# Patient Record
Sex: Female | Born: 1945
Health system: Southern US, Community
[De-identification: ages and names within clinical notes are randomized; demographics above are authoritative.]

## PROBLEM LIST (undated history)

## (undated) ENCOUNTER — Ambulatory Visit: Admission: EM | Payer: Medicare Other | Source: Home / Self Care

## (undated) DIAGNOSIS — I10 Essential (primary) hypertension: Secondary | ICD-10-CM

## (undated) DIAGNOSIS — N289 Disorder of kidney and ureter, unspecified: Secondary | ICD-10-CM

## (undated) DIAGNOSIS — E119 Type 2 diabetes mellitus without complications: Secondary | ICD-10-CM

## (undated) HISTORY — PX: CHOLECYSTECTOMY: SHX55

## (undated) HISTORY — PX: AMPUTATION TOE: SHX6595

---

## 1998-10-24 ENCOUNTER — Other Ambulatory Visit: Admission: RE | Admit: 1998-10-24 | Discharge: 1998-10-24 | Payer: Self-pay | Admitting: *Deleted

## 2005-07-27 ENCOUNTER — Encounter: Admission: RE | Admit: 2005-07-27 | Discharge: 2005-07-27 | Payer: Self-pay | Admitting: Occupational Medicine

## 2009-04-27 ENCOUNTER — Inpatient Hospital Stay (HOSPITAL_COMMUNITY): Admission: EM | Admit: 2009-04-27 | Discharge: 2009-05-12 | Payer: Self-pay | Admitting: Emergency Medicine

## 2009-04-28 ENCOUNTER — Encounter (INDEPENDENT_AMBULATORY_CARE_PROVIDER_SITE_OTHER): Payer: Self-pay | Admitting: General Surgery

## 2009-05-02 ENCOUNTER — Encounter (INDEPENDENT_AMBULATORY_CARE_PROVIDER_SITE_OTHER): Payer: Self-pay | Admitting: General Surgery

## 2010-08-08 ENCOUNTER — Ambulatory Visit (HOSPITAL_COMMUNITY)
Admission: RE | Admit: 2010-08-08 | Discharge: 2010-08-08 | Payer: Self-pay | Source: Home / Self Care | Attending: Internal Medicine | Admitting: Internal Medicine

## 2010-08-18 ENCOUNTER — Encounter
Admission: RE | Admit: 2010-08-18 | Discharge: 2010-08-18 | Payer: Self-pay | Source: Home / Self Care | Attending: Internal Medicine | Admitting: Internal Medicine

## 2010-11-02 LAB — CBC
HCT: 27.3 % — ABNORMAL LOW (ref 36.0–46.0)
HCT: 27.6 % — ABNORMAL LOW (ref 36.0–46.0)
HCT: 29.3 % — ABNORMAL LOW (ref 36.0–46.0)
HCT: 29.8 % — ABNORMAL LOW (ref 36.0–46.0)
Hemoglobin: 9.3 g/dL — ABNORMAL LOW (ref 12.0–15.0)
Hemoglobin: 9.5 g/dL — ABNORMAL LOW (ref 12.0–15.0)
Hemoglobin: 9.5 g/dL — ABNORMAL LOW (ref 12.0–15.0)
MCHC: 34.1 g/dL (ref 30.0–36.0)
MCHC: 34.2 g/dL (ref 30.0–36.0)
MCHC: 34.4 g/dL (ref 30.0–36.0)
MCHC: 34.6 g/dL (ref 30.0–36.0)
MCHC: 35.1 g/dL (ref 30.0–36.0)
MCV: 87.1 fL (ref 78.0–100.0)
MCV: 87.6 fL (ref 78.0–100.0)
MCV: 87.7 fL (ref 78.0–100.0)
MCV: 88.3 fL (ref 78.0–100.0)
MCV: 88.6 fL (ref 78.0–100.0)
Platelets: 261 10*3/uL (ref 150–400)
Platelets: 433 10*3/uL — ABNORMAL HIGH (ref 150–400)
Platelets: 459 10*3/uL — ABNORMAL HIGH (ref 150–400)
Platelets: 514 10*3/uL — ABNORMAL HIGH (ref 150–400)
Platelets: 582 10*3/uL — ABNORMAL HIGH (ref 150–400)
RBC: 3.11 MIL/uL — ABNORMAL LOW (ref 3.87–5.11)
RBC: 3.15 MIL/uL — ABNORMAL LOW (ref 3.87–5.11)
RBC: 3.32 MIL/uL — ABNORMAL LOW (ref 3.87–5.11)
RBC: 3.39 MIL/uL — ABNORMAL LOW (ref 3.87–5.11)
RDW: 12 % (ref 11.5–15.5)
RDW: 12.6 % (ref 11.5–15.5)
RDW: 12.9 % (ref 11.5–15.5)
RDW: 12.9 % (ref 11.5–15.5)
WBC: 10.5 10*3/uL (ref 4.0–10.5)
WBC: 10.6 10*3/uL — ABNORMAL HIGH (ref 4.0–10.5)
WBC: 14.1 10*3/uL — ABNORMAL HIGH (ref 4.0–10.5)
WBC: 14.7 10*3/uL — ABNORMAL HIGH (ref 4.0–10.5)

## 2010-11-02 LAB — BASIC METABOLIC PANEL
BUN: 13 mg/dL (ref 6–23)
BUN: 18 mg/dL (ref 6–23)
BUN: 4 mg/dL — ABNORMAL LOW (ref 6–23)
BUN: 4 mg/dL — ABNORMAL LOW (ref 6–23)
BUN: 6 mg/dL (ref 6–23)
BUN: 7 mg/dL (ref 6–23)
BUN: 9 mg/dL (ref 6–23)
CO2: 27 mEq/L (ref 19–32)
CO2: 27 mEq/L (ref 19–32)
CO2: 27 mEq/L (ref 19–32)
CO2: 28 mEq/L (ref 19–32)
CO2: 28 mEq/L (ref 19–32)
CO2: 29 mEq/L (ref 19–32)
Calcium: 7.9 mg/dL — ABNORMAL LOW (ref 8.4–10.5)
Calcium: 8.1 mg/dL — ABNORMAL LOW (ref 8.4–10.5)
Calcium: 8.4 mg/dL (ref 8.4–10.5)
Calcium: 9.3 mg/dL (ref 8.4–10.5)
Chloride: 101 mEq/L (ref 96–112)
Chloride: 102 mEq/L (ref 96–112)
Chloride: 103 mEq/L (ref 96–112)
Chloride: 97 mEq/L (ref 96–112)
Creatinine, Ser: 0.93 mg/dL (ref 0.4–1.2)
Creatinine, Ser: 0.93 mg/dL (ref 0.4–1.2)
Creatinine, Ser: 0.95 mg/dL (ref 0.4–1.2)
Creatinine, Ser: 0.96 mg/dL (ref 0.4–1.2)
Creatinine, Ser: 1.04 mg/dL (ref 0.4–1.2)
GFR calc Af Amer: >60 mL/min (ref >60)
GFR calc Af Amer: >60 mL/min (ref >60)
GFR calc non Af Amer: >60 mL/min (ref >60)
GFR calc non Af Amer: >60 mL/min (ref >60)
GFR calc non Af Amer: >60 mL/min (ref >60)
GFR calc non Af Amer: >60 mL/min (ref >60)
GFR calc non Af Amer: >60 mL/min (ref >60)
Glucose, Bld: 136 mg/dL — ABNORMAL HIGH (ref 70–99)
Glucose, Bld: 144 mg/dL — ABNORMAL HIGH (ref 70–99)
Glucose, Bld: 168 mg/dL — ABNORMAL HIGH (ref 70–99)
Glucose, Bld: 168 mg/dL — ABNORMAL HIGH (ref 70–99)
Glucose, Bld: 248 mg/dL — ABNORMAL HIGH (ref 70–99)
Potassium: 3.8 mEq/L (ref 3.5–5.1)
Potassium: 3.9 mEq/L (ref 3.5–5.1)
Potassium: 3.9 mEq/L (ref 3.5–5.1)
Potassium: 4 mEq/L (ref 3.5–5.1)
Potassium: 4.2 mEq/L (ref 3.5–5.1)
Sodium: 130 mEq/L — ABNORMAL LOW (ref 135–145)
Sodium: 134 mEq/L — ABNORMAL LOW (ref 135–145)
Sodium: 137 mEq/L (ref 135–145)

## 2010-11-02 LAB — DIFFERENTIAL
Basophils Absolute: 0 10*3/uL (ref 0.0–0.1)
Basophils Absolute: 0 10*3/uL (ref 0.0–0.1)
Basophils Relative: 0 % (ref 0–1)
Basophils Relative: 0 % (ref 0–1)
Basophils Relative: 0 % (ref 0–1)
Basophils Relative: 0 % (ref 0–1)
Basophils Relative: 0 % (ref 0–1)
Basophils Relative: 1 % (ref 0–1)
Eosinophils Absolute: 0.3 10*3/uL (ref 0.0–0.7)
Eosinophils Absolute: 0.3 10*3/uL (ref 0.0–0.7)
Eosinophils Absolute: 0.4 10*3/uL (ref 0.0–0.7)
Eosinophils Absolute: 0.7 10*3/uL (ref 0.0–0.7)
Eosinophils Absolute: 0.7 10*3/uL (ref 0.0–0.7)
Eosinophils Relative: 4 % (ref 0–5)
Eosinophils Relative: 4 % (ref 0–5)
Eosinophils Relative: 6 % — ABNORMAL HIGH (ref 0–5)
Eosinophils Relative: 7 % — ABNORMAL HIGH (ref 0–5)
Lymphocytes Relative: 19 % (ref 12–46)
Lymphocytes Relative: 22 % (ref 12–46)
Lymphocytes Relative: 23 % (ref 12–46)
Lymphocytes Relative: 25 % (ref 12–46)
Lymphs Abs: 2.8 10*3/uL (ref 0.7–4.0)
Lymphs Abs: 3.1 10*3/uL (ref 0.7–4.0)
Lymphs Abs: 3.2 10*3/uL (ref 0.7–4.0)
Lymphs Abs: 3.6 10*3/uL (ref 0.7–4.0)
Monocytes Absolute: 1.4 10*3/uL — ABNORMAL HIGH (ref 0.1–1.0)
Monocytes Absolute: 1.4 10*3/uL — ABNORMAL HIGH (ref 0.1–1.0)
Monocytes Absolute: 1.4 10*3/uL — ABNORMAL HIGH (ref 0.1–1.0)
Monocytes Absolute: 1.6 10*3/uL — ABNORMAL HIGH (ref 0.1–1.0)
Monocytes Relative: 10 % (ref 3–12)
Monocytes Relative: 11 % (ref 3–12)
Monocytes Relative: 12 % (ref 3–12)
Monocytes Relative: 9 % (ref 3–12)
Neutro Abs: 5.8 10*3/uL (ref 1.7–7.7)
Neutro Abs: 8.4 10*3/uL — ABNORMAL HIGH (ref 1.7–7.7)
Neutro Abs: 8.9 10*3/uL — ABNORMAL HIGH (ref 1.7–7.7)
Neutro Abs: 9.8 10*3/uL — ABNORMAL HIGH (ref 1.7–7.7)
Neutrophils Relative %: 55 % (ref 43–77)
Neutrophils Relative %: 61 % (ref 43–77)
Neutrophils Relative %: 61 % (ref 43–77)
Neutrophils Relative %: 62 % (ref 43–77)
Neutrophils Relative %: 63 % (ref 43–77)
Neutrophils Relative %: 80 % — ABNORMAL HIGH (ref 43–77)

## 2010-11-02 LAB — GLUCOSE, CAPILLARY
Glucose-Capillary: 101 mg/dL — ABNORMAL HIGH (ref 70–99)
Glucose-Capillary: 110 mg/dL — ABNORMAL HIGH (ref 70–99)
Glucose-Capillary: 110 mg/dL — ABNORMAL HIGH (ref 70–99)
Glucose-Capillary: 113 mg/dL — ABNORMAL HIGH (ref 70–99)
Glucose-Capillary: 117 mg/dL — ABNORMAL HIGH (ref 70–99)
Glucose-Capillary: 121 mg/dL — ABNORMAL HIGH (ref 70–99)
Glucose-Capillary: 122 mg/dL — ABNORMAL HIGH (ref 70–99)
Glucose-Capillary: 122 mg/dL — ABNORMAL HIGH (ref 70–99)
Glucose-Capillary: 125 mg/dL — ABNORMAL HIGH (ref 70–99)
Glucose-Capillary: 130 mg/dL — ABNORMAL HIGH (ref 70–99)
Glucose-Capillary: 134 mg/dL — ABNORMAL HIGH (ref 70–99)
Glucose-Capillary: 139 mg/dL — ABNORMAL HIGH (ref 70–99)
Glucose-Capillary: 141 mg/dL — ABNORMAL HIGH (ref 70–99)
Glucose-Capillary: 150 mg/dL — ABNORMAL HIGH (ref 70–99)
Glucose-Capillary: 158 mg/dL — ABNORMAL HIGH (ref 70–99)
Glucose-Capillary: 162 mg/dL — ABNORMAL HIGH (ref 70–99)
Glucose-Capillary: 165 mg/dL — ABNORMAL HIGH (ref 70–99)
Glucose-Capillary: 169 mg/dL — ABNORMAL HIGH (ref 70–99)
Glucose-Capillary: 173 mg/dL — ABNORMAL HIGH (ref 70–99)
Glucose-Capillary: 176 mg/dL — ABNORMAL HIGH (ref 70–99)
Glucose-Capillary: 177 mg/dL — ABNORMAL HIGH (ref 70–99)
Glucose-Capillary: 180 mg/dL — ABNORMAL HIGH (ref 70–99)
Glucose-Capillary: 185 mg/dL — ABNORMAL HIGH (ref 70–99)
Glucose-Capillary: 196 mg/dL — ABNORMAL HIGH (ref 70–99)
Glucose-Capillary: 211 mg/dL — ABNORMAL HIGH (ref 70–99)
Glucose-Capillary: 225 mg/dL — ABNORMAL HIGH (ref 70–99)
Glucose-Capillary: 72 mg/dL (ref 70–99)
Glucose-Capillary: 76 mg/dL (ref 70–99)
Glucose-Capillary: 82 mg/dL (ref 70–99)
Glucose-Capillary: 91 mg/dL (ref 70–99)
Glucose-Capillary: 94 mg/dL (ref 70–99)

## 2010-11-02 LAB — CROSSMATCH

## 2010-11-02 LAB — HEPATIC FUNCTION PANEL
Albumin: 1.9 g/dL — ABNORMAL LOW (ref 3.5–5.2)
Albumin: 2.5 g/dL — ABNORMAL LOW (ref 3.5–5.2)
Alkaline Phosphatase: 48 U/L (ref 39–117)
Indirect Bilirubin: 0.5 mg/dL (ref 0.3–0.9)
Total Bilirubin: 0.7 mg/dL (ref 0.3–1.2)
Total Protein: 5.5 g/dL — ABNORMAL LOW (ref 6.0–8.3)
Total Protein: 7.2 g/dL (ref 6.0–8.3)

## 2010-11-02 LAB — ABO/RH: ABO/RH(D): A POS

## 2010-11-02 LAB — HEMOGLOBIN AND HEMATOCRIT, BLOOD
HCT: 30.5 % — ABNORMAL LOW (ref 36.0–46.0)
Hemoglobin: 10.6 g/dL — ABNORMAL LOW (ref 12.0–15.0)

## 2010-11-03 LAB — GLUCOSE, CAPILLARY
Glucose-Capillary: 205 mg/dL — ABNORMAL HIGH (ref 70–99)
Glucose-Capillary: 266 mg/dL — ABNORMAL HIGH (ref 70–99)
Glucose-Capillary: 269 mg/dL — ABNORMAL HIGH (ref 70–99)
Glucose-Capillary: 281 mg/dL — ABNORMAL HIGH (ref 70–99)
Glucose-Capillary: 297 mg/dL — ABNORMAL HIGH (ref 70–99)
Glucose-Capillary: 304 mg/dL — ABNORMAL HIGH (ref 70–99)
Glucose-Capillary: 356 mg/dL — ABNORMAL HIGH (ref 70–99)
Glucose-Capillary: 366 mg/dL — ABNORMAL HIGH (ref 70–99)
Glucose-Capillary: 393 mg/dL — ABNORMAL HIGH (ref 70–99)

## 2010-11-03 LAB — BASIC METABOLIC PANEL
BUN: 13 mg/dL (ref 6–23)
BUN: 17 mg/dL (ref 6–23)
Chloride: 96 mEq/L (ref 96–112)
GFR calc non Af Amer: 52 mL/min — ABNORMAL LOW (ref >60)
GFR calc non Af Amer: 58 mL/min — ABNORMAL LOW (ref >60)
Potassium: 3.7 mEq/L (ref 3.5–5.1)
Potassium: 4.4 mEq/L (ref 3.5–5.1)
Sodium: 130 mEq/L — ABNORMAL LOW (ref 135–145)
Sodium: 130 mEq/L — ABNORMAL LOW (ref 135–145)

## 2010-11-03 LAB — DIFFERENTIAL
Eosinophils Absolute: 0 10*3/uL (ref 0.0–0.7)
Eosinophils Relative: 0 % (ref 0–5)
Eosinophils Relative: 0 % (ref 0–5)
Lymphocytes Relative: 6 % — ABNORMAL LOW (ref 12–46)
Lymphocytes Relative: 6 % — ABNORMAL LOW (ref 12–46)
Lymphs Abs: 1.2 10*3/uL (ref 0.7–4.0)
Lymphs Abs: 1.5 10*3/uL (ref 0.7–4.0)
Monocytes Absolute: 1.8 10*3/uL — ABNORMAL HIGH (ref 0.1–1.0)
Monocytes Relative: 7 % (ref 3–12)

## 2010-11-03 LAB — WOUND CULTURE

## 2010-11-03 LAB — CULTURE, BLOOD (ROUTINE X 2)
Culture: NO GROWTH
Report Status: 10042010
Report Status: 10042010

## 2010-11-03 LAB — CBC
HCT: 34.5 % — ABNORMAL LOW (ref 36.0–46.0)
HCT: 38.3 % (ref 36.0–46.0)
Hemoglobin: 12.3 g/dL (ref 12.0–15.0)
Hemoglobin: 13.1 g/dL (ref 12.0–15.0)
MCV: 87.4 fL (ref 78.0–100.0)
Platelets: 269 10*3/uL (ref 150–400)
Platelets: 294 10*3/uL (ref 150–400)
RBC: 3.95 MIL/uL (ref 3.87–5.11)
WBC: 20.7 10*3/uL — ABNORMAL HIGH (ref 4.0–10.5)
WBC: 26.6 10*3/uL — ABNORMAL HIGH (ref 4.0–10.5)

## 2010-11-03 LAB — HEMOGLOBIN A1C: Hgb A1c MFr Bld: 13.4 % — ABNORMAL HIGH (ref 4.6–6.1)

## 2010-11-03 LAB — ANAEROBIC CULTURE

## 2011-04-06 ENCOUNTER — Ambulatory Visit (INDEPENDENT_AMBULATORY_CARE_PROVIDER_SITE_OTHER): Payer: Commercial Managed Care - PPO | Admitting: Ophthalmology

## 2011-04-06 DIAGNOSIS — E11319 Type 2 diabetes mellitus with unspecified diabetic retinopathy without macular edema: Secondary | ICD-10-CM

## 2011-04-06 DIAGNOSIS — H43819 Vitreous degeneration, unspecified eye: Secondary | ICD-10-CM

## 2011-04-06 DIAGNOSIS — H35379 Puckering of macula, unspecified eye: Secondary | ICD-10-CM

## 2011-04-06 DIAGNOSIS — H2510 Age-related nuclear cataract, unspecified eye: Secondary | ICD-10-CM

## 2011-09-10 ENCOUNTER — Other Ambulatory Visit (HOSPITAL_COMMUNITY): Payer: Self-pay | Admitting: Internal Medicine

## 2011-09-10 DIAGNOSIS — Z139 Encounter for screening, unspecified: Secondary | ICD-10-CM

## 2011-09-21 ENCOUNTER — Ambulatory Visit (HOSPITAL_COMMUNITY): Payer: Commercial Managed Care - PPO

## 2011-09-28 ENCOUNTER — Ambulatory Visit (HOSPITAL_COMMUNITY)
Admission: RE | Admit: 2011-09-28 | Discharge: 2011-09-28 | Disposition: A | Discharge status: Home / Self Care | Payer: Commercial Managed Care - PPO | Source: Ambulatory Visit | Attending: Internal Medicine | Admitting: Internal Medicine

## 2011-09-28 DIAGNOSIS — Z139 Encounter for screening, unspecified: Secondary | ICD-10-CM

## 2011-09-28 DIAGNOSIS — Z1231 Encounter for screening mammogram for malignant neoplasm of breast: Secondary | ICD-10-CM | POA: Insufficient documentation

## 2011-10-05 ENCOUNTER — Ambulatory Visit (INDEPENDENT_AMBULATORY_CARE_PROVIDER_SITE_OTHER): Payer: Commercial Managed Care - PPO | Admitting: Ophthalmology

## 2011-10-05 DIAGNOSIS — E1139 Type 2 diabetes mellitus with other diabetic ophthalmic complication: Secondary | ICD-10-CM

## 2011-10-05 DIAGNOSIS — H2510 Age-related nuclear cataract, unspecified eye: Secondary | ICD-10-CM

## 2011-10-05 DIAGNOSIS — H35379 Puckering of macula, unspecified eye: Secondary | ICD-10-CM

## 2011-10-05 DIAGNOSIS — E11319 Type 2 diabetes mellitus with unspecified diabetic retinopathy without macular edema: Secondary | ICD-10-CM

## 2011-10-05 DIAGNOSIS — E1165 Type 2 diabetes mellitus with hyperglycemia: Secondary | ICD-10-CM

## 2011-10-05 DIAGNOSIS — H43819 Vitreous degeneration, unspecified eye: Secondary | ICD-10-CM

## 2012-04-11 ENCOUNTER — Ambulatory Visit (INDEPENDENT_AMBULATORY_CARE_PROVIDER_SITE_OTHER): Payer: Commercial Managed Care - PPO | Admitting: Ophthalmology

## 2012-04-11 DIAGNOSIS — H35379 Puckering of macula, unspecified eye: Secondary | ICD-10-CM

## 2012-04-11 DIAGNOSIS — E1139 Type 2 diabetes mellitus with other diabetic ophthalmic complication: Secondary | ICD-10-CM

## 2012-04-11 DIAGNOSIS — E1165 Type 2 diabetes mellitus with hyperglycemia: Secondary | ICD-10-CM

## 2012-04-11 DIAGNOSIS — H3581 Retinal edema: Secondary | ICD-10-CM

## 2012-04-11 DIAGNOSIS — H35039 Hypertensive retinopathy, unspecified eye: Secondary | ICD-10-CM

## 2012-04-11 DIAGNOSIS — I10 Essential (primary) hypertension: Secondary | ICD-10-CM

## 2012-04-11 DIAGNOSIS — E11319 Type 2 diabetes mellitus with unspecified diabetic retinopathy without macular edema: Secondary | ICD-10-CM

## 2012-08-22 ENCOUNTER — Ambulatory Visit (INDEPENDENT_AMBULATORY_CARE_PROVIDER_SITE_OTHER): Payer: Commercial Managed Care - PPO | Admitting: Ophthalmology

## 2012-08-22 DIAGNOSIS — H2510 Age-related nuclear cataract, unspecified eye: Secondary | ICD-10-CM

## 2012-08-22 DIAGNOSIS — H43819 Vitreous degeneration, unspecified eye: Secondary | ICD-10-CM

## 2012-08-22 DIAGNOSIS — E1165 Type 2 diabetes mellitus with hyperglycemia: Secondary | ICD-10-CM

## 2012-08-22 DIAGNOSIS — I10 Essential (primary) hypertension: Secondary | ICD-10-CM

## 2012-08-22 DIAGNOSIS — E11319 Type 2 diabetes mellitus with unspecified diabetic retinopathy without macular edema: Secondary | ICD-10-CM

## 2012-08-22 DIAGNOSIS — H35039 Hypertensive retinopathy, unspecified eye: Secondary | ICD-10-CM

## 2012-08-22 DIAGNOSIS — H3581 Retinal edema: Secondary | ICD-10-CM

## 2012-08-22 DIAGNOSIS — E1139 Type 2 diabetes mellitus with other diabetic ophthalmic complication: Secondary | ICD-10-CM

## 2012-12-23 ENCOUNTER — Ambulatory Visit (INDEPENDENT_AMBULATORY_CARE_PROVIDER_SITE_OTHER): Payer: Commercial Managed Care - PPO | Admitting: Ophthalmology

## 2012-12-23 DIAGNOSIS — H2510 Age-related nuclear cataract, unspecified eye: Secondary | ICD-10-CM

## 2012-12-23 DIAGNOSIS — E11319 Type 2 diabetes mellitus with unspecified diabetic retinopathy without macular edema: Secondary | ICD-10-CM

## 2012-12-23 DIAGNOSIS — I10 Essential (primary) hypertension: Secondary | ICD-10-CM

## 2012-12-23 DIAGNOSIS — H35039 Hypertensive retinopathy, unspecified eye: Secondary | ICD-10-CM

## 2012-12-23 DIAGNOSIS — H43819 Vitreous degeneration, unspecified eye: Secondary | ICD-10-CM

## 2012-12-23 DIAGNOSIS — E1165 Type 2 diabetes mellitus with hyperglycemia: Secondary | ICD-10-CM

## 2013-06-30 ENCOUNTER — Ambulatory Visit (INDEPENDENT_AMBULATORY_CARE_PROVIDER_SITE_OTHER): Payer: Medicare Other | Admitting: Ophthalmology

## 2013-06-30 DIAGNOSIS — E11319 Type 2 diabetes mellitus with unspecified diabetic retinopathy without macular edema: Secondary | ICD-10-CM

## 2013-06-30 DIAGNOSIS — H43819 Vitreous degeneration, unspecified eye: Secondary | ICD-10-CM

## 2013-06-30 DIAGNOSIS — E1139 Type 2 diabetes mellitus with other diabetic ophthalmic complication: Secondary | ICD-10-CM

## 2013-06-30 DIAGNOSIS — H35039 Hypertensive retinopathy, unspecified eye: Secondary | ICD-10-CM

## 2013-06-30 DIAGNOSIS — I10 Essential (primary) hypertension: Secondary | ICD-10-CM

## 2013-11-18 DIAGNOSIS — S53449A Ulnar collateral ligament sprain of unspecified elbow, initial encounter: Secondary | ICD-10-CM | POA: Diagnosis not present

## 2013-12-15 DIAGNOSIS — L821 Other seborrheic keratosis: Secondary | ICD-10-CM | POA: Diagnosis not present

## 2013-12-15 DIAGNOSIS — C44519 Basal cell carcinoma of skin of other part of trunk: Secondary | ICD-10-CM | POA: Diagnosis not present

## 2013-12-15 DIAGNOSIS — D235 Other benign neoplasm of skin of trunk: Secondary | ICD-10-CM | POA: Diagnosis not present

## 2013-12-15 DIAGNOSIS — D485 Neoplasm of uncertain behavior of skin: Secondary | ICD-10-CM | POA: Diagnosis not present

## 2013-12-23 DIAGNOSIS — C44519 Basal cell carcinoma of skin of other part of trunk: Secondary | ICD-10-CM | POA: Diagnosis not present

## 2013-12-29 ENCOUNTER — Ambulatory Visit (INDEPENDENT_AMBULATORY_CARE_PROVIDER_SITE_OTHER): Payer: Medicare Other | Admitting: Ophthalmology

## 2014-01-05 ENCOUNTER — Ambulatory Visit (INDEPENDENT_AMBULATORY_CARE_PROVIDER_SITE_OTHER): Payer: Medicare Other | Admitting: Ophthalmology

## 2014-01-05 DIAGNOSIS — E11319 Type 2 diabetes mellitus with unspecified diabetic retinopathy without macular edema: Secondary | ICD-10-CM | POA: Diagnosis not present

## 2014-01-05 DIAGNOSIS — H43819 Vitreous degeneration, unspecified eye: Secondary | ICD-10-CM

## 2014-01-05 DIAGNOSIS — I10 Essential (primary) hypertension: Secondary | ICD-10-CM | POA: Diagnosis not present

## 2014-01-05 DIAGNOSIS — H35039 Hypertensive retinopathy, unspecified eye: Secondary | ICD-10-CM | POA: Diagnosis not present

## 2014-01-05 DIAGNOSIS — E1139 Type 2 diabetes mellitus with other diabetic ophthalmic complication: Secondary | ICD-10-CM | POA: Diagnosis not present

## 2014-01-05 DIAGNOSIS — E1165 Type 2 diabetes mellitus with hyperglycemia: Secondary | ICD-10-CM | POA: Diagnosis not present

## 2014-01-05 DIAGNOSIS — H2510 Age-related nuclear cataract, unspecified eye: Secondary | ICD-10-CM

## 2014-06-29 ENCOUNTER — Ambulatory Visit (INDEPENDENT_AMBULATORY_CARE_PROVIDER_SITE_OTHER): Payer: Medicare Other | Admitting: Ophthalmology

## 2014-06-29 DIAGNOSIS — E11319 Type 2 diabetes mellitus with unspecified diabetic retinopathy without macular edema: Secondary | ICD-10-CM

## 2014-06-29 DIAGNOSIS — I10 Essential (primary) hypertension: Secondary | ICD-10-CM | POA: Diagnosis not present

## 2014-06-29 DIAGNOSIS — E11339 Type 2 diabetes mellitus with moderate nonproliferative diabetic retinopathy without macular edema: Secondary | ICD-10-CM

## 2014-06-29 DIAGNOSIS — H43813 Vitreous degeneration, bilateral: Secondary | ICD-10-CM | POA: Diagnosis not present

## 2014-06-29 DIAGNOSIS — H35033 Hypertensive retinopathy, bilateral: Secondary | ICD-10-CM

## 2014-06-30 DIAGNOSIS — D485 Neoplasm of uncertain behavior of skin: Secondary | ICD-10-CM | POA: Diagnosis not present

## 2014-06-30 DIAGNOSIS — D2262 Melanocytic nevi of left upper limb, including shoulder: Secondary | ICD-10-CM | POA: Diagnosis not present

## 2014-06-30 DIAGNOSIS — Z85828 Personal history of other malignant neoplasm of skin: Secondary | ICD-10-CM | POA: Diagnosis not present

## 2014-06-30 DIAGNOSIS — L570 Actinic keratosis: Secondary | ICD-10-CM | POA: Diagnosis not present

## 2014-07-07 ENCOUNTER — Ambulatory Visit (INDEPENDENT_AMBULATORY_CARE_PROVIDER_SITE_OTHER): Payer: Medicare Other | Admitting: Ophthalmology

## 2014-10-12 DIAGNOSIS — E119 Type 2 diabetes mellitus without complications: Secondary | ICD-10-CM | POA: Diagnosis not present

## 2014-10-12 DIAGNOSIS — E782 Mixed hyperlipidemia: Secondary | ICD-10-CM | POA: Diagnosis not present

## 2014-10-15 DIAGNOSIS — E782 Mixed hyperlipidemia: Secondary | ICD-10-CM | POA: Diagnosis not present

## 2014-10-15 DIAGNOSIS — R809 Proteinuria, unspecified: Secondary | ICD-10-CM | POA: Diagnosis not present

## 2014-10-15 DIAGNOSIS — E1122 Type 2 diabetes mellitus with diabetic chronic kidney disease: Secondary | ICD-10-CM | POA: Diagnosis not present

## 2014-10-15 DIAGNOSIS — N183 Chronic kidney disease, stage 3 (moderate): Secondary | ICD-10-CM | POA: Diagnosis not present

## 2014-12-22 DIAGNOSIS — E1165 Type 2 diabetes mellitus with hyperglycemia: Secondary | ICD-10-CM | POA: Diagnosis not present

## 2014-12-22 DIAGNOSIS — G4700 Insomnia, unspecified: Secondary | ICD-10-CM | POA: Diagnosis not present

## 2014-12-22 DIAGNOSIS — E119 Type 2 diabetes mellitus without complications: Secondary | ICD-10-CM | POA: Diagnosis not present

## 2014-12-22 DIAGNOSIS — I10 Essential (primary) hypertension: Secondary | ICD-10-CM | POA: Diagnosis not present

## 2014-12-29 ENCOUNTER — Ambulatory Visit (INDEPENDENT_AMBULATORY_CARE_PROVIDER_SITE_OTHER): Payer: Medicare Other | Admitting: Ophthalmology

## 2015-01-03 DIAGNOSIS — L821 Other seborrheic keratosis: Secondary | ICD-10-CM | POA: Diagnosis not present

## 2015-01-03 DIAGNOSIS — L570 Actinic keratosis: Secondary | ICD-10-CM | POA: Diagnosis not present

## 2015-01-03 DIAGNOSIS — Z85828 Personal history of other malignant neoplasm of skin: Secondary | ICD-10-CM | POA: Diagnosis not present

## 2015-01-11 ENCOUNTER — Ambulatory Visit (INDEPENDENT_AMBULATORY_CARE_PROVIDER_SITE_OTHER): Payer: Medicare Other | Admitting: Ophthalmology

## 2015-01-11 DIAGNOSIS — H35033 Hypertensive retinopathy, bilateral: Secondary | ICD-10-CM | POA: Diagnosis not present

## 2015-01-11 DIAGNOSIS — H43813 Vitreous degeneration, bilateral: Secondary | ICD-10-CM

## 2015-01-11 DIAGNOSIS — E11319 Type 2 diabetes mellitus with unspecified diabetic retinopathy without macular edema: Secondary | ICD-10-CM

## 2015-01-11 DIAGNOSIS — H2513 Age-related nuclear cataract, bilateral: Secondary | ICD-10-CM

## 2015-01-11 DIAGNOSIS — I10 Essential (primary) hypertension: Secondary | ICD-10-CM

## 2015-01-11 DIAGNOSIS — E11339 Type 2 diabetes mellitus with moderate nonproliferative diabetic retinopathy without macular edema: Secondary | ICD-10-CM | POA: Diagnosis not present

## 2015-02-16 DIAGNOSIS — I10 Essential (primary) hypertension: Secondary | ICD-10-CM | POA: Diagnosis not present

## 2015-02-16 DIAGNOSIS — E1165 Type 2 diabetes mellitus with hyperglycemia: Secondary | ICD-10-CM | POA: Diagnosis not present

## 2015-02-16 DIAGNOSIS — E782 Mixed hyperlipidemia: Secondary | ICD-10-CM | POA: Diagnosis not present

## 2015-02-23 DIAGNOSIS — I10 Essential (primary) hypertension: Secondary | ICD-10-CM | POA: Diagnosis not present

## 2015-02-23 DIAGNOSIS — E119 Type 2 diabetes mellitus without complications: Secondary | ICD-10-CM | POA: Diagnosis not present

## 2015-03-02 DIAGNOSIS — E1165 Type 2 diabetes mellitus with hyperglycemia: Secondary | ICD-10-CM | POA: Diagnosis not present

## 2015-03-30 DIAGNOSIS — E1359 Other specified diabetes mellitus with other circulatory complications: Secondary | ICD-10-CM | POA: Diagnosis not present

## 2015-03-30 DIAGNOSIS — R2689 Other abnormalities of gait and mobility: Secondary | ICD-10-CM | POA: Diagnosis not present

## 2015-03-30 DIAGNOSIS — L602 Onychogryphosis: Secondary | ICD-10-CM | POA: Diagnosis not present

## 2015-03-30 DIAGNOSIS — L608 Other nail disorders: Secondary | ICD-10-CM | POA: Diagnosis not present

## 2015-03-30 DIAGNOSIS — B351 Tinea unguium: Secondary | ICD-10-CM | POA: Diagnosis not present

## 2015-04-21 DIAGNOSIS — H5213 Myopia, bilateral: Secondary | ICD-10-CM | POA: Diagnosis not present

## 2015-04-21 DIAGNOSIS — E11339 Type 2 diabetes mellitus with moderate nonproliferative diabetic retinopathy without macular edema: Secondary | ICD-10-CM | POA: Diagnosis not present

## 2015-04-21 DIAGNOSIS — H52223 Regular astigmatism, bilateral: Secondary | ICD-10-CM | POA: Diagnosis not present

## 2015-04-21 DIAGNOSIS — Z794 Long term (current) use of insulin: Secondary | ICD-10-CM | POA: Diagnosis not present

## 2015-05-25 DIAGNOSIS — E1165 Type 2 diabetes mellitus with hyperglycemia: Secondary | ICD-10-CM | POA: Diagnosis not present

## 2015-06-08 DIAGNOSIS — E782 Mixed hyperlipidemia: Secondary | ICD-10-CM | POA: Diagnosis not present

## 2015-06-08 DIAGNOSIS — E1122 Type 2 diabetes mellitus with diabetic chronic kidney disease: Secondary | ICD-10-CM | POA: Diagnosis not present

## 2015-06-08 DIAGNOSIS — I10 Essential (primary) hypertension: Secondary | ICD-10-CM | POA: Diagnosis not present

## 2015-06-08 DIAGNOSIS — F411 Generalized anxiety disorder: Secondary | ICD-10-CM | POA: Diagnosis not present

## 2015-06-08 DIAGNOSIS — Z23 Encounter for immunization: Secondary | ICD-10-CM | POA: Diagnosis not present

## 2015-06-15 DIAGNOSIS — R944 Abnormal results of kidney function studies: Secondary | ICD-10-CM | POA: Diagnosis not present

## 2015-07-13 ENCOUNTER — Ambulatory Visit (INDEPENDENT_AMBULATORY_CARE_PROVIDER_SITE_OTHER): Payer: Medicare Other | Admitting: Ophthalmology

## 2015-07-13 DIAGNOSIS — E113393 Type 2 diabetes mellitus with moderate nonproliferative diabetic retinopathy without macular edema, bilateral: Secondary | ICD-10-CM

## 2015-07-13 DIAGNOSIS — H43813 Vitreous degeneration, bilateral: Secondary | ICD-10-CM

## 2015-07-13 DIAGNOSIS — I10 Essential (primary) hypertension: Secondary | ICD-10-CM

## 2015-07-13 DIAGNOSIS — H2513 Age-related nuclear cataract, bilateral: Secondary | ICD-10-CM | POA: Diagnosis not present

## 2015-07-13 DIAGNOSIS — H35033 Hypertensive retinopathy, bilateral: Secondary | ICD-10-CM | POA: Diagnosis not present

## 2015-07-13 DIAGNOSIS — E11319 Type 2 diabetes mellitus with unspecified diabetic retinopathy without macular edema: Secondary | ICD-10-CM

## 2015-07-20 DIAGNOSIS — I10 Essential (primary) hypertension: Secondary | ICD-10-CM | POA: Diagnosis not present

## 2015-07-20 DIAGNOSIS — F411 Generalized anxiety disorder: Secondary | ICD-10-CM | POA: Diagnosis not present

## 2015-07-20 DIAGNOSIS — E1122 Type 2 diabetes mellitus with diabetic chronic kidney disease: Secondary | ICD-10-CM | POA: Diagnosis not present

## 2015-07-20 DIAGNOSIS — E119 Type 2 diabetes mellitus without complications: Secondary | ICD-10-CM | POA: Diagnosis not present

## 2015-07-20 DIAGNOSIS — E782 Mixed hyperlipidemia: Secondary | ICD-10-CM | POA: Diagnosis not present

## 2015-09-10 ENCOUNTER — Emergency Department (HOSPITAL_COMMUNITY)
Admission: EM | Admit: 2015-09-10 | Discharge: 2015-09-10 | Disposition: A | Discharge status: Home / Self Care | Payer: Medicare Other | Attending: Emergency Medicine | Admitting: Emergency Medicine

## 2015-09-10 ENCOUNTER — Encounter (HOSPITAL_COMMUNITY): Payer: Self-pay | Admitting: Emergency Medicine

## 2015-09-10 ENCOUNTER — Emergency Department (HOSPITAL_COMMUNITY): Payer: Medicare Other

## 2015-09-10 DIAGNOSIS — M1711 Unilateral primary osteoarthritis, right knee: Secondary | ICD-10-CM | POA: Diagnosis not present

## 2015-09-10 DIAGNOSIS — M179 Osteoarthritis of knee, unspecified: Secondary | ICD-10-CM | POA: Diagnosis not present

## 2015-09-10 DIAGNOSIS — E119 Type 2 diabetes mellitus without complications: Secondary | ICD-10-CM | POA: Diagnosis not present

## 2015-09-10 DIAGNOSIS — Z79899 Other long term (current) drug therapy: Secondary | ICD-10-CM | POA: Insufficient documentation

## 2015-09-10 DIAGNOSIS — E876 Hypokalemia: Secondary | ICD-10-CM | POA: Diagnosis not present

## 2015-09-10 DIAGNOSIS — Z794 Long term (current) use of insulin: Secondary | ICD-10-CM | POA: Insufficient documentation

## 2015-09-10 DIAGNOSIS — N289 Disorder of kidney and ureter, unspecified: Secondary | ICD-10-CM | POA: Diagnosis not present

## 2015-09-10 DIAGNOSIS — Z7982 Long term (current) use of aspirin: Secondary | ICD-10-CM | POA: Diagnosis not present

## 2015-09-10 DIAGNOSIS — M19071 Primary osteoarthritis, right ankle and foot: Secondary | ICD-10-CM | POA: Diagnosis not present

## 2015-09-10 DIAGNOSIS — I10 Essential (primary) hypertension: Secondary | ICD-10-CM | POA: Insufficient documentation

## 2015-09-10 DIAGNOSIS — M79604 Pain in right leg: Secondary | ICD-10-CM | POA: Diagnosis present

## 2015-09-10 HISTORY — DX: Essential (primary) hypertension: I10

## 2015-09-10 HISTORY — DX: Type 2 diabetes mellitus without complications: E11.9

## 2015-09-10 LAB — BASIC METABOLIC PANEL
Anion gap: 9 (ref 5–15)
BUN: 41 mg/dL — AB (ref 6–20)
CO2: 22 mmol/L (ref 22–32)
CREATININE: 1.87 mg/dL — AB (ref 0.44–1.00)
Calcium: 9.4 mg/dL (ref 8.9–10.3)
Chloride: 109 mmol/L (ref 101–111)
GFR calc Af Amer: 31 mL/min — ABNORMAL LOW (ref >60)
GFR, EST NON AFRICAN AMERICAN: 27 mL/min — AB (ref >60)
GLUCOSE: 86 mg/dL (ref 65–99)
POTASSIUM: 5.4 mmol/L — AB (ref 3.5–5.1)
SODIUM: 140 mmol/L (ref 135–145)

## 2015-09-10 LAB — CBC
HEMATOCRIT: 34.9 % — AB (ref 36.0–46.0)
Hemoglobin: 11.3 g/dL — ABNORMAL LOW (ref 12.0–15.0)
MCH: 29.7 pg (ref 26.0–34.0)
MCHC: 32.4 g/dL (ref 30.0–36.0)
MCV: 91.6 fL (ref 78.0–100.0)
Platelets: 257 10*3/uL (ref 150–400)
RBC: 3.81 MIL/uL — ABNORMAL LOW (ref 3.87–5.11)
RDW: 13.6 % (ref 11.5–15.5)
WBC: 8.5 10*3/uL (ref 4.0–10.5)

## 2015-09-10 LAB — D-DIMER, QUANTITATIVE: D-Dimer, Quant: 0.6 ug/mL-FEU — ABNORMAL HIGH (ref 0.00–0.50)

## 2015-09-10 MED ORDER — HYDROCODONE-ACETAMINOPHEN 5-325 MG PO TABS: 1.0000 | ORAL_TABLET | ORAL | Status: DC | PRN

## 2015-09-10 MED ORDER — HYDROCODONE-ACETAMINOPHEN 5-325 MG PO TABS
1.0000 | ORAL_TABLET | ORAL | Status: AC
  Administered 2015-09-10: 1 via ORAL
  Filled 2015-09-10: qty 1

## 2015-09-10 NOTE — ED Provider Notes (Signed)
CSN: [id number]     Arrival date & time 09/10/15  1721 History   First MD Initiated Contact with Patient 09/10/15 1951     Chief Complaint  Patient presents with  . Leg Swelling  . Leg Pain    HPI Patient presents to the emergency room with complaints of right leg pain. Symptoms started a few days ago. She's having sharp pain in the anterior aspect of her right lower leg below the knee. The pain increases with walking. She denies any swelling. She denies any fevers. No chest pain or shortness of breath. She does not recall any injuries. Patient has been taking Aleve but the pain persists. Past Medical History  Diagnosis Date  . Diabetes mellitus without complication ([place])   . Hypertension    Past Surgical History  Procedure Laterality Date  . Amputation toe     History reviewed. No pertinent family history. Social History  Substance Use Topics  . Smoking status: Never Smoker   . Smokeless tobacco: None  . Alcohol Use: No   OB History    No data available     Review of Systems  All other systems reviewed and are negative.     Allergies  Review of patient's allergies indicates no known allergies.  Home Medications   Prior to Admission medications   Medication Sig Start Date End Date Taking? Authorizing Provider  amLODipine-valsartan (EXFORGE) 10-160 MG tablet Take 1 tablet by mouth daily.   Yes Historical Provider, MD  aspirin EC 81 MG tablet Take 81 mg by mouth daily.   Yes Historical Provider, MD  insulin glargine (LANTUS) 100 UNIT/ML injection Inject 40 Units into the skin at bedtime.   Yes Historical Provider, MD  metoprolol (LOPRESSOR) 50 MG tablet Take 50 mg by mouth daily.   Yes Historical Provider, MD  Multiple Vitamins-Minerals (PRESERVISION AREDS PO) Take 1 tablet by mouth daily.   Yes Historical Provider, MD  naproxen sodium (ALEVE) 220 MG tablet Take 440 mg by mouth 2 (two) times daily as needed (Pain).   Yes Historical Provider, MD  pioglitazone (ACTOS) 15  MG tablet Take 15 mg by mouth daily.   Yes Historical Provider, MD  pravastatin (PRAVACHOL) 40 MG tablet Take 40 mg by mouth daily.   Yes Historical Provider, MD  HYDROcodone-acetaminophen (NORCO/VICODIN) 5-325 MG tablet Take 1 tablet by mouth every 4 (four) hours as needed. 09/10/15   Dorie Rank, MD   BP 151/53 mmHg  Pulse 57  Temp(Src) 98.1 F (36.7 C) (Oral)  Resp 16  Ht 5\' 5"  (1.651 m)  Wt 106.595 kg  BMI 39.11 kg/m2  SpO2 100% Physical Exam  Constitutional: She appears well-developed and well-nourished. No distress.  HENT:  Head: Normocephalic and atraumatic.  Right Ear: External ear normal.  Left Ear: External ear normal.  Eyes: Conjunctivae are normal. Right eye exhibits no discharge. Left eye exhibits no discharge. No scleral icterus.  Neck: Neck supple. No tracheal deviation present.  Cardiovascular: Normal rate.   Pulmonary/Chest: Effort normal. No stridor. No respiratory distress.  Musculoskeletal: She exhibits tenderness. She exhibits no edema.       Right lower leg: She exhibits bony tenderness. She exhibits no swelling, no edema and no deformity.  Tenderness to palpation in the anterior aspect of the right lower leg adjacent to the tibia, no calf tenderness, no swelling, extremities warm and well perfused  Neurological: She is alert. Cranial nerve deficit: no gross deficits.  Skin: Skin is warm and dry. No rash noted.  Psychiatric: She has a normal mood and affect.  Nursing note and vitals reviewed.   ED Course  Procedures (including critical care time) Labs Review Labs Reviewed  BASIC METABOLIC PANEL - Abnormal; Notable for the following:    Potassium 5.4 (*)    BUN 41 (*)    Creatinine, Ser 1.87 (*)    GFR calc non Af Amer 27 (*)    GFR calc Af Amer 31 (*)    All other components within normal limits  CBC - Abnormal; Notable for the following:    RBC 3.81 (*)    Hemoglobin 11.3 (*)    HCT 34.9 (*)    All other components within normal limits  D-DIMER,  QUANTITATIVE (NOT AT [facility]) - Abnormal; Notable for the following:    D-Dimer, Quant 0.60 (*)    All other components within normal limits    Imaging Review Dg Tibia/fibula Right  09/10/2015  CLINICAL DATA:  70 year old with 4 day history of pain involving the right mid lower leg. No known injury. EXAM: RIGHT TIBIA AND FIBULA - 2 VIEW COMPARISON:  None. FINDINGS: No evidence of acute, subacute or healed fractures. Mild osseous demineralization. No other intrinsic osseous abnormalities involving the tibia or fibula. Severe tricompartment osteoarthritis involving the knee. Mild degenerative changes involving the ankle. IMPRESSION: 1. No acute osseous abnormality. 2. Severe tricompartment osteoarthritis involving the right knee. Mild degenerative changes involving the right ankle. Electronically Signed   By: Evangeline Dakin M.D.   On: 09/10/2015 20:33   I have personally reviewed and evaluated these images and lab results as part of my medical decision-making.   EKG Interpretation None      MDM   Final diagnoses:  Osteoarthritis of right knee, unspecified osteoarthritis type  Renal insufficiency    Patient's laboratory tests show renal sufficiency and a mild hypokalemia. I don't have any recent labs for comparison. This is most likely chronic however I will have her cut back on taking Aleve as this could be contributing to renal sufficiency.  Patient's age adjusted d-dimer is negative. Her x-rays show osteoarthritis. I think her pain in her leg is related to the osteoarthritis findings. Plan on discharge home with prescription for hydrocodone.      Dorie Rank, MD 09/10/15 2137

## 2015-09-10 NOTE — ED Notes (Signed)
Pt states that her right leg has been swelling and hurting from the knee down for the past few days.  States it has become painful to walk on it.

## 2015-09-10 NOTE — Discharge Instructions (Signed)

## 2015-09-27 DIAGNOSIS — M79675 Pain in left toe(s): Secondary | ICD-10-CM | POA: Diagnosis not present

## 2015-09-27 DIAGNOSIS — S90212A Contusion of left great toe with damage to nail, initial encounter: Secondary | ICD-10-CM | POA: Diagnosis not present

## 2015-10-13 DIAGNOSIS — L03032 Cellulitis of left toe: Secondary | ICD-10-CM | POA: Diagnosis not present

## 2015-10-13 DIAGNOSIS — M79672 Pain in left foot: Secondary | ICD-10-CM | POA: Diagnosis not present

## 2015-10-13 DIAGNOSIS — L600 Ingrowing nail: Secondary | ICD-10-CM | POA: Diagnosis not present

## 2015-10-13 DIAGNOSIS — M79675 Pain in left toe(s): Secondary | ICD-10-CM | POA: Diagnosis not present

## 2015-12-29 DIAGNOSIS — L600 Ingrowing nail: Secondary | ICD-10-CM | POA: Diagnosis not present

## 2015-12-29 DIAGNOSIS — M79672 Pain in left foot: Secondary | ICD-10-CM | POA: Diagnosis not present

## 2015-12-29 DIAGNOSIS — L03032 Cellulitis of left toe: Secondary | ICD-10-CM | POA: Diagnosis not present

## 2015-12-29 DIAGNOSIS — M79675 Pain in left toe(s): Secondary | ICD-10-CM | POA: Diagnosis not present

## 2016-01-11 ENCOUNTER — Ambulatory Visit (INDEPENDENT_AMBULATORY_CARE_PROVIDER_SITE_OTHER): Payer: Medicare Other | Admitting: Ophthalmology

## 2016-01-11 DIAGNOSIS — H35033 Hypertensive retinopathy, bilateral: Secondary | ICD-10-CM

## 2016-01-11 DIAGNOSIS — E11319 Type 2 diabetes mellitus with unspecified diabetic retinopathy without macular edema: Secondary | ICD-10-CM

## 2016-01-11 DIAGNOSIS — H2513 Age-related nuclear cataract, bilateral: Secondary | ICD-10-CM | POA: Diagnosis not present

## 2016-01-11 DIAGNOSIS — I10 Essential (primary) hypertension: Secondary | ICD-10-CM | POA: Diagnosis not present

## 2016-01-11 DIAGNOSIS — E113393 Type 2 diabetes mellitus with moderate nonproliferative diabetic retinopathy without macular edema, bilateral: Secondary | ICD-10-CM

## 2016-01-11 DIAGNOSIS — H43813 Vitreous degeneration, bilateral: Secondary | ICD-10-CM | POA: Diagnosis not present

## 2016-07-13 ENCOUNTER — Ambulatory Visit (INDEPENDENT_AMBULATORY_CARE_PROVIDER_SITE_OTHER): Payer: Medicare Other | Admitting: Ophthalmology

## 2016-07-13 DIAGNOSIS — H35033 Hypertensive retinopathy, bilateral: Secondary | ICD-10-CM | POA: Diagnosis not present

## 2016-07-13 DIAGNOSIS — H2513 Age-related nuclear cataract, bilateral: Secondary | ICD-10-CM | POA: Diagnosis not present

## 2016-07-13 DIAGNOSIS — H43813 Vitreous degeneration, bilateral: Secondary | ICD-10-CM

## 2016-07-13 DIAGNOSIS — E113393 Type 2 diabetes mellitus with moderate nonproliferative diabetic retinopathy without macular edema, bilateral: Secondary | ICD-10-CM

## 2016-07-13 DIAGNOSIS — I10 Essential (primary) hypertension: Secondary | ICD-10-CM | POA: Diagnosis not present

## 2016-07-13 DIAGNOSIS — E11319 Type 2 diabetes mellitus with unspecified diabetic retinopathy without macular edema: Secondary | ICD-10-CM

## 2016-08-09 DIAGNOSIS — E782 Mixed hyperlipidemia: Secondary | ICD-10-CM | POA: Diagnosis not present

## 2016-08-09 DIAGNOSIS — E1122 Type 2 diabetes mellitus with diabetic chronic kidney disease: Secondary | ICD-10-CM | POA: Diagnosis not present

## 2016-08-13 DIAGNOSIS — E782 Mixed hyperlipidemia: Secondary | ICD-10-CM | POA: Diagnosis not present

## 2016-08-13 DIAGNOSIS — I10 Essential (primary) hypertension: Secondary | ICD-10-CM | POA: Diagnosis not present

## 2016-08-13 DIAGNOSIS — Z0001 Encounter for general adult medical examination with abnormal findings: Secondary | ICD-10-CM | POA: Diagnosis not present

## 2016-08-13 DIAGNOSIS — N183 Chronic kidney disease, stage 3 (moderate): Secondary | ICD-10-CM | POA: Diagnosis not present

## 2016-08-13 DIAGNOSIS — Z23 Encounter for immunization: Secondary | ICD-10-CM | POA: Diagnosis not present

## 2016-08-13 DIAGNOSIS — E1122 Type 2 diabetes mellitus with diabetic chronic kidney disease: Secondary | ICD-10-CM | POA: Diagnosis not present

## 2016-08-29 DIAGNOSIS — E119 Type 2 diabetes mellitus without complications: Secondary | ICD-10-CM | POA: Diagnosis not present

## 2016-08-29 DIAGNOSIS — E113213 Type 2 diabetes mellitus with mild nonproliferative diabetic retinopathy with macular edema, bilateral: Secondary | ICD-10-CM | POA: Diagnosis not present

## 2016-08-29 DIAGNOSIS — H25813 Combined forms of age-related cataract, bilateral: Secondary | ICD-10-CM | POA: Diagnosis not present

## 2016-08-29 DIAGNOSIS — H5213 Myopia, bilateral: Secondary | ICD-10-CM | POA: Diagnosis not present

## 2017-01-14 ENCOUNTER — Ambulatory Visit (INDEPENDENT_AMBULATORY_CARE_PROVIDER_SITE_OTHER): Payer: Medicare Other | Admitting: Ophthalmology

## 2017-01-21 ENCOUNTER — Ambulatory Visit (INDEPENDENT_AMBULATORY_CARE_PROVIDER_SITE_OTHER): Payer: Medicare Other | Admitting: Ophthalmology

## 2017-01-21 DIAGNOSIS — I10 Essential (primary) hypertension: Secondary | ICD-10-CM

## 2017-01-21 DIAGNOSIS — H43813 Vitreous degeneration, bilateral: Secondary | ICD-10-CM | POA: Diagnosis not present

## 2017-01-21 DIAGNOSIS — E10311 Type 1 diabetes mellitus with unspecified diabetic retinopathy with macular edema: Secondary | ICD-10-CM | POA: Diagnosis not present

## 2017-01-21 DIAGNOSIS — H35033 Hypertensive retinopathy, bilateral: Secondary | ICD-10-CM | POA: Diagnosis not present

## 2017-01-21 DIAGNOSIS — E113313 Type 2 diabetes mellitus with moderate nonproliferative diabetic retinopathy with macular edema, bilateral: Secondary | ICD-10-CM

## 2017-02-07 DIAGNOSIS — I10 Essential (primary) hypertension: Secondary | ICD-10-CM | POA: Diagnosis not present

## 2017-02-07 DIAGNOSIS — E1122 Type 2 diabetes mellitus with diabetic chronic kidney disease: Secondary | ICD-10-CM | POA: Diagnosis not present

## 2017-02-07 DIAGNOSIS — R601 Generalized edema: Secondary | ICD-10-CM | POA: Diagnosis not present

## 2017-02-07 DIAGNOSIS — R809 Proteinuria, unspecified: Secondary | ICD-10-CM | POA: Diagnosis not present

## 2017-02-07 DIAGNOSIS — E875 Hyperkalemia: Secondary | ICD-10-CM | POA: Diagnosis not present

## 2017-02-07 DIAGNOSIS — Z1159 Encounter for screening for other viral diseases: Secondary | ICD-10-CM | POA: Diagnosis not present

## 2017-02-07 DIAGNOSIS — N184 Chronic kidney disease, stage 4 (severe): Secondary | ICD-10-CM | POA: Diagnosis not present

## 2017-02-08 ENCOUNTER — Other Ambulatory Visit (HOSPITAL_COMMUNITY): Payer: Self-pay | Admitting: Nephrology

## 2017-02-08 DIAGNOSIS — N183 Chronic kidney disease, stage 3 unspecified: Secondary | ICD-10-CM

## 2017-02-11 DIAGNOSIS — E1122 Type 2 diabetes mellitus with diabetic chronic kidney disease: Secondary | ICD-10-CM | POA: Diagnosis not present

## 2017-02-11 DIAGNOSIS — I10 Essential (primary) hypertension: Secondary | ICD-10-CM | POA: Diagnosis not present

## 2017-02-11 DIAGNOSIS — E782 Mixed hyperlipidemia: Secondary | ICD-10-CM | POA: Diagnosis not present

## 2017-02-11 DIAGNOSIS — N184 Chronic kidney disease, stage 4 (severe): Secondary | ICD-10-CM | POA: Diagnosis not present

## 2017-02-15 ENCOUNTER — Encounter (INDEPENDENT_AMBULATORY_CARE_PROVIDER_SITE_OTHER): Payer: Medicare Other | Admitting: Ophthalmology

## 2017-02-18 ENCOUNTER — Encounter (INDEPENDENT_AMBULATORY_CARE_PROVIDER_SITE_OTHER): Payer: Medicare Other | Admitting: Ophthalmology

## 2017-02-18 DIAGNOSIS — E113393 Type 2 diabetes mellitus with moderate nonproliferative diabetic retinopathy without macular edema, bilateral: Secondary | ICD-10-CM | POA: Diagnosis not present

## 2017-02-18 DIAGNOSIS — H353112 Nonexudative age-related macular degeneration, right eye, intermediate dry stage: Secondary | ICD-10-CM

## 2017-02-18 DIAGNOSIS — H35033 Hypertensive retinopathy, bilateral: Secondary | ICD-10-CM | POA: Diagnosis not present

## 2017-02-18 DIAGNOSIS — H43813 Vitreous degeneration, bilateral: Secondary | ICD-10-CM

## 2017-02-18 DIAGNOSIS — H318 Other specified disorders of choroid: Secondary | ICD-10-CM | POA: Diagnosis not present

## 2017-02-18 DIAGNOSIS — E11319 Type 2 diabetes mellitus with unspecified diabetic retinopathy without macular edema: Secondary | ICD-10-CM | POA: Diagnosis not present

## 2017-02-18 DIAGNOSIS — I10 Essential (primary) hypertension: Secondary | ICD-10-CM

## 2017-02-25 DIAGNOSIS — I10 Essential (primary) hypertension: Secondary | ICD-10-CM | POA: Diagnosis not present

## 2017-02-27 ENCOUNTER — Ambulatory Visit (HOSPITAL_COMMUNITY)
Admission: RE | Admit: 2017-02-27 | Discharge: 2017-02-27 | Disposition: A | Discharge status: Home / Self Care | Payer: Medicare Other | Source: Ambulatory Visit | Attending: Nephrology | Admitting: Nephrology

## 2017-02-27 DIAGNOSIS — E559 Vitamin D deficiency, unspecified: Secondary | ICD-10-CM | POA: Diagnosis not present

## 2017-02-27 DIAGNOSIS — D509 Iron deficiency anemia, unspecified: Secondary | ICD-10-CM | POA: Diagnosis not present

## 2017-02-27 DIAGNOSIS — Z1159 Encounter for screening for other viral diseases: Secondary | ICD-10-CM | POA: Diagnosis not present

## 2017-02-27 DIAGNOSIS — N183 Chronic kidney disease, stage 3 unspecified: Secondary | ICD-10-CM

## 2017-02-27 DIAGNOSIS — Z79899 Other long term (current) drug therapy: Secondary | ICD-10-CM | POA: Diagnosis not present

## 2017-02-27 DIAGNOSIS — I10 Essential (primary) hypertension: Secondary | ICD-10-CM | POA: Diagnosis not present

## 2017-02-27 DIAGNOSIS — R809 Proteinuria, unspecified: Secondary | ICD-10-CM | POA: Diagnosis not present

## 2017-03-11 ENCOUNTER — Other Ambulatory Visit (HOSPITAL_COMMUNITY): Payer: Self-pay | Admitting: Nephrology

## 2017-03-11 DIAGNOSIS — R609 Edema, unspecified: Secondary | ICD-10-CM

## 2017-03-13 ENCOUNTER — Other Ambulatory Visit (HOSPITAL_COMMUNITY): Payer: Medicare Other

## 2017-03-18 ENCOUNTER — Encounter (INDEPENDENT_AMBULATORY_CARE_PROVIDER_SITE_OTHER): Payer: Medicare Other | Admitting: Ophthalmology

## 2017-03-18 DIAGNOSIS — E113393 Type 2 diabetes mellitus with moderate nonproliferative diabetic retinopathy without macular edema, bilateral: Secondary | ICD-10-CM | POA: Diagnosis not present

## 2017-03-18 DIAGNOSIS — H318 Other specified disorders of choroid: Secondary | ICD-10-CM

## 2017-03-18 DIAGNOSIS — I10 Essential (primary) hypertension: Secondary | ICD-10-CM

## 2017-03-18 DIAGNOSIS — H35033 Hypertensive retinopathy, bilateral: Secondary | ICD-10-CM | POA: Diagnosis not present

## 2017-03-18 DIAGNOSIS — E10311 Type 1 diabetes mellitus with unspecified diabetic retinopathy with macular edema: Secondary | ICD-10-CM

## 2017-03-18 DIAGNOSIS — H43813 Vitreous degeneration, bilateral: Secondary | ICD-10-CM | POA: Diagnosis not present

## 2017-03-19 ENCOUNTER — Ambulatory Visit (HOSPITAL_COMMUNITY)
Admission: RE | Admit: 2017-03-19 | Discharge: 2017-03-19 | Disposition: A | Discharge status: Home / Self Care | Payer: Medicare Other | Source: Ambulatory Visit | Attending: Nephrology | Admitting: Nephrology

## 2017-03-19 DIAGNOSIS — E119 Type 2 diabetes mellitus without complications: Secondary | ICD-10-CM | POA: Insufficient documentation

## 2017-03-19 DIAGNOSIS — R609 Edema, unspecified: Secondary | ICD-10-CM | POA: Insufficient documentation

## 2017-03-19 DIAGNOSIS — I10 Essential (primary) hypertension: Secondary | ICD-10-CM | POA: Insufficient documentation

## 2017-03-19 LAB — ECHOCARDIOGRAM COMPLETE
E decel time: 303 msec
E/e' ratio: 13.93
FS: 53 % — AB (ref 28–44)
IVS/LV PW RATIO, ED: 0.93
LA ID, A-P, ES: 34 mm
LA diam end sys: 34 mm
LA diam index: 1.5 cm/m2
LA vol A4C: 68.6 ml
LA vol index: 28.4 mL/m2
LA vol: 64.2 mL
LV E/e' medial: 13.93
LV E/e'average: 13.93
LV PW d: 9.81 mm — AB (ref 0.6–1.1)
LV dias vol index: 37 mL/m2
LV dias vol: 84 mL (ref 46–106)
LV e' LATERAL: 7.18 cm/s
LV sys vol index: 12 mL/m2
LV sys vol: 28 mL
LVOT SV: 87 mL
LVOT VTI: 34.2 cm
LVOT area: 2.54 cm2
LVOT diameter: 18 mm
LVOT peak grad rest: 6 mmHg
LVOT peak vel: 121 cm/s
MV Dec: 303
MV Peak grad: 4 mmHg
MV pk A vel: 115 m/s
MV pk E vel: 100 m/s
Simpson's disk: 67
Stroke v: 57 ml
TAPSE: 18.8 mm
TDI e' lateral: 7.18
TDI e' medial: 7.72

## 2017-03-19 NOTE — Progress Notes (Signed)
*  PRELIMINARY RESULTS* Echocardiogram 2D Echocardiogram has been performed.  Samuel Germany 03/19/2017, 11:09 AM

## 2017-03-20 DIAGNOSIS — R809 Proteinuria, unspecified: Secondary | ICD-10-CM | POA: Diagnosis not present

## 2017-03-20 DIAGNOSIS — E559 Vitamin D deficiency, unspecified: Secondary | ICD-10-CM | POA: Diagnosis not present

## 2017-03-20 DIAGNOSIS — E872 Acidosis: Secondary | ICD-10-CM | POA: Diagnosis not present

## 2017-03-20 DIAGNOSIS — N184 Chronic kidney disease, stage 4 (severe): Secondary | ICD-10-CM | POA: Diagnosis not present

## 2017-03-20 DIAGNOSIS — I509 Heart failure, unspecified: Secondary | ICD-10-CM | POA: Diagnosis not present

## 2017-03-20 DIAGNOSIS — N2581 Secondary hyperparathyroidism of renal origin: Secondary | ICD-10-CM | POA: Diagnosis not present

## 2017-03-20 DIAGNOSIS — E669 Obesity, unspecified: Secondary | ICD-10-CM | POA: Diagnosis not present

## 2017-04-25 ENCOUNTER — Encounter (INDEPENDENT_AMBULATORY_CARE_PROVIDER_SITE_OTHER): Payer: Medicare Other | Admitting: Ophthalmology

## 2017-04-25 DIAGNOSIS — H318 Other specified disorders of choroid: Secondary | ICD-10-CM | POA: Diagnosis not present

## 2017-04-25 DIAGNOSIS — H43813 Vitreous degeneration, bilateral: Secondary | ICD-10-CM

## 2017-04-25 DIAGNOSIS — E113393 Type 2 diabetes mellitus with moderate nonproliferative diabetic retinopathy without macular edema, bilateral: Secondary | ICD-10-CM | POA: Diagnosis not present

## 2017-04-25 DIAGNOSIS — H35033 Hypertensive retinopathy, bilateral: Secondary | ICD-10-CM

## 2017-04-25 DIAGNOSIS — E11319 Type 2 diabetes mellitus with unspecified diabetic retinopathy without macular edema: Secondary | ICD-10-CM | POA: Diagnosis not present

## 2017-04-25 DIAGNOSIS — I10 Essential (primary) hypertension: Secondary | ICD-10-CM

## 2017-04-25 DIAGNOSIS — H2513 Age-related nuclear cataract, bilateral: Secondary | ICD-10-CM

## 2017-05-23 DIAGNOSIS — E559 Vitamin D deficiency, unspecified: Secondary | ICD-10-CM | POA: Diagnosis not present

## 2017-05-23 DIAGNOSIS — D509 Iron deficiency anemia, unspecified: Secondary | ICD-10-CM | POA: Diagnosis not present

## 2017-05-23 DIAGNOSIS — R809 Proteinuria, unspecified: Secondary | ICD-10-CM | POA: Diagnosis not present

## 2017-05-23 DIAGNOSIS — Z79899 Other long term (current) drug therapy: Secondary | ICD-10-CM | POA: Diagnosis not present

## 2017-05-23 DIAGNOSIS — N183 Chronic kidney disease, stage 3 (moderate): Secondary | ICD-10-CM | POA: Diagnosis not present

## 2017-05-23 DIAGNOSIS — I10 Essential (primary) hypertension: Secondary | ICD-10-CM | POA: Diagnosis not present

## 2017-05-29 DIAGNOSIS — E559 Vitamin D deficiency, unspecified: Secondary | ICD-10-CM | POA: Diagnosis not present

## 2017-05-29 DIAGNOSIS — R809 Proteinuria, unspecified: Secondary | ICD-10-CM | POA: Diagnosis not present

## 2017-05-29 DIAGNOSIS — E875 Hyperkalemia: Secondary | ICD-10-CM | POA: Diagnosis not present

## 2017-05-29 DIAGNOSIS — N184 Chronic kidney disease, stage 4 (severe): Secondary | ICD-10-CM | POA: Diagnosis not present

## 2017-06-06 ENCOUNTER — Encounter (INDEPENDENT_AMBULATORY_CARE_PROVIDER_SITE_OTHER): Payer: Medicare Other | Admitting: Ophthalmology

## 2017-06-06 DIAGNOSIS — H43813 Vitreous degeneration, bilateral: Secondary | ICD-10-CM

## 2017-06-06 DIAGNOSIS — H35033 Hypertensive retinopathy, bilateral: Secondary | ICD-10-CM

## 2017-06-06 DIAGNOSIS — H34831 Tributary (branch) retinal vein occlusion, right eye, with macular edema: Secondary | ICD-10-CM | POA: Diagnosis not present

## 2017-06-06 DIAGNOSIS — I10 Essential (primary) hypertension: Secondary | ICD-10-CM | POA: Diagnosis not present

## 2017-06-07 DIAGNOSIS — E782 Mixed hyperlipidemia: Secondary | ICD-10-CM | POA: Diagnosis not present

## 2017-06-07 DIAGNOSIS — E1122 Type 2 diabetes mellitus with diabetic chronic kidney disease: Secondary | ICD-10-CM | POA: Diagnosis not present

## 2017-06-11 DIAGNOSIS — G4700 Insomnia, unspecified: Secondary | ICD-10-CM | POA: Diagnosis not present

## 2017-06-11 DIAGNOSIS — E1122 Type 2 diabetes mellitus with diabetic chronic kidney disease: Secondary | ICD-10-CM | POA: Diagnosis not present

## 2017-06-11 DIAGNOSIS — N184 Chronic kidney disease, stage 4 (severe): Secondary | ICD-10-CM | POA: Diagnosis not present

## 2017-06-11 DIAGNOSIS — I10 Essential (primary) hypertension: Secondary | ICD-10-CM | POA: Diagnosis not present

## 2017-06-11 DIAGNOSIS — M25561 Pain in right knee: Secondary | ICD-10-CM | POA: Diagnosis not present

## 2017-06-11 DIAGNOSIS — E782 Mixed hyperlipidemia: Secondary | ICD-10-CM | POA: Diagnosis not present

## 2017-07-18 ENCOUNTER — Encounter (INDEPENDENT_AMBULATORY_CARE_PROVIDER_SITE_OTHER): Payer: Medicare Other | Admitting: Ophthalmology

## 2017-07-18 DIAGNOSIS — E113313 Type 2 diabetes mellitus with moderate nonproliferative diabetic retinopathy with macular edema, bilateral: Secondary | ICD-10-CM

## 2017-07-18 DIAGNOSIS — H43813 Vitreous degeneration, bilateral: Secondary | ICD-10-CM | POA: Diagnosis not present

## 2017-07-18 DIAGNOSIS — E11311 Type 2 diabetes mellitus with unspecified diabetic retinopathy with macular edema: Secondary | ICD-10-CM | POA: Diagnosis not present

## 2017-07-18 DIAGNOSIS — H2513 Age-related nuclear cataract, bilateral: Secondary | ICD-10-CM | POA: Diagnosis not present

## 2017-07-18 DIAGNOSIS — H318 Other specified disorders of choroid: Secondary | ICD-10-CM | POA: Diagnosis not present

## 2017-07-29 DIAGNOSIS — N183 Chronic kidney disease, stage 3 (moderate): Secondary | ICD-10-CM | POA: Diagnosis not present

## 2017-07-29 DIAGNOSIS — R809 Proteinuria, unspecified: Secondary | ICD-10-CM | POA: Diagnosis not present

## 2017-07-29 DIAGNOSIS — E559 Vitamin D deficiency, unspecified: Secondary | ICD-10-CM | POA: Diagnosis not present

## 2017-07-29 DIAGNOSIS — D509 Iron deficiency anemia, unspecified: Secondary | ICD-10-CM | POA: Diagnosis not present

## 2017-07-29 DIAGNOSIS — I10 Essential (primary) hypertension: Secondary | ICD-10-CM | POA: Diagnosis not present

## 2017-07-29 DIAGNOSIS — Z79899 Other long term (current) drug therapy: Secondary | ICD-10-CM | POA: Diagnosis not present

## 2017-07-31 DIAGNOSIS — R809 Proteinuria, unspecified: Secondary | ICD-10-CM | POA: Diagnosis not present

## 2017-07-31 DIAGNOSIS — E875 Hyperkalemia: Secondary | ICD-10-CM | POA: Diagnosis not present

## 2017-07-31 DIAGNOSIS — N2581 Secondary hyperparathyroidism of renal origin: Secondary | ICD-10-CM | POA: Diagnosis not present

## 2017-07-31 DIAGNOSIS — N184 Chronic kidney disease, stage 4 (severe): Secondary | ICD-10-CM | POA: Diagnosis not present

## 2017-09-12 ENCOUNTER — Encounter (INDEPENDENT_AMBULATORY_CARE_PROVIDER_SITE_OTHER): Payer: Medicare Other | Admitting: Ophthalmology

## 2017-09-12 DIAGNOSIS — H318 Other specified disorders of choroid: Secondary | ICD-10-CM

## 2017-09-12 DIAGNOSIS — H2513 Age-related nuclear cataract, bilateral: Secondary | ICD-10-CM | POA: Diagnosis not present

## 2017-09-12 DIAGNOSIS — H43813 Vitreous degeneration, bilateral: Secondary | ICD-10-CM

## 2017-09-12 DIAGNOSIS — I10 Essential (primary) hypertension: Secondary | ICD-10-CM

## 2017-09-12 DIAGNOSIS — H35371 Puckering of macula, right eye: Secondary | ICD-10-CM | POA: Diagnosis not present

## 2017-09-12 DIAGNOSIS — E11319 Type 2 diabetes mellitus with unspecified diabetic retinopathy without macular edema: Secondary | ICD-10-CM

## 2017-09-12 DIAGNOSIS — H35033 Hypertensive retinopathy, bilateral: Secondary | ICD-10-CM

## 2017-09-12 DIAGNOSIS — E113393 Type 2 diabetes mellitus with moderate nonproliferative diabetic retinopathy without macular edema, bilateral: Secondary | ICD-10-CM | POA: Diagnosis not present

## 2017-10-09 DIAGNOSIS — E782 Mixed hyperlipidemia: Secondary | ICD-10-CM | POA: Diagnosis not present

## 2017-10-09 DIAGNOSIS — I10 Essential (primary) hypertension: Secondary | ICD-10-CM | POA: Diagnosis not present

## 2017-10-09 DIAGNOSIS — E1122 Type 2 diabetes mellitus with diabetic chronic kidney disease: Secondary | ICD-10-CM | POA: Diagnosis not present

## 2017-10-11 DIAGNOSIS — I10 Essential (primary) hypertension: Secondary | ICD-10-CM | POA: Diagnosis not present

## 2017-10-11 DIAGNOSIS — N184 Chronic kidney disease, stage 4 (severe): Secondary | ICD-10-CM | POA: Diagnosis not present

## 2017-10-11 DIAGNOSIS — Z0000 Encounter for general adult medical examination without abnormal findings: Secondary | ICD-10-CM | POA: Diagnosis not present

## 2017-10-11 DIAGNOSIS — E782 Mixed hyperlipidemia: Secondary | ICD-10-CM | POA: Diagnosis not present

## 2017-10-11 DIAGNOSIS — F5101 Primary insomnia: Secondary | ICD-10-CM | POA: Diagnosis not present

## 2017-10-11 DIAGNOSIS — E1122 Type 2 diabetes mellitus with diabetic chronic kidney disease: Secondary | ICD-10-CM | POA: Diagnosis not present

## 2017-10-30 DIAGNOSIS — N184 Chronic kidney disease, stage 4 (severe): Secondary | ICD-10-CM | POA: Diagnosis not present

## 2017-10-30 DIAGNOSIS — R809 Proteinuria, unspecified: Secondary | ICD-10-CM | POA: Diagnosis not present

## 2017-10-30 DIAGNOSIS — E875 Hyperkalemia: Secondary | ICD-10-CM | POA: Diagnosis not present

## 2017-10-30 DIAGNOSIS — E669 Obesity, unspecified: Secondary | ICD-10-CM | POA: Diagnosis not present

## 2017-11-07 ENCOUNTER — Encounter (INDEPENDENT_AMBULATORY_CARE_PROVIDER_SITE_OTHER): Payer: Medicare Other | Admitting: Ophthalmology

## 2017-11-07 DIAGNOSIS — H35033 Hypertensive retinopathy, bilateral: Secondary | ICD-10-CM

## 2017-11-07 DIAGNOSIS — E11311 Type 2 diabetes mellitus with unspecified diabetic retinopathy with macular edema: Secondary | ICD-10-CM

## 2017-11-07 DIAGNOSIS — H318 Other specified disorders of choroid: Secondary | ICD-10-CM

## 2017-11-07 DIAGNOSIS — E113391 Type 2 diabetes mellitus with moderate nonproliferative diabetic retinopathy without macular edema, right eye: Secondary | ICD-10-CM

## 2017-11-07 DIAGNOSIS — H43813 Vitreous degeneration, bilateral: Secondary | ICD-10-CM

## 2017-11-07 DIAGNOSIS — E113312 Type 2 diabetes mellitus with moderate nonproliferative diabetic retinopathy with macular edema, left eye: Secondary | ICD-10-CM

## 2017-11-07 DIAGNOSIS — I10 Essential (primary) hypertension: Secondary | ICD-10-CM | POA: Diagnosis not present

## 2017-11-13 DIAGNOSIS — I10 Essential (primary) hypertension: Secondary | ICD-10-CM | POA: Diagnosis not present

## 2017-11-13 DIAGNOSIS — Z79899 Other long term (current) drug therapy: Secondary | ICD-10-CM | POA: Diagnosis not present

## 2017-11-13 DIAGNOSIS — N183 Chronic kidney disease, stage 3 (moderate): Secondary | ICD-10-CM | POA: Diagnosis not present

## 2017-11-19 DIAGNOSIS — E1122 Type 2 diabetes mellitus with diabetic chronic kidney disease: Secondary | ICD-10-CM | POA: Diagnosis not present

## 2017-11-19 DIAGNOSIS — E782 Mixed hyperlipidemia: Secondary | ICD-10-CM | POA: Diagnosis not present

## 2017-11-19 DIAGNOSIS — N184 Chronic kidney disease, stage 4 (severe): Secondary | ICD-10-CM | POA: Diagnosis not present

## 2017-11-19 DIAGNOSIS — G4700 Insomnia, unspecified: Secondary | ICD-10-CM | POA: Diagnosis not present

## 2017-11-19 DIAGNOSIS — E11628 Type 2 diabetes mellitus with other skin complications: Secondary | ICD-10-CM | POA: Diagnosis not present

## 2017-11-19 DIAGNOSIS — F5101 Primary insomnia: Secondary | ICD-10-CM | POA: Diagnosis not present

## 2017-11-19 DIAGNOSIS — M25561 Pain in right knee: Secondary | ICD-10-CM | POA: Diagnosis not present

## 2017-11-19 DIAGNOSIS — Z0000 Encounter for general adult medical examination without abnormal findings: Secondary | ICD-10-CM | POA: Diagnosis not present

## 2017-11-19 DIAGNOSIS — I10 Essential (primary) hypertension: Secondary | ICD-10-CM | POA: Diagnosis not present

## 2017-11-28 DIAGNOSIS — E11628 Type 2 diabetes mellitus with other skin complications: Secondary | ICD-10-CM | POA: Diagnosis not present

## 2017-12-21 ENCOUNTER — Other Ambulatory Visit: Payer: Self-pay

## 2017-12-21 ENCOUNTER — Inpatient Hospital Stay (HOSPITAL_COMMUNITY)
Admission: EM | Admit: 2017-12-21 | Discharge: 2017-12-25 | DRG: 638 | Disposition: A | Discharge status: Home Health | Payer: Medicare Other | Attending: Internal Medicine | Admitting: Internal Medicine

## 2017-12-21 ENCOUNTER — Emergency Department (HOSPITAL_COMMUNITY): Payer: Medicare Other

## 2017-12-21 ENCOUNTER — Encounter (HOSPITAL_COMMUNITY): Payer: Self-pay | Admitting: Emergency Medicine

## 2017-12-21 DIAGNOSIS — R739 Hyperglycemia, unspecified: Secondary | ICD-10-CM | POA: Diagnosis not present

## 2017-12-21 DIAGNOSIS — E119 Type 2 diabetes mellitus without complications: Secondary | ICD-10-CM

## 2017-12-21 DIAGNOSIS — L97519 Non-pressure chronic ulcer of other part of right foot with unspecified severity: Secondary | ICD-10-CM | POA: Diagnosis present

## 2017-12-21 DIAGNOSIS — B9562 Methicillin resistant Staphylococcus aureus infection as the cause of diseases classified elsewhere: Secondary | ICD-10-CM | POA: Diagnosis not present

## 2017-12-21 DIAGNOSIS — L03115 Cellulitis of right lower limb: Secondary | ICD-10-CM

## 2017-12-21 DIAGNOSIS — E875 Hyperkalemia: Secondary | ICD-10-CM | POA: Diagnosis not present

## 2017-12-21 DIAGNOSIS — M542 Cervicalgia: Secondary | ICD-10-CM | POA: Diagnosis not present

## 2017-12-21 DIAGNOSIS — L97509 Non-pressure chronic ulcer of other part of unspecified foot with unspecified severity: Secondary | ICD-10-CM

## 2017-12-21 DIAGNOSIS — R809 Proteinuria, unspecified: Secondary | ICD-10-CM | POA: Diagnosis not present

## 2017-12-21 DIAGNOSIS — Z7982 Long term (current) use of aspirin: Secondary | ICD-10-CM

## 2017-12-21 DIAGNOSIS — L97419 Non-pressure chronic ulcer of right heel and midfoot with unspecified severity: Secondary | ICD-10-CM | POA: Diagnosis present

## 2017-12-21 DIAGNOSIS — Z1623 Resistance to quinolones and fluoroquinolones: Secondary | ICD-10-CM | POA: Diagnosis present

## 2017-12-21 DIAGNOSIS — Z794 Long term (current) use of insulin: Secondary | ICD-10-CM

## 2017-12-21 DIAGNOSIS — E1129 Type 2 diabetes mellitus with other diabetic kidney complication: Secondary | ICD-10-CM | POA: Diagnosis not present

## 2017-12-21 DIAGNOSIS — E1121 Type 2 diabetes mellitus with diabetic nephropathy: Secondary | ICD-10-CM | POA: Diagnosis present

## 2017-12-21 DIAGNOSIS — E871 Hypo-osmolality and hyponatremia: Secondary | ICD-10-CM | POA: Diagnosis not present

## 2017-12-21 DIAGNOSIS — N200 Calculus of kidney: Secondary | ICD-10-CM | POA: Diagnosis not present

## 2017-12-21 DIAGNOSIS — I129 Hypertensive chronic kidney disease with stage 1 through stage 4 chronic kidney disease, or unspecified chronic kidney disease: Secondary | ICD-10-CM | POA: Diagnosis present

## 2017-12-21 DIAGNOSIS — M549 Dorsalgia, unspecified: Secondary | ICD-10-CM | POA: Diagnosis not present

## 2017-12-21 DIAGNOSIS — Z6841 Body Mass Index (BMI) 40.0 and over, adult: Secondary | ICD-10-CM | POA: Diagnosis not present

## 2017-12-21 DIAGNOSIS — R700 Elevated erythrocyte sedimentation rate: Secondary | ICD-10-CM | POA: Diagnosis present

## 2017-12-21 DIAGNOSIS — I10 Essential (primary) hypertension: Secondary | ICD-10-CM | POA: Diagnosis present

## 2017-12-21 DIAGNOSIS — N170 Acute kidney failure with tubular necrosis: Secondary | ICD-10-CM | POA: Diagnosis not present

## 2017-12-21 DIAGNOSIS — N184 Chronic kidney disease, stage 4 (severe): Secondary | ICD-10-CM | POA: Diagnosis present

## 2017-12-21 DIAGNOSIS — D631 Anemia in chronic kidney disease: Secondary | ICD-10-CM | POA: Diagnosis present

## 2017-12-21 DIAGNOSIS — E1122 Type 2 diabetes mellitus with diabetic chronic kidney disease: Secondary | ICD-10-CM | POA: Diagnosis present

## 2017-12-21 DIAGNOSIS — R7989 Other specified abnormal findings of blood chemistry: Secondary | ICD-10-CM | POA: Diagnosis present

## 2017-12-21 DIAGNOSIS — L97418 Non-pressure chronic ulcer of right heel and midfoot with other specified severity: Secondary | ICD-10-CM | POA: Diagnosis not present

## 2017-12-21 DIAGNOSIS — N183 Chronic kidney disease, stage 3 unspecified: Secondary | ICD-10-CM | POA: Diagnosis present

## 2017-12-21 DIAGNOSIS — E1151 Type 2 diabetes mellitus with diabetic peripheral angiopathy without gangrene: Secondary | ICD-10-CM | POA: Diagnosis present

## 2017-12-21 DIAGNOSIS — E11621 Type 2 diabetes mellitus with foot ulcer: Secondary | ICD-10-CM | POA: Diagnosis not present

## 2017-12-21 DIAGNOSIS — E872 Acidosis: Secondary | ICD-10-CM | POA: Diagnosis not present

## 2017-12-21 DIAGNOSIS — M7989 Other specified soft tissue disorders: Secondary | ICD-10-CM | POA: Diagnosis not present

## 2017-12-21 DIAGNOSIS — E1165 Type 2 diabetes mellitus with hyperglycemia: Secondary | ICD-10-CM | POA: Diagnosis present

## 2017-12-21 DIAGNOSIS — N289 Disorder of kidney and ureter, unspecified: Secondary | ICD-10-CM

## 2017-12-21 DIAGNOSIS — E785 Hyperlipidemia, unspecified: Secondary | ICD-10-CM | POA: Diagnosis present

## 2017-12-21 DIAGNOSIS — E08621 Diabetes mellitus due to underlying condition with foot ulcer: Secondary | ICD-10-CM | POA: Diagnosis not present

## 2017-12-21 DIAGNOSIS — L97402 Non-pressure chronic ulcer of unspecified heel and midfoot with fat layer exposed: Secondary | ICD-10-CM | POA: Diagnosis not present

## 2017-12-21 DIAGNOSIS — Z79899 Other long term (current) drug therapy: Secondary | ICD-10-CM

## 2017-12-21 DIAGNOSIS — M609 Myositis, unspecified: Secondary | ICD-10-CM | POA: Diagnosis present

## 2017-12-21 DIAGNOSIS — Z89431 Acquired absence of right foot: Secondary | ICD-10-CM

## 2017-12-21 DIAGNOSIS — N179 Acute kidney failure, unspecified: Secondary | ICD-10-CM | POA: Diagnosis present

## 2017-12-21 DIAGNOSIS — L02611 Cutaneous abscess of right foot: Secondary | ICD-10-CM | POA: Diagnosis present

## 2017-12-21 DIAGNOSIS — L0390 Cellulitis, unspecified: Secondary | ICD-10-CM | POA: Diagnosis present

## 2017-12-21 DIAGNOSIS — M79671 Pain in right foot: Secondary | ICD-10-CM | POA: Diagnosis not present

## 2017-12-21 LAB — GLUCOSE, CAPILLARY
GLUCOSE-CAPILLARY: 152 mg/dL — AB (ref 65–99)
Glucose-Capillary: 112 mg/dL — ABNORMAL HIGH (ref 65–99)

## 2017-12-21 LAB — CBC WITH DIFFERENTIAL/PLATELET
BASOS PCT: 0 %
Basophils Absolute: 0 10*3/uL (ref 0.0–0.1)
Eosinophils Absolute: 0 10*3/uL (ref 0.0–0.7)
Eosinophils Relative: 0 %
HCT: 32.8 % — ABNORMAL LOW (ref 36.0–46.0)
Hemoglobin: 10.6 g/dL — ABNORMAL LOW (ref 12.0–15.0)
Lymphocytes Relative: 9 %
Lymphs Abs: 1.6 10*3/uL (ref 0.7–4.0)
MCH: 30 pg (ref 26.0–34.0)
MCHC: 32.3 g/dL (ref 30.0–36.0)
MCV: 92.9 fL (ref 78.0–100.0)
MONOS PCT: 12 %
Monocytes Absolute: 2.2 10*3/uL — ABNORMAL HIGH (ref 0.1–1.0)
NEUTROS ABS: 14.9 10*3/uL — AB (ref 1.7–7.7)
NEUTROS PCT: 79 %
Platelets: 248 10*3/uL (ref 150–400)
RBC: 3.53 MIL/uL — AB (ref 3.87–5.11)
RDW: 13.2 % (ref 11.5–15.5)
WBC: 18.7 10*3/uL — AB (ref 4.0–10.5)

## 2017-12-21 LAB — BASIC METABOLIC PANEL
ANION GAP: 11 (ref 5–15)
BUN: 54 mg/dL — AB (ref 6–20)
CALCIUM: 8.9 mg/dL (ref 8.9–10.3)
CO2: 22 mmol/L (ref 22–32)
CREATININE: 2.8 mg/dL — AB (ref 0.44–1.00)
Chloride: 100 mmol/L — ABNORMAL LOW (ref 101–111)
GFR calc Af Amer: 19 mL/min — ABNORMAL LOW (ref >60)
GFR, EST NON AFRICAN AMERICAN: 16 mL/min — AB (ref >60)
GLUCOSE: 151 mg/dL — AB (ref 65–99)
Potassium: 4.7 mmol/L (ref 3.5–5.1)
Sodium: 133 mmol/L — ABNORMAL LOW (ref 135–145)

## 2017-12-21 LAB — SEDIMENTATION RATE: SED RATE: 95 mm/h — AB (ref 0–22)

## 2017-12-21 MED ORDER — ACETAMINOPHEN 325 MG PO TABS
650.0000 mg | ORAL_TABLET | Freq: Four times a day (QID) | ORAL | Status: DC | PRN
  Administered 2017-12-21 – 2017-12-24 (×5): 650 mg via ORAL
  Filled 2017-12-21 (×5): qty 2

## 2017-12-21 MED ORDER — ACETAMINOPHEN 650 MG RE SUPP: 650.0000 mg | Freq: Four times a day (QID) | RECTAL | Status: DC | PRN

## 2017-12-21 MED ORDER — VANCOMYCIN HCL IN DEXTROSE 1-5 GM/200ML-% IV SOLN
1000.0000 mg | Freq: Once | INTRAVENOUS | Status: AC
  Administered 2017-12-21: 1000 mg via INTRAVENOUS
  Filled 2017-12-21: qty 200

## 2017-12-21 MED ORDER — SODIUM CHLORIDE 0.9 % IV BOLUS
1000.0000 mL | Freq: Once | INTRAVENOUS | Status: AC
  Administered 2017-12-21: 1000 mL via INTRAVENOUS

## 2017-12-21 MED ORDER — ENOXAPARIN SODIUM 30 MG/0.3ML ~~LOC~~ SOLN
30.0000 mg | SUBCUTANEOUS | Status: DC
  Administered 2017-12-21 – 2017-12-24 (×4): 30 mg via SUBCUTANEOUS
  Filled 2017-12-21 (×4): qty 0.3

## 2017-12-21 MED ORDER — MORPHINE SULFATE (PF) 4 MG/ML IV SOLN
4.0000 mg | Freq: Once | INTRAVENOUS | Status: AC
  Administered 2017-12-21: 4 mg via INTRAVENOUS
  Filled 2017-12-21: qty 1

## 2017-12-21 MED ORDER — INSULIN GLARGINE 100 UNIT/ML ~~LOC~~ SOLN
26.0000 [IU] | Freq: Every day | SUBCUTANEOUS | Status: DC
  Administered 2017-12-21 – 2017-12-24 (×4): 26 [IU] via SUBCUTANEOUS
  Filled 2017-12-21 (×5): qty 0.26

## 2017-12-21 MED ORDER — ONDANSETRON HCL 4 MG PO TABS: 4.0000 mg | ORAL_TABLET | Freq: Four times a day (QID) | ORAL | Status: DC | PRN

## 2017-12-21 MED ORDER — ONDANSETRON HCL 4 MG/2ML IJ SOLN
4.0000 mg | Freq: Once | INTRAMUSCULAR | Status: AC
  Administered 2017-12-21: 4 mg via INTRAVENOUS
  Filled 2017-12-21: qty 2

## 2017-12-21 MED ORDER — PRAVASTATIN SODIUM 40 MG PO TABS
40.0000 mg | ORAL_TABLET | Freq: Every day | ORAL | Status: DC
  Administered 2017-12-21 – 2017-12-24 (×4): 40 mg via ORAL
  Filled 2017-12-21 (×4): qty 1

## 2017-12-21 MED ORDER — ASPIRIN EC 81 MG PO TBEC
81.0000 mg | DELAYED_RELEASE_TABLET | Freq: Every day | ORAL | Status: DC
  Administered 2017-12-21 – 2017-12-25 (×5): 81 mg via ORAL
  Filled 2017-12-21 (×5): qty 1

## 2017-12-21 MED ORDER — SODIUM CHLORIDE 0.9 % IV SOLN
1.0000 g | Freq: Three times a day (TID) | INTRAVENOUS | Status: DC
  Administered 2017-12-21 – 2017-12-22 (×3): 1 g via INTRAVENOUS
  Filled 2017-12-21 (×8): qty 1

## 2017-12-21 MED ORDER — HYDROCODONE-ACETAMINOPHEN 5-325 MG PO TABS
1.0000 | ORAL_TABLET | ORAL | Status: DC | PRN
Start: 2017-12-21 — End: 2017-12-25
  Administered 2017-12-23 – 2017-12-24 (×3): 1 via ORAL
  Filled 2017-12-21 (×3): qty 1

## 2017-12-21 MED ORDER — INSULIN ASPART 100 UNIT/ML ~~LOC~~ SOLN: 0.0000 [IU] | Freq: Every day | SUBCUTANEOUS | Status: DC

## 2017-12-21 MED ORDER — ONDANSETRON HCL 4 MG/2ML IJ SOLN
4.0000 mg | Freq: Four times a day (QID) | INTRAMUSCULAR | Status: DC | PRN
  Administered 2017-12-23: 4 mg via INTRAVENOUS
  Filled 2017-12-21: qty 2

## 2017-12-21 MED ORDER — CLINDAMYCIN PHOSPHATE 900 MG/50ML IV SOLN
900.0000 mg | Freq: Three times a day (TID) | INTRAVENOUS | Status: DC
  Administered 2017-12-21 – 2017-12-25 (×12): 900 mg via INTRAVENOUS
  Filled 2017-12-21 (×11): qty 50

## 2017-12-21 MED ORDER — INSULIN ASPART 100 UNIT/ML ~~LOC~~ SOLN
0.0000 [IU] | Freq: Three times a day (TID) | SUBCUTANEOUS | Status: DC
  Administered 2017-12-22 – 2017-12-25 (×3): 2 [IU] via SUBCUTANEOUS

## 2017-12-21 MED ORDER — SODIUM CHLORIDE 0.9 % IV SOLN
INTRAVENOUS | Status: DC
  Administered 2017-12-21 – 2017-12-25 (×9): via INTRAVENOUS

## 2017-12-21 NOTE — H&P (Signed)
History and Physical    Courtney Schneider [id number] DOB: 1945-09-18 DOA: 12/21/2017  PCP: Celene Squibb, MD  Patient coming from: Home  I have personally briefly reviewed patient's old medical records in [place]  Chief Complaint: Foot wound  HPI: Courtney Schneider is a 72 y.o. female with medical history significant of diabetes who has a right foot wound on the plantar aspect of her foot which is been present for several weeks now.  She is missing her primary care physician about this and undergoing dressing change.  It was also briefly debrided in the office.  Over the past several days, she has noticed some foul smelling drainage coming from wound and worsening erythema/swelling of her foot.  She has had a prior transmetatarsal amputation of this foot in 2010.  She has felt feverish and some chills.  Denies any vomiting, diarrhea, shortness of breath, chest pain  ED Course: She was noted to have an elevated WBC count.  She was afebrile in the emergency room, and hemodynamics were noted to be stable.  She is noted to have an elevated creatinine at 2.8 and a WBC count of 18.7.  Plain films of right foot did not indicate any underlying osteomyelitis, but does show some gas bubbles.  She is being admitted for further evaluation.   Review of Systems: As per HPI otherwise 10 point review of systems negative.    Past Medical History:  Diagnosis Date  . Diabetes mellitus without complication ([place])   . Hypertension     Past Surgical History:  Procedure Laterality Date  . AMPUTATION TOE    . CHOLECYSTECTOMY       reports that she has never smoked. She has never used smokeless tobacco. She reports that she does not drink alcohol or use drugs.  No Known Allergies  Family history: Family history reviewed and not pertinent  Prior to Admission medications   Medication Sig Start Date End Date Taking? Authorizing Provider  amLODipine-valsartan (EXFORGE) 10-160 MG tablet Take 1  tablet by mouth daily.   Yes [provider]  aspirin EC 81 MG tablet Take 81 mg by mouth daily.   Yes [provider]  furosemide (LASIX) 40 MG tablet Take 40 mg by mouth.   Yes [provider]  insulin glargine (LANTUS) 100 UNIT/ML injection Inject 26 Units into the skin at bedtime.    Yes [provider]  metoprolol (LOPRESSOR) 50 MG tablet Take 50 mg by mouth daily.   Yes [provider]  Multiple Vitamins-Minerals (PRESERVISION AREDS PO) Take 1 tablet by mouth daily.   Yes [provider]  naproxen sodium (ALEVE) 220 MG tablet Take 440 mg by mouth 2 (two) times daily as needed (Pain).   Yes [provider]  pioglitazone (ACTOS) 15 MG tablet Take 15 mg by mouth daily.   Yes [provider]  pravastatin (PRAVACHOL) 40 MG tablet Take 40 mg by mouth daily.   Yes [provider]  HYDROcodone-acetaminophen (NORCO/VICODIN) 5-325 MG tablet Take 1 tablet by mouth every 4 (four) hours as needed. Patient not taking: Reported on 12/21/2017 09/10/15   Dorie Rank, MD    Physical Exam: Vitals:   12/21/17 1230 12/21/17 1332 12/21/17 1500 12/21/17 1530  BP: (!) 106/39 (!) 119/44 (!) 127/45 (!) 110/43  Pulse: 79 78    Resp:  20    Temp:      TempSrc:      SpO2: 98% 97%    Weight:  Height:        Constitutional: NAD, calm, comfortable Vitals:   12/21/17 1230 12/21/17 1332 12/21/17 1500 12/21/17 1530  BP: (!) 106/39 (!) 119/44 (!) 127/45 (!) 110/43  Pulse: 79 78    Resp:  20    Temp:      TempSrc:      SpO2: 98% 97%    Weight:      Height:       Eyes: PERRL, lids and conjunctivae normal ENMT: Mucous membranes are moist. Posterior pharynx clear of any exudate or lesions.Normal dentition.  Neck: normal, supple, no masses, no thyromegaly Respiratory: clear to auscultation bilaterally, no wheezing, no crackles. Normal respiratory effort. No accessory muscle use.  Cardiovascular: Regular rate and rhythm, no  murmurs / rubs / gallops. No extremity edema. 2+ pedal pulses. No carotid bruits.  Abdomen: no tenderness, no masses palpated. No hepatosplenomegaly. Bowel sounds positive.  Musculoskeletal: no clubbing / cyanosis. No joint deformity upper and lower extremities. Good ROM, no contractures. Normal muscle tone.  Skin: Right foot wound is as pictured below Neurologic: CN 2-12 grossly intact. Sensation intact, DTR normal. Strength 5/5 in all 4.  Psychiatric: Normal judgment and insight. Alert and oriented x 3. Normal mood.        Labs on Admission: I have personally reviewed following labs and imaging studies  CBC: Recent Labs  Lab 12/21/17 1159  WBC 18.7*  NEUTROABS 14.9*  HGB 10.6*  HCT 32.8*  MCV 92.9  PLT 517   Basic Metabolic Panel: Recent Labs  Lab 12/21/17 1159  NA 133*  K 4.7  CL 100*  CO2 22  GLUCOSE 151*  BUN 54*  CREATININE 2.80*  CALCIUM 8.9   GFR: Estimated Creatinine Clearance: 22.3 mL/min (A) (by C-G formula based on SCr of 2.8 mg/dL (H)). Liver Function Tests: No results for input(s): AST, ALT, ALKPHOS, BILITOT, PROT, ALBUMIN in the last 168 hours. No results for input(s): LIPASE, AMYLASE in the last 168 hours. No results for input(s): AMMONIA in the last 168 hours. Coagulation Profile: No results for input(s): INR, PROTIME in the last 168 hours. Cardiac Enzymes: No results for input(s): CKTOTAL, CKMB, CKMBINDEX, TROPONINI in the last 168 hours. BNP (last 3 results) No results for input(s): PROBNP in the last 8760 hours. HbA1C: No results for input(s): HGBA1C in the last 72 hours. CBG: No results for input(s): GLUCAP in the last 168 hours. Lipid Profile: No results for input(s): CHOL, HDL, LDLCALC, TRIG, CHOLHDL, LDLDIRECT in the last 72 hours. Thyroid Function Tests: No results for input(s): TSH, T4TOTAL, FREET4, T3FREE, THYROIDAB in the last 72 hours. Anemia Panel: No results for input(s): VITAMINB12, FOLATE, FERRITIN, TIBC, IRON, RETICCTPCT in  the last 72 hours. Urine analysis: No results found for: COLORURINE, APPEARANCEUR, LABSPEC, PHURINE, GLUCOSEU, HGBUR, BILIRUBINUR, KETONESUR, PROTEINUR, UROBILINOGEN, NITRITE, LEUKOCYTESUR  Radiological Exams on Admission: Dg Foot Complete Right  Result Date: 12/21/2017 CLINICAL DATA:  Right foot pain and redness of the amputation site. EXAM: RIGHT FOOT COMPLETE - 3+ VIEW COMPARISON:  04/27/2009 FINDINGS: There has been interval amputation of the right foot at the level of the tarsometatarsal joints. There is soft tissue thickening overlying the amputation site. Deep ulcer is seen within the soft tissues in the medial aspect of the amputation site. With small amount of soft tissue emphysema. No osteolytic changes of the underlying osseous structures. IMPRESSION: Status post amputation at the level of the tarsometatarsal joints. Significant soft tissue swelling at the amputation site with a deep ulcer medially, and  small amount of soft tissue emphysema. Findings are suspicious for cellulitis/fasciitis, possibly necrotizing. No radiographic evidence of osteomyelitis. Electronically Signed   By: Fidela Salisbury M.D.   On: 12/21/2017 13:06      Assessment/Plan Active Problems:   Cellulitis   AKI (acute kidney injury) ([place])   HTN (hypertension)   HLD (hyperlipidemia)   Insulin dependent type 2 diabetes mellitus ([place])     1. Cellulitis of right foot.  She has a draining wound on the dorsal aspect of her right foot.  X-ray does not indicate underlying osteomyelitis, but due to gas was cannot rule out necrotizing fasciitis.  Clinically does not appear to be necrotizing infection.  General surgery has been consulted.  We will continue on clindamycin as well as Azactam for now.  Wound culture.  ABIs have also been ordered. 2. Acute kidney injury.  Suspect is related to her infection.  Baseline creatinine is not clear.  Start IV fluids and recheck in a.m. 3. Hypertension.  Blood pressures are  running soft.  Will hold antihypertensives for now.   4. Hyperlipidemia.  Continue statin 5. Diabetes.  Continue on Lantus and supplement with sliding scale insulin.  DVT prophylaxis: Lovenox Code Status: Full code Family Communication: Discussed with daughter at the bedside Disposition Plan: Discharge home once improved Consults called: General surgery Admission status: Inpatient, telemetry  Kathie Dike MD [facility] Pager [phone]  If 7PM-7AM, please contact night-coverage [email or web address] Password [facility]  12/21/2017, 3:37 PM

## 2017-12-21 NOTE — Progress Notes (Signed)
Pharmacy Antibiotic Note  Courtney Schneider is a 72 y.o. female admitted on 12/21/2017 with Wound Infection.  Pharmacy has been consulted for Aztreonam dosing.  Plan: Aztreonam 1gm IV every 8 hours. Monitor labs, micro and vitals.   Height: 5\' 4"  (162.6 cm) Weight: 230 lb (104.3 kg) IBW/kg (Calculated) : 54.7  Temp (24hrs), Avg:99 F (37.2 C), Min:99 F (37.2 C), Max:99 F (37.2 C)  Recent Labs  Lab 12/21/17 1159  WBC 18.7*  CREATININE 2.80*    Estimated Creatinine Clearance: 22.3 mL/min (A) (by C-G formula based on SCr of 2.8 mg/dL (H)).    No Known Allergies  Antimicrobials this admission: Aztreonam 5/25 >>    >>   Dose adjustments this admission: n/a   Microbiology results:  BCx:   UCx:    Sputum:    MRSA PCR:   Thank you for allowing pharmacy to be a part of this patient's care.  Pricilla Larsson 12/21/2017 4:01 PM

## 2017-12-21 NOTE — ED Provider Notes (Signed)
[facility] EMERGENCY DEPARTMENT Provider Note   CSN: [id number] Arrival date & time: 12/21/17  4098     History   Chief Complaint Chief Complaint  Patient presents with  . Foot Problem    HPI Courtney Schneider is a 72 y.o. female with a past medical history significant for diabetes and amputation of the toes on her right foot in 2010 secondary to an infection presenting with a worsening wound to her plantar distal right foot which started 3 weeks ago.  She denies specific injury and has been seen by her pcp for wound care including once visit for wound debridement during which time a small piece of metal was removed from the wound.  She reports increased pain, redness, swelling and drainage from the wound site over the past 3 days in association with chills, myalgia but no documented fevers with nausea since arrival this am.  She has not checked her cbg in the past several days, but endorses increased thirst. She has had no medications for treatment of this infection..  The history is provided by the patient.    Past Medical History:  Diagnosis Date  . Diabetes mellitus without complication ([place])   . Hypertension     There are no active problems to display for this patient.   Past Surgical History:  Procedure Laterality Date  . AMPUTATION TOE    . CHOLECYSTECTOMY       OB History   None      Home Medications    Prior to Admission medications   Medication Sig Start Date End Date Taking? Authorizing Provider  amLODipine-valsartan (EXFORGE) 10-160 MG tablet Take 1 tablet by mouth daily.   Yes [provider]  aspirin EC 81 MG tablet Take 81 mg by mouth daily.   Yes [provider]  furosemide (LASIX) 40 MG tablet Take 40 mg by mouth.   Yes [provider]  insulin glargine (LANTUS) 100 UNIT/ML injection Inject 26 Units into the skin at bedtime.    Yes [provider]  metoprolol (LOPRESSOR) 50 MG tablet Take 50 mg by mouth daily.    Yes [provider]  Multiple Vitamins-Minerals (PRESERVISION AREDS PO) Take 1 tablet by mouth daily.   Yes [provider]  naproxen sodium (ALEVE) 220 MG tablet Take 440 mg by mouth 2 (two) times daily as needed (Pain).   Yes [provider]  pioglitazone (ACTOS) 15 MG tablet Take 15 mg by mouth daily.   Yes [provider]  pravastatin (PRAVACHOL) 40 MG tablet Take 40 mg by mouth daily.   Yes [provider]  HYDROcodone-acetaminophen (NORCO/VICODIN) 5-325 MG tablet Take 1 tablet by mouth every 4 (four) hours as needed. Patient not taking: Reported on 12/21/2017 09/10/15   Dorie Rank, MD    Family History No family history on file.  Social History Social History   Tobacco Use  . Smoking status: Never Smoker  . Smokeless tobacco: Never Used  Substance Use Topics  . Alcohol use: No  . Drug use: No     Allergies   Patient has no known allergies.   Review of Systems Review of Systems  Constitutional: Negative for fever.  Musculoskeletal: Positive for arthralgias, joint swelling and myalgias.  Skin: Positive for color change and wound.  Neurological: Negative for weakness and numbness.     Physical Exam Updated Vital Signs BP (!) 137/42   Pulse 85   Temp 99 F (37.2 C) (Oral)   Resp 16  Ht 5\' 4"  (1.626 m)   Wt 104.3 kg (230 lb)   SpO2 94%   BMI 39.48 kg/m   Physical Exam  Constitutional: She appears well-developed and well-nourished.  HENT:  Head: Atraumatic.  Neck: Normal range of motion.  Cardiovascular:  Pulses equal bilaterally  Musculoskeletal: She exhibits edema and tenderness.  Amputation with well healed surgical incision right foot, all toes.  Dime sized ulceration distal plantar foot with surrounding erythema of foot ending at the ankle.  No red streaking. Increased warmth.  No active drainage from the wound.    Neurological: She is alert. She has normal strength. She displays normal reflexes. No sensory  deficit.  Skin: Skin is warm and dry.  Psychiatric: She has a normal mood and affect.     ED Treatments / Results  Labs (all labs ordered are listed, but only abnormal results are displayed) Labs Reviewed  CBC WITH DIFFERENTIAL/PLATELET - Abnormal; Notable for the following components:      Result Value   WBC 18.7 (*)    RBC 3.53 (*)    Hemoglobin 10.6 (*)    HCT 32.8 (*)    Neutro Abs 14.9 (*)    Monocytes Absolute 2.2 (*)    All other components within normal limits  BASIC METABOLIC PANEL - Abnormal; Notable for the following components:   Sodium 133 (*)    Chloride 100 (*)    Glucose, Bld 151 (*)    BUN 54 (*)    Creatinine, Ser 2.80 (*)    GFR calc non Af Amer 16 (*)    GFR calc Af Amer 19 (*)    All other components within normal limits    EKG None  Radiology Dg Foot Complete Right  Result Date: 12/21/2017 CLINICAL DATA:  Right foot pain and redness of the amputation site. EXAM: RIGHT FOOT COMPLETE - 3+ VIEW COMPARISON:  04/27/2009 FINDINGS: There has been interval amputation of the right foot at the level of the tarsometatarsal joints. There is soft tissue thickening overlying the amputation site. Deep ulcer is seen within the soft tissues in the medial aspect of the amputation site. With small amount of soft tissue emphysema. No osteolytic changes of the underlying osseous structures. IMPRESSION: Status post amputation at the level of the tarsometatarsal joints. Significant soft tissue swelling at the amputation site with a deep ulcer medially, and small amount of soft tissue emphysema. Findings are suspicious for cellulitis/fasciitis, possibly necrotizing. No radiographic evidence of osteomyelitis. Electronically Signed   By: Fidela Salisbury M.D.   On: 12/21/2017 13:06    Procedures Procedures (including critical care time)  Medications Ordered in ED Medications  vancomycin (VANCOCIN) IVPB 1000 mg/200 mL premix (1,000 mg Intravenous New Bag/Given 12/21/17 1224)    sodium chloride 0.9 % bolus 1,000 mL (has no administration in time range)  morphine 4 MG/ML injection 4 mg (4 mg Intravenous Given 12/21/17 1219)  ondansetron (ZOFRAN) injection 4 mg (4 mg Intravenous Given 12/21/17 1217)     Initial Impression / Assessment and Plan / ED Course  I have reviewed the triage vital signs and the nursing notes.  Pertinent labs & imaging results that were available during my care of the patient were reviewed by me and considered in my medical decision making (see chart for details).     Discussed imaging and lab results with patient and recommended admission for further IV abx.  Pt agreeable.  Discussed creatinine and renal function. Pt is followed by nephrology here in [place]  for renal insufficiency, but is not aware of her baseline creatinine.  Was told she is "stable" but reduced function.  IV vancomycin given.  Pt comfortable currently.   Discussed with Dr. Roderic Palau who agrees with admission. Will see in ed.  Final Clinical Impressions(s) / ED Diagnoses   Final diagnoses:  Cellulitis of right lower extremity  Diabetic ulcer of right midfoot associated with type 2 diabetes mellitus, with other ulcer severity [facility])  Hyperglycemia  Renal insufficiency    ED Discharge Orders    None       Landis Martins 12/21/17 1406    Noemi Chapel, MD 12/21/17 [phone]

## 2017-12-21 NOTE — Consult Note (Signed)
[facility] Nurse wound consult note Reason for Consult: Full thickness neuropathic ulcer with surrounding callous on plantar aspect of right foot at 2nd metatarsal head Wound type: Neuropathic Pressure Injury POA: N/A Measurement: Entire area measures 2.5cm x 3cm with ulcer measuring 1.4cm round.  Depth of ulcer is obscured by the presence of nonviable tissue. Wound bed:As described above. Black, soft necrotic tissue in wound bed. Drainage (amount, consistency, odor) Small amount of purulent drainage, strong, sour odor. Periwound: Callous, see above. Dressing procedure/placement/frequency: A surgical consult has been ordered.  I will implement a conservative POC using saline dressings to fill the defect topped with dry gauze dressings and changed twice daily. Note: Surgery's orders will supercede mine as I defer to their expertise. Ultimately, offloading of the foot will need to be addressed in follow up and a (likely) custom orthotic required.  [facility] nursing team will not follow, but will remain available to this patient, the nursing and medical teams.  Please re-consult if needed. Thanks, Maudie Flakes, MSN, RN, [place], Arther Abbott  Pager# [phone]

## 2017-12-21 NOTE — ED Triage Notes (Signed)
Pt c/o increased redness, pain, and malodorous drainage to a wound on RT foot. Pt also reports chills x 3 days.

## 2017-12-21 NOTE — ED Notes (Signed)
Gave patient diet ginger ale to drink as requested and approved by MD.

## 2017-12-22 ENCOUNTER — Inpatient Hospital Stay (HOSPITAL_COMMUNITY): Payer: Medicare Other

## 2017-12-22 DIAGNOSIS — L97418 Non-pressure chronic ulcer of right heel and midfoot with other specified severity: Secondary | ICD-10-CM

## 2017-12-22 DIAGNOSIS — E11621 Type 2 diabetes mellitus with foot ulcer: Secondary | ICD-10-CM

## 2017-12-22 DIAGNOSIS — L97402 Non-pressure chronic ulcer of unspecified heel and midfoot with fat layer exposed: Secondary | ICD-10-CM

## 2017-12-22 DIAGNOSIS — L97509 Non-pressure chronic ulcer of other part of unspecified foot with unspecified severity: Secondary | ICD-10-CM

## 2017-12-22 DIAGNOSIS — N170 Acute kidney failure with tubular necrosis: Secondary | ICD-10-CM

## 2017-12-22 DIAGNOSIS — E08621 Diabetes mellitus due to underlying condition with foot ulcer: Secondary | ICD-10-CM

## 2017-12-22 LAB — URINALYSIS, ROUTINE W REFLEX MICROSCOPIC
Bilirubin Urine: NEGATIVE
Glucose, UA: NEGATIVE mg/dL
Hgb urine dipstick: NEGATIVE
KETONES UR: NEGATIVE mg/dL
NITRITE: NEGATIVE
Protein, ur: 30 mg/dL — AB
Specific Gravity, Urine: 1.016 (ref 1.005–1.030)
pH: 5 (ref 5.0–8.0)

## 2017-12-22 LAB — CBC
HEMATOCRIT: 29.6 % — AB (ref 36.0–46.0)
HEMOGLOBIN: 9.3 g/dL — AB (ref 12.0–15.0)
MCH: 29.5 pg (ref 26.0–34.0)
MCHC: 31.4 g/dL (ref 30.0–36.0)
MCV: 94 fL (ref 78.0–100.0)
Platelets: 244 10*3/uL (ref 150–400)
RBC: 3.15 MIL/uL — ABNORMAL LOW (ref 3.87–5.11)
RDW: 13.5 % (ref 11.5–15.5)
WBC: 13 10*3/uL — AB (ref 4.0–10.5)

## 2017-12-22 LAB — COMPREHENSIVE METABOLIC PANEL
ALT: 14 U/L (ref 14–54)
ANION GAP: 7 (ref 5–15)
AST: 32 U/L (ref 15–41)
Albumin: 2.6 g/dL — ABNORMAL LOW (ref 3.5–5.0)
Alkaline Phosphatase: 56 U/L (ref 38–126)
BILIRUBIN TOTAL: 0.8 mg/dL (ref 0.3–1.2)
BUN: 57 mg/dL — ABNORMAL HIGH (ref 6–20)
CO2: 22 mmol/L (ref 22–32)
Calcium: 8.2 mg/dL — ABNORMAL LOW (ref 8.9–10.3)
Chloride: 103 mmol/L (ref 101–111)
Creatinine, Ser: 3.08 mg/dL — ABNORMAL HIGH (ref 0.44–1.00)
GFR calc Af Amer: 17 mL/min — ABNORMAL LOW (ref >60)
GFR, EST NON AFRICAN AMERICAN: 14 mL/min — AB (ref >60)
Glucose, Bld: 102 mg/dL — ABNORMAL HIGH (ref 65–99)
POTASSIUM: 4.8 mmol/L (ref 3.5–5.1)
Sodium: 132 mmol/L — ABNORMAL LOW (ref 135–145)
TOTAL PROTEIN: 5.9 g/dL — AB (ref 6.5–8.1)

## 2017-12-22 LAB — SODIUM, URINE, RANDOM: Sodium, Ur: 24 mmol/L

## 2017-12-22 LAB — GLUCOSE, CAPILLARY
GLUCOSE-CAPILLARY: 98 mg/dL (ref 65–99)
GLUCOSE-CAPILLARY: 130 mg/dL — AB (ref 65–99)
GLUCOSE-CAPILLARY: 115 mg/dL — AB (ref 65–99)
Glucose-Capillary: 119 mg/dL — ABNORMAL HIGH (ref 65–99)

## 2017-12-22 LAB — CREATININE, URINE, RANDOM: Creatinine, Urine: 193.77 mg/dL

## 2017-12-22 LAB — C-REACTIVE PROTEIN: CRP: 10.8 mg/dL — ABNORMAL HIGH (ref <1.0)

## 2017-12-22 LAB — HEMOGLOBIN A1C
Hgb A1c MFr Bld: 6.3 % — ABNORMAL HIGH (ref 4.8–5.6)
MEAN PLASMA GLUCOSE: 134.11 mg/dL

## 2017-12-22 LAB — PREALBUMIN: PREALBUMIN: 13.2 mg/dL — AB (ref 18–38)

## 2017-12-22 MED ORDER — SODIUM CHLORIDE 0.9 % IV SOLN
1.0000 g | Freq: Three times a day (TID) | INTRAVENOUS | Status: DC
  Administered 2017-12-22 – 2017-12-24 (×6): 1 g via INTRAVENOUS
  Filled 2017-12-22 (×9): qty 1

## 2017-12-22 MED ORDER — DEXTROSE 5 % IV SOLN
0.5000 g | Freq: Three times a day (TID) | INTRAVENOUS | Status: DC
  Filled 2017-12-22 (×3): qty 0.5

## 2017-12-22 NOTE — Progress Notes (Signed)
PROGRESS NOTE                                                                                                                                                                                                             Patient Demographics:    Courtney Schneider, is a 72 y.o. female, DOB - [birth year], YYQ:[id number]  Admit date - 12/21/2017   Admitting Physician Kathie Dike, MD  Outpatient Primary MD for the patient is Celene Squibb, MD  LOS - 1  Outpatient Specialists: Dr. Theador Hawthorne (nephrology)  Chief Complaint  Patient presents with  . Foot Problem       Brief Narrative 72 year old female with uncontrolled diabetes mellitus, insulin-dependent, right foot gangrene status post transmetatarsal amputation in 2010, chronic kidney disease stage III (baseline creatinine not known but follows with nephrologist) presented with right foot plantar wound for several weeks.  She was seeing her PCP and undergoing dressing changes with some debridement in the office.  However for the past several days she has noticed some foul-smelling drainage coming from the wound with worsening erythema and swelling of the foot.  Also reported subjective fevers with chills at home. In the ED she had significant WBC of 18.7 and creatinine of 2.8. X-ray of the foot was negative for osteomyelitis but did show some gas bubbles. Patient admitted to hospitalist service for further management and surgery consulted.   Subjective:   She denies any pain on the right foot.  No overnight events.  Assessment  & Plan :    Active Problems:   Cellulitis/infected diabetic foot ulcer On empiric clindamycin and aztreonam.  Follow blood and superficial wound culture.  A elevated ESR.  Seen by surgery and recommends given improving clinical symptoms continue with nonsurgical management and empiric antibiotics.  MRI of the foot to rule out osteomyelitis and determine  duration of antibiotics. Arterial ABI shows elevated ABIs suggesting nondiagnostic study due to incomplete stable vessel calcification.  Recommends MRA runoff if patient failed conservative treatment. Pain control with PRN Vicodin.  If failed conservative management patient may need orthopedic evaluation for amputation.   Acute knee injury on chronic kidney disease stage III (HCC) Creatinine of 1.87 about 2 years back.  Follows with nephrologist Dr. Theador Hawthorne and has been informed that her renal function is stable.  Avoid nephrotoxins.  Check urine lites and  renal ultrasound.  Continue IV hydration and hold Lasix. Consult nephrology if renal function fails to improve.   Hyperlipidemia Continue statin    Insulin dependent type 2 diabetes mellitus with hyperglycemia (HCC) Continue home dose Lantus with sliding scale coverage.  Check A1c.  Anemia of chronic kidney disease Monitor H&H.    Code Status : Full code  Family Communication  : Daughter at bedside  Disposition Plan  : Home pending clinical improvement and work-up  Barriers For Discharge : Active symptoms  Consults  : Surgery  Procedures  : ABI of right foot, pending MRI of the foot  DVT Prophylaxis  : Heparin  Lab Results  Component Value Date   PLT 244 12/22/2017    Antibiotics  :    Anti-infectives (From admission, onward)   Start     Dose/Rate Route Frequency Ordered Stop   12/21/17 1700  clindamycin (CLEOCIN) IVPB 900 mg     900 mg 100 mL/hr over 30 Minutes Intravenous Every 8 hours 12/21/17 1637     12/21/17 1700  aztreonam (AZACTAM) 1 g in sodium chloride 0.9 % 100 mL IVPB     1 g 200 mL/hr over 30 Minutes Intravenous Every 8 hours 12/21/17 1606     12/21/17 1200  vancomycin (VANCOCIN) IVPB 1000 mg/200 mL premix     1,000 mg 200 mL/hr over 60 Minutes Intravenous  Once 12/21/17 1155 12/21/17 1330        Objective:   Vitals:   12/21/17 1530 12/21/17 1626 12/21/17 2133 12/22/17 0431  BP: (!) 110/43  (!) 121/57 (!) 126/27 119/76  Pulse:  75 74 70  Resp:  _0 Temp:  98.3 F (36.8 C) 98.9 F (37.2 C) 98.3 F (36.8 C)  TempSrc:  Oral Oral Oral  SpO2:  99% 97% 99%  Weight:  115.3 kg (254 lb 3.1 oz)    Height:  _1  (1.626 m)      Wt Readings from Last 3 Encounters:  12/21/17 115.3 kg (254 lb 3.1 oz)  09/10/15 106.6 kg (235 lb)     Intake/Output Summary (Last 24 hours) at 12/22/2017 1055 Last data filed at 12/22/2017 0700 Gross per 24 hour  Intake 1903.75 ml  Output -  Net 1903.75 ml     Physical Exam  Gen: not in distress HEENT: Pallor present, moist mucosa, supple neck Chest: clear b/l, no added sounds CVS: N S1&S2, no murmurs, GI: soft, NT, ND, Musculoskeletal: warm, has metatarsal amputation of the right foot, erythema of the right foot with ulcer measuring 1 cm on the plantar surface without any discharge or tenderness.  Absent sensation to touch.  Unable to palpate dorsalis pedis.    Data Review:    CBC Recent Labs  Lab 12/21/17 1159 12/22/17 0626  WBC 18.7* 13.0*  HGB 10.6* 9.3*  HCT 32.8* 29.6*  PLT 248 244  MCV 92.9 94.0  MCH 30.0 29.5  MCHC 32.3 31.4  RDW 13.2 13.5  LYMPHSABS 1.6  --   MONOABS 2.2*  --   EOSABS 0.0  --   BASOSABS 0.0  --     Chemistries  Recent Labs  Lab 12/21/17 1159 12/22/17 0626  NA 133* 132*  K 4.7 4.8  CL 100* 103  CO2 22 22  GLUCOSE 151* 102*  BUN 54* 57*  CREATININE 2.80* 3.08*  CALCIUM 8.9 8.2*  AST  --  32  ALT  --  14  ALKPHOS  --  56  BILITOT  --  0.8   ------------------------------------------------------------------------------------------------------------------ No results for input(s): CHOL, HDL, LDLCALC, TRIG, CHOLHDL, LDLDIRECT in the last 72 hours.  Lab Results  Component Value Date   HGBA1C (H) 04/28/2009    13.4 (NOTE) The ADA recommends the following therapeutic goal for glycemic control related to Hgb A1c measurement: Goal of therapy: <6.5 Hgb A1c  Reference: American Diabetes  Association: Clinical Practice Recommendations 2010, Diabetes Care, 2010, 33: (Suppl  1).   ------------------------------------------------------------------------------------------------------------------ No results for input(s): TSH, T4TOTAL, T3FREE, THYROIDAB in the last 72 hours.  Invalid input(s): FREET3 ------------------------------------------------------------------------------------------------------------------ No results for input(s): VITAMINB12, FOLATE, FERRITIN, TIBC, IRON, RETICCTPCT in the last 72 hours.  Coagulation profile No results for input(s): INR, PROTIME in the last 168 hours.  No results for input(s): DDIMER in the last 72 hours.  Cardiac Enzymes No results for input(s): CKMB, TROPONINI, MYOGLOBIN in the last 168 hours.  Invalid input(s): CK ------------------------------------------------------------------------------------------------------------------ No results found for: BNP  Inpatient Medications  Scheduled Meds: . aspirin EC  81 mg Oral Daily  . enoxaparin (LOVENOX) injection  30 mg Subcutaneous Q24H  . insulin aspart  0-15 Units Subcutaneous TID WC  . insulin aspart  0-5 Units Subcutaneous QHS  . insulin glargine  26 Units Subcutaneous QHS  . pravastatin  40 mg Oral q1800   Continuous Infusions: . sodium chloride 125 mL/hr at 12/22/17 0315  . aztreonam Stopped (12/22/17 0119)  . clindamycin (CLEOCIN) IV 900 mg (12/22/17 0917)   PRN Meds:.acetaminophen **OR** acetaminophen, HYDROcodone-acetaminophen, ondansetron **OR** ondansetron (ZOFRAN) IV  Micro Results Recent Results (from the past 240 hour(s))  Blood Cultures x 2 sites     Status: None (Preliminary result)   Collection Time: 12/21/17  5:13 PM  Result Value Ref Range Status   Specimen Description LEFT ANTECUBITAL  Final   Special Requests   Final    BOTTLES DRAWN AEROBIC AND ANAEROBIC Blood Culture adequate volume   Culture   Final    NO GROWTH < 24 HOURS Performed at [facility], [address], [place], [place] [address]    Report Status PENDING  Incomplete  Blood Cultures x 2 sites     Status: None (Preliminary result)   Collection Time: 12/21/17  5:20 PM  Result Value Ref Range Status   Specimen Description BLOOD RIGHT ARM  Final   Special Requests   Final    BOTTLES DRAWN AEROBIC AND ANAEROBIC Blood Culture adequate volume   Culture   Final    NO GROWTH < 24 HOURS Performed at [facility], [address], [place], [place] [address]    Report Status PENDING  Incomplete    Radiology Reports US Renal  Result Date: 12/22/2017 CLINICAL DATA:  Chronic renal disease, stage III. EXAM: RENAL / URINARY TRACT ULTRASOUND COMPLETE COMPARISON:  02/27/2017 FINDINGS: Right Kidney: Length: 11.7 cm. Echogenicity within normal limits. No mass or hydronephrosis visualized. Left Kidney: Length: 10.7 cm. Echogenicity within normal limits. No mass or hydronephrosis visualized. Linear nonobstructive calculus measuring 12 mm in the lower pole of the left kidney. Bladder: Nondistended and therefore not well evaluated. IMPRESSION: Study limited by body habitus. Left nonobstructive nephrolithiasis. Normal cortical thickness the kidneys. Nondistended and therefore not well evaluated urinary bladder. Electronically Signed   By: Fidela Salisbury M.D.   On: 12/22/2017 10:45   US Arterial Abi (screening Lower Extremity)  Result Date: 12/22/2017 CLINICAL DATA:  Previous right transmetatarsal amputation 2010. Plantar ulcer times weeks. Diabetes, hypertension, hyperlipidemia, obesity. EXAM: NONINVASIVE PHYSIOLOGIC VASCULAR STUDY OF BILATERAL LOWER EXTREMITIES TECHNIQUE:  Evaluation of both lower extremities were performed at rest, including calculation of ankle-brachial indices with single level Doppler, pressure and pulse volume recording. COMPARISON:  05/03/2009 FINDINGS: Right ABI:  1.24 Left ABI: 1.53 Right Lower Extremity:  Biphasic distal arterial waveforms. Left Lower Extremity:  Biphasic  distal arterial waveforms. IMPRESSION: Elevated ABIs suggesting non diagnostic secondary to incompressible vessel calcifications (medial arterial sclerosis of [place]). Should the patient fail conservative treatment, consider MRA runoff (preferred over the CTA in the setting of vascular calcifications; no radiation risk, can be performed noncontrast in the setting of renal dysfunction) to better define the site and nature of arterial occlusive disease and delineate treatment options. Electronically Signed   By: Lucrezia Europe M.D.   On: 12/22/2017 10:50   Dg Foot Complete Right  Result Date: 12/21/2017 CLINICAL DATA:  Right foot pain and redness of the amputation site. EXAM: RIGHT FOOT COMPLETE - 3+ VIEW COMPARISON:  04/27/2009 FINDINGS: There has been interval amputation of the right foot at the level of the tarsometatarsal joints. There is soft tissue thickening overlying the amputation site. Deep ulcer is seen within the soft tissues in the medial aspect of the amputation site. With small amount of soft tissue emphysema. No osteolytic changes of the underlying osseous structures. IMPRESSION: Status post amputation at the level of the tarsometatarsal joints. Significant soft tissue swelling at the amputation site with a deep ulcer medially, and small amount of soft tissue emphysema. Findings are suspicious for cellulitis/fasciitis, possibly necrotizing. No radiographic evidence of osteomyelitis. Electronically Signed   By: Fidela Salisbury M.D.   On: 12/21/2017 13:06    Time Spent in minutes  25   Emeli Goguen M.D on 12/22/2017 at 10:55 AM  Between 7am to 7pm - Pager - [phone]  After 7pm go to [email or web address] - password [facility]  [facility] -  Office  [phone]

## 2017-12-22 NOTE — Consult Note (Signed)
Reason for Consult: Cellulitis, right foot Referring Physician: Dr. [name] is an 72 y.o. female.  HPI: Patient is a 72 year old white female with a history of diabetes mellitus and peripheral vascular disease, status post transmetatarsal amputation of the right foot in the remote past who presented to [facility] with worsening erythema and drainage from a wound along the sole of the remaining right foot.  Patient has been treated as an outpatient by her primary care physician for an open callus with wound.  Apparently, the foreign body was removed approximately 1 month ago.  Due to increasing erythema that was noticed by the patient's daughter, the patient was brought to the emergency room for further evaluation and treatment.  She was noted to have a leukocytosis.  She does not have feeling in the foot.  She was admitted to the hospital for IV antibiotics.  Wound care has been consulted.  Past Medical History:  Diagnosis Date  . Diabetes mellitus without complication ([place])   . Hypertension     Past Surgical History:  Procedure Laterality Date  . AMPUTATION TOE    . CHOLECYSTECTOMY      No family history on file.  Social History:  reports that she has never smoked. She has never used smokeless tobacco. She reports that she does not drink alcohol or use drugs.  Allergies: No Known Allergies  Medications: I have reviewed the patient's current medications.  Results for orders placed or performed during the hospital encounter of 12/21/17 (from the past 48 hour(s))  CBC with Differential     Status: Abnormal   Collection Time: 12/21/17 11:59 AM  Result Value Ref Range   WBC 18.7 (H) 4.0 - 10.5 K/uL   RBC 3.53 (L) 3.87 - 5.11 MIL/uL   Hemoglobin 10.6 (L) 12.0 - 15.0 g/dL   HCT 32.8 (L) 36.0 - 46.0 %   MCV 92.9 78.0 - 100.0 fL   MCH 30.0 26.0 - 34.0 pg   MCHC 32.3 30.0 - 36.0 g/dL   RDW 13.2 11.5 - 15.5 %   Platelets 248 150 - 400 K/uL   Neutrophils  Relative % 79 %   Neutro Abs 14.9 (H) 1.7 - 7.7 K/uL   Lymphocytes Relative 9 %   Lymphs Abs 1.6 0.7 - 4.0 K/uL   Monocytes Relative 12 %   Monocytes Absolute 2.2 (H) 0.1 - 1.0 K/uL   Eosinophils Relative 0 %   Eosinophils Absolute 0.0 0.0 - 0.7 K/uL   Basophils Relative 0 %   Basophils Absolute 0.0 0.0 - 0.1 K/uL    Comment: Performed at [facility], [address] [place], [place] [address]  Basic metabolic panel     Status: Abnormal   Collection Time: 12/21/17 11:59 AM  Result Value Ref Range   Sodium 133 (L) 135 - 145 mmol/L   Potassium 4.7 3.5 - 5.1 mmol/L   Chloride 100 (L) 101 - 111 mmol/L   CO2 22 22 - 32 mmol/L   Glucose, Bld 151 (H) 65 - 99 mg/dL   BUN 54 (H) 6 - 20 mg/dL   Creatinine, Ser 2.80 (H) 0.44 - 1.00 mg/dL   Calcium 8.9 8.9 - 10.3 mg/dL   GFR calc non Af Amer 16 (L) >60 mL/min   GFR calc Af Amer 19 (L) >60 mL/min    Comment: (NOTE) The eGFR has been calculated using the CKD EPI equation. This calculation has not been validated in all clinical situations. eGFR's persistently <60  mL/min signify possible Chronic Kidney Disease.    Anion gap 11 5 - 15    Comment: Performed at [facility], [address] [place], [place] [address]  Sedimentation rate     Status: Abnormal   Collection Time: 12/21/17  5:12 PM  Result Value Ref Range   Sed Rate 95 (H) 0 - 22 mm/hr    Comment: Performed at [facility], [address], [place], [place] [address]  Blood Cultures x 2 sites     Status: None (Preliminary result)   Collection Time: 12/21/17  5:13 PM  Result Value Ref Range   Specimen Description LEFT ANTECUBITAL    Special Requests      BOTTLES DRAWN AEROBIC AND ANAEROBIC Blood Culture adequate volume   Culture      NO GROWTH < 24 HOURS Performed at [facility], [address], [place], [place] [address]    Report Status PENDING   Blood Cultures x 2 sites     Status: None (Preliminary result)   Collection Time: 12/21/17  5:20 PM  Result Value Ref Range    Specimen Description BLOOD RIGHT ARM    Special Requests      BOTTLES DRAWN AEROBIC AND ANAEROBIC Blood Culture adequate volume   Culture      NO GROWTH < 24 HOURS Performed at [facility], [address] [place], [place] [address]    Report Status PENDING   Glucose, capillary     Status: Abnormal   Collection Time: 12/21/17  5:39 PM  Result Value Ref Range   Glucose-Capillary 112 (H) 65 - 99 mg/dL  Glucose, capillary     Status: Abnormal   Collection Time: 12/21/17  9:41 PM  Result Value Ref Range   Glucose-Capillary 152 (H) 65 - 99 mg/dL   Comment 1 Notify RN    Comment 2 Document in Chart   Comprehensive metabolic panel     Status: Abnormal   Collection Time: 12/22/17  6:26 AM  Result Value Ref Range   Sodium 132 (L) 135 - 145 mmol/L   Potassium 4.8 3.5 - 5.1 mmol/L   Chloride 103 101 - 111 mmol/L   CO2 22 22 - 32 mmol/L   Glucose, Bld 102 (H) 65 - 99 mg/dL   BUN 57 (H) 6 - 20 mg/dL   Creatinine, Ser 3.08 (H) 0.44 - 1.00 mg/dL   Calcium 8.2 (L) 8.9 - 10.3 mg/dL   Total Protein 5.9 (L) 6.5 - 8.1 g/dL   Albumin 2.6 (L) 3.5 - 5.0 g/dL   AST 32 15 - 41 U/L   ALT 14 14 - 54 U/L   Alkaline Phosphatase 56 38 - 126 U/L   Total Bilirubin 0.8 0.3 - 1.2 mg/dL   GFR calc non Af Amer 14 (L) >60 mL/min   GFR calc Af Amer 17 (L) >60 mL/min    Comment: (NOTE) The eGFR has been calculated using the CKD EPI equation. This calculation has not been validated in all clinical situations. eGFR's persistently <60 mL/min signify possible Chronic Kidney Disease.    Anion gap 7 5 - 15    Comment: Performed at [facility], [address] [place],  [address]  CBC     Status: Abnormal   Collection Time: 12/22/17  6:26 AM  Result Value Ref Range   WBC 13.0 (H) 4.0 - 10.5 K/uL   RBC 3.15 (L) 3.87 - 5.11 MIL/uL   Hemoglobin 9.3 (L) 12.0 - 15.0 g/dL   HCT 29.6 (  L) 36.0 - 46.0 %   MCV 94.0 78.0 - 100.0 fL   MCH 29.5 26.0 - 34.0 pg   MCHC 31.4 30.0 - 36.0 g/dL   RDW 13.5 11.5 - 15.5 %    Platelets 244 150 - 400 K/uL    Comment: Performed at [facility], [address] [place], [place] [address]  Glucose, capillary     Status: None   Collection Time: 12/22/17  7:39 AM  Result Value Ref Range   Glucose-Capillary 98 65 - 99 mg/dL    Dg Foot Complete Right  Result Date: 12/21/2017 CLINICAL DATA:  Right foot pain and redness of the amputation site. EXAM: RIGHT FOOT COMPLETE - 3+ VIEW COMPARISON:  04/27/2009 FINDINGS: There has been interval amputation of the right foot at the level of the tarsometatarsal joints. There is soft tissue thickening overlying the amputation site. Deep ulcer is seen within the soft tissues in the medial aspect of the amputation site. With small amount of soft tissue emphysema. No osteolytic changes of the underlying osseous structures. IMPRESSION: Status post amputation at the level of the tarsometatarsal joints. Significant soft tissue swelling at the amputation site with a deep ulcer medially, and small amount of soft tissue emphysema. Findings are suspicious for cellulitis/fasciitis, possibly necrotizing. No radiographic evidence of osteomyelitis. Electronically Signed   By: Fidela Salisbury M.D.   On: 12/21/2017 13:06    ROS:  Pertinent items are noted in HPI.  Blood pressure 119/76, pulse 70, temperature 98.3 F (36.8 C), temperature source Oral, resp. rate 20, height _0  (1.626 m), weight 254 lb 3.1 oz (115.3 kg), SpO2 99 %. Physical Exam: Pleasant white female in no acute distress Head is normocephalic, atraumatic Lungs clear to auscultation with equal breath sounds bilaterally Heart examination reveals a regular rate and rhythm without S3, S4, murmurs Extremity examination reveals a high transmetatarsal amputation of the right foot.  No dorsalis pedis pulse could be identified.  No palpable posterior tibial pulses noted.  The distal flap has been 1-1/2 cm opening at the center of a callus that has formed.  Erythematous soft tissue was  noted in this region, but no purulent drainage is present on my examination.  No ascending erythema is noted to the ankle level.  Pictures were reviewed.  Assessment/Plan: Impression: Cellulitis of right foot, status post right partial foot amputation in the remote past.  Leukocytosis resolving, which is reassuring. Plan: Will need MRI of right foot to rule out osteomyelitis.  Segmental Doppler studies have been ordered.  Further management is pending those results.  No need for acute surgical intervention at this time.  Aviva Signs 12/22/2017, 9:50 AM

## 2017-12-22 NOTE — Progress Notes (Deleted)
Pharmacy Antibiotic Note  Courtney Schneider is a 72 y.o. female admitted on 12/21/2017 with right food gangrene .  Pharmacy has been consulted for Aztreonam dosing.  Plan: Decrease aztreonam to 500mg  IV every 8 hours. Continue to monitor labs, micro and vitals.   Height: 5\' 4"  (162.6 cm) Weight: 254 lb 3.1 oz (115.3 kg) IBW/kg (Calculated) : 54.7  Temp (24hrs), Avg:98.5 F (36.9 C), Min:98.3 F (36.8 C), Max:98.9 F (37.2 C)  Recent Labs  Lab 12/21/17 1159 12/22/17 0626  WBC 18.7* 13.0*  CREATININE 2.80* 3.08*    Estimated Creatinine Clearance: 21.5 mL/min (A) (by C-G formula based on SCr of 3.08 mg/dL (H)).    No Known Allergies  Antimicrobials this admission: Aztreonam 5/25 >>    >>   Dose adjustments this admission: n/a   Microbiology results:  5/25 [facility] x2: NG x1 day   5/25 Wound Cx: in progress   Thank you for allowing pharmacy to be a part of this patient's care.  Despina Pole 12/22/2017 1:35 PM

## 2017-12-23 DIAGNOSIS — L03115 Cellulitis of right lower limb: Secondary | ICD-10-CM

## 2017-12-23 LAB — BASIC METABOLIC PANEL
Anion gap: 9 (ref 5–15)
BUN: 60 mg/dL — AB (ref 6–20)
CALCIUM: 7.8 mg/dL — AB (ref 8.9–10.3)
CHLORIDE: 105 mmol/L (ref 101–111)
CO2: 17 mmol/L — ABNORMAL LOW (ref 22–32)
CREATININE: 3.09 mg/dL — AB (ref 0.44–1.00)
GFR calc Af Amer: 17 mL/min — ABNORMAL LOW (ref >60)
GFR calc non Af Amer: 14 mL/min — ABNORMAL LOW (ref >60)
Glucose, Bld: 102 mg/dL — ABNORMAL HIGH (ref 65–99)
Potassium: 4.7 mmol/L (ref 3.5–5.1)
Sodium: 131 mmol/L — ABNORMAL LOW (ref 135–145)

## 2017-12-23 LAB — GLUCOSE, CAPILLARY
GLUCOSE-CAPILLARY: 110 mg/dL — AB (ref 65–99)
GLUCOSE-CAPILLARY: 121 mg/dL — AB (ref 65–99)
GLUCOSE-CAPILLARY: 131 mg/dL — AB (ref 65–99)
Glucose-Capillary: 89 mg/dL (ref 65–99)
Glucose-Capillary: 88 mg/dL (ref 65–99)

## 2017-12-23 LAB — CREATININE, URINE, RANDOM: Creatinine, Urine: 70.43 mg/dL

## 2017-12-23 LAB — HIV ANTIBODY (ROUTINE TESTING W REFLEX): HIV Screen 4th Generation wRfx: NONREACTIVE

## 2017-12-23 LAB — SODIUM, URINE, RANDOM: Sodium, Ur: 44 mmol/L

## 2017-12-23 MED ORDER — GLUCERNA SHAKE PO LIQD
237.0000 mL | Freq: Two times a day (BID) | ORAL | Status: DC
  Administered 2017-12-23: 237 mL via ORAL

## 2017-12-23 MED ORDER — ZOLPIDEM TARTRATE 5 MG PO TABS
5.0000 mg | ORAL_TABLET | Freq: Every evening | ORAL | Status: DC | PRN
  Administered 2017-12-23 – 2017-12-24 (×2): 5 mg via ORAL
  Filled 2017-12-23 (×2): qty 1

## 2017-12-23 MED ORDER — ADULT MULTIVITAMIN W/MINERALS CH
1.0000 | ORAL_TABLET | Freq: Every day | ORAL | Status: DC
  Administered 2017-12-23: 1 via ORAL
  Filled 2017-12-23 (×2): qty 1

## 2017-12-23 NOTE — Clinical Social Work Note (Signed)
Received CSW consult for assistance with medications at dc. Will update RN CM who can assist with this need. Will clear the consult. Will be available if any CSW needs arise before dc.

## 2017-12-23 NOTE — Care Management (Signed)
CM consult noted. MRI pending. CM will follow for needs.

## 2017-12-23 NOTE — Consult Note (Signed)
Reason for Consult: Acute on chronic renal failure Referring Physician: Dr. [name] is an 72 y.o. female.  HPI: She is a patient who has history of long-standing diabetes, hypertension, chronic renal failure stage IV presently came because of non-healing wound on her right foot.  Patient transmetatarsal amputation of her right foot.  On the same food she developed some callus.  According to her that continued to get worse to the point where she started pain, erythema and drainage which is being followed by her primary care physician.  She started having some weakness and worsening of her wound and decided to come to the hospital.  Presently she was found to have cellulitis and started on antibiotics.  Consult is called because of worsening of renal failure.    Past Medical History:  Diagnosis Date  . Diabetes mellitus without complication ([place])   . Hypertension     Past Surgical History:  Procedure Laterality Date  . AMPUTATION TOE    . CHOLECYSTECTOMY      No family history on file.  Social History:  reports that she has never smoked. She has never used smokeless tobacco. She reports that she does not drink alcohol or use drugs.  Allergies: No Known Allergies  Medications: I have reviewed the patient's current medications.  Results for orders placed or performed during the hospital encounter of 12/21/17 (from the past 48 hour(s))  Sedimentation rate     Status: Abnormal   Collection Time: 12/21/17  5:12 PM  Result Value Ref Range   Sed Rate 95 (H) 0 - 22 mm/hr    Comment: Performed at [facility], [address], [place], [place] [address]  C-reactive protein     Status: Abnormal   Collection Time: 12/21/17  5:12 PM  Result Value Ref Range   CRP 10.8 (H) <1.0 mg/dL    Comment: Performed at [facility] [address], [place], [place] [address]  Prealbumin     Status: Abnormal   Collection Time: 12/21/17  5:12 PM  Result Value Ref Range   Prealbumin  13.2 (L) 18 - 38 mg/dL    Comment: Performed at [facility] [address], [place], [place] [address]  Blood Cultures x 2 sites     Status: None (Preliminary result)   Collection Time: 12/21/17  5:13 PM  Result Value Ref Range   Specimen Description LEFT ANTECUBITAL    Special Requests      BOTTLES DRAWN AEROBIC AND ANAEROBIC Blood Culture adequate volume   Culture      NO GROWTH 2 DAYS Performed at [facility], [address], [place], [place] [address]    Report Status PENDING   Blood Cultures x 2 sites     Status: None (Preliminary result)   Collection Time: 12/21/17  5:20 PM  Result Value Ref Range   Specimen Description BLOOD RIGHT ARM    Special Requests      BOTTLES DRAWN AEROBIC AND ANAEROBIC Blood Culture adequate volume   Culture      NO GROWTH 2 DAYS Performed at [facility], [address] [place], [place] [address]    Report Status PENDING   Glucose, capillary     Status: Abnormal   Collection Time: 12/21/17  5:39 PM  Result Value Ref Range   Glucose-Capillary 112 (H) 65 - 99 mg/dL  Glucose, capillary     Status: Abnormal   Collection Time: 12/21/17  9:41 PM  Result Value  Ref Range   Glucose-Capillary 152 (H) 65 - 99 mg/dL   Comment 1 Notify RN    Comment 2 Document in Chart   Comprehensive metabolic panel     Status: Abnormal   Collection Time: 12/22/17  6:26 AM  Result Value Ref Range   Sodium 132 (L) 135 - 145 mmol/L   Potassium 4.8 3.5 - 5.1 mmol/L   Chloride 103 101 - 111 mmol/L   CO2 22 22 - 32 mmol/L   Glucose, Bld 102 (H) 65 - 99 mg/dL   BUN 57 (H) 6 - 20 mg/dL   Creatinine, Ser 3.08 (H) 0.44 - 1.00 mg/dL   Calcium 8.2 (L) 8.9 - 10.3 mg/dL   Total Protein 5.9 (L) 6.5 - 8.1 g/dL   Albumin 2.6 (L) 3.5 - 5.0 g/dL   AST 32 15 - 41 U/L   ALT 14 14 - 54 U/L   Alkaline Phosphatase 56 38 - 126 U/L   Total Bilirubin 0.8 0.3 - 1.2 mg/dL   GFR calc non Af Amer 14 (L) >60 mL/min   GFR calc Af Amer 17 (L) >60 mL/min    Comment: (NOTE) The eGFR  has been calculated using the CKD EPI equation. This calculation has not been validated in all clinical situations. eGFR's persistently <60 mL/min signify possible Chronic Kidney Disease.    Anion gap 7 5 - 15    Comment: Performed at [facility], [address], [place], [place] [address]  CBC     Status: Abnormal   Collection Time: 12/22/17  6:26 AM  Result Value Ref Range   WBC 13.0 (H) 4.0 - 10.5 K/uL   RBC 3.15 (L) 3.87 - 5.11 MIL/uL   Hemoglobin 9.3 (L) 12.0 - 15.0 g/dL   HCT 29.6 (L) 36.0 - 46.0 %   MCV 94.0 78.0 - 100.0 fL   MCH 29.5 26.0 - 34.0 pg   MCHC 31.4 30.0 - 36.0 g/dL   RDW 13.5 11.5 - 15.5 %   Platelets 244 150 - 400 K/uL    Comment: Performed at [facility], [address] [place], [place] [address]  Aerobic Culture (superficial specimen)     Status: None (Preliminary result)   Collection Time: 12/22/17  6:53 AM  Result Value Ref Range   Specimen Description      FOOT RIGHT FOOT Performed at [facility], [address], [place], [place] [address]    Special Requests      NONE Performed at [facility], [address], [place], [place] [address]    Gram Stain      RARE WBC PRESENT, PREDOMINANTLY PMN ABUNDANT GRAM POSITIVE COCCI ABUNDANT GRAM NEGATIVE RODS FEW GRAM POSITIVE RODS Performed at [facility], [place] [address], [place], [place] [address]    Culture PENDING    Report Status PENDING   Glucose, capillary     Status: None   Collection Time: 12/22/17  7:39 AM  Result Value Ref Range   Glucose-Capillary 98 65 - 99 mg/dL  Sodium, urine, random     Status: None   Collection Time: 12/22/17  7:50 AM  Result Value Ref Range   Sodium, Ur 24 mmol/L    Comment: Performed at [facility], [address], [place], [place] [address]  Creatinine, urine, random     Status: None   Collection Time: 12/22/17  7:50 AM  Result Value Ref Range   Creatinine, Urine 193.77 mg/dL    Comment: Performed at [facility],  [address] [place], [place] [address]   Urinalysis, Routine w reflex microscopic     Status: Abnormal   Collection Time: 12/22/17  7:50 AM  Result Value Ref Range   Color, Urine YELLOW YELLOW   APPearance HAZY (A) CLEAR   Specific Gravity, Urine 1.016 1.005 - 1.030   pH 5.0 5.0 - 8.0   Glucose, UA NEGATIVE NEGATIVE mg/dL   Hgb urine dipstick NEGATIVE NEGATIVE   Bilirubin Urine NEGATIVE NEGATIVE   Ketones, ur NEGATIVE NEGATIVE mg/dL   Protein, ur 30 (A) NEGATIVE mg/dL   Nitrite NEGATIVE NEGATIVE   Leukocytes, UA MODERATE (A) NEGATIVE   RBC / HPF 0-5 0 - 5 RBC/hpf   WBC, UA 6-10 0 - 5 WBC/hpf   Bacteria, UA RARE (A) NONE SEEN   Squamous Epithelial / LPF 0-5 0 - 5   Mucus PRESENT    Hyaline Casts, UA PRESENT     Comment: Performed at [facility], [address] [place], [place] [address]  Glucose, capillary     Status: Abnormal   Collection Time: 12/22/17 11:23 AM  Result Value Ref Range   Glucose-Capillary 130 (H) 65 - 99 mg/dL  Glucose, capillary     Status: Abnormal   Collection Time: 12/22/17  4:11 PM  Result Value Ref Range   Glucose-Capillary 115 (H) 65 - 99 mg/dL  Glucose, capillary     Status: Abnormal   Collection Time: 12/22/17  9:17 PM  Result Value Ref Range   Glucose-Capillary 119 (H) 65 - 99 mg/dL   Comment 1 Notify RN    Comment 2 Document in Chart   Glucose, capillary     Status: Abnormal   Collection Time: 12/23/17  3:35 AM  Result Value Ref Range   Glucose-Capillary 110 (H) 65 - 99 mg/dL  Basic metabolic panel     Status: Abnormal   Collection Time: 12/23/17  6:06 AM  Result Value Ref Range   Sodium 131 (L) 135 - 145 mmol/L   Potassium 4.7 3.5 - 5.1 mmol/L   Chloride 105 101 - 111 mmol/L   CO2 17 (L) 22 - 32 mmol/L   Glucose, Bld 102 (H) 65 - 99 mg/dL   BUN 60 (H) 6 - 20 mg/dL   Creatinine, Ser 3.09 (H) 0.44 - 1.00 mg/dL   Calcium 7.8 (L) 8.9 - 10.3 mg/dL   GFR calc non Af Amer 14 (L) >60 mL/min   GFR calc Af Amer 17 (L) >60 mL/min    Comment: (NOTE) The eGFR has been calculated using  the CKD EPI equation. This calculation has not been validated in all clinical situations. eGFR's persistently <60 mL/min signify possible Chronic Kidney Disease.    Anion gap 9 5 - 15    Comment: Performed at [facility], [address], [place],  [address]  Glucose, capillary     Status: None   Collection Time: 12/23/17  7:38 AM  Result Value Ref Range   Glucose-Capillary 89 65 - 99 mg/dL   Comment 1 Notify RN    Comment 2 Document in Chart   Glucose, capillary     Status: Abnormal   Collection Time: 12/23/17 11:36 AM  Result Value Ref Range   Glucose-Capillary 121 (H) 65 - 99 mg/dL   Comment 1 Notify RN    Comment 2 Document in Chart     US Renal  Result Date: 12/22/2017 CLINICAL DATA:  Chronic renal disease, stage III. EXAM: RENAL / URINARY TRACT ULTRASOUND COMPLETE COMPARISON:  02/27/2017 FINDINGS: Right Kidney: Length: 11.7 cm. Echogenicity within normal limits. No mass or hydronephrosis visualized. Left Kidney: Length: 10.7 cm. Echogenicity within normal limits. No mass or hydronephrosis visualized. Linear nonobstructive calculus measuring 12 mm in the lower pole of the left kidney. Bladder: Nondistended and therefore not well evaluated. IMPRESSION: Study limited by body habitus. Left nonobstructive nephrolithiasis. Normal cortical thickness the kidneys. Nondistended and therefore not well evaluated urinary bladder. Electronically Signed   By: Fidela Salisbury M.D.   On: 12/22/2017 10:45   US Arterial Abi (screening Lower Extremity)  Result Date: 12/22/2017 CLINICAL DATA:  Previous right transmetatarsal amputation 2010. Plantar ulcer times weeks. Diabetes, hypertension, hyperlipidemia, obesity. EXAM: NONINVASIVE PHYSIOLOGIC VASCULAR STUDY OF BILATERAL LOWER EXTREMITIES TECHNIQUE: Evaluation of both lower extremities were performed at rest, including calculation of ankle-brachial indices with single level Doppler, pressure and pulse volume recording. COMPARISON:  05/03/2009  FINDINGS: Right ABI:  1.24 Left ABI: 1.53 Right Lower Extremity:  Biphasic distal arterial waveforms. Left Lower Extremity:  Biphasic distal arterial waveforms. IMPRESSION: Elevated ABIs suggesting non diagnostic secondary to incompressible vessel calcifications (medial arterial sclerosis of Monckeberg). Should the patient fail conservative treatment, consider MRA runoff (preferred over the CTA in the setting of vascular calcifications; no radiation risk, can be performed noncontrast in the setting of renal dysfunction) to better define the site and nature of arterial occlusive disease and delineate treatment options. Electronically Signed   By: Lucrezia Europe M.D.   On: 12/22/2017 10:50   Dg Foot Complete Right  Result Date: 12/21/2017 CLINICAL DATA:  Right foot pain and redness of the amputation site. EXAM: RIGHT FOOT COMPLETE - 3+ VIEW COMPARISON:  04/27/2009 FINDINGS: There has been interval amputation of the right foot at the level of the tarsometatarsal joints. There is soft tissue thickening overlying the amputation site. Deep ulcer is seen within the soft tissues in the medial aspect of the amputation site. With small amount of soft tissue emphysema. No osteolytic changes of the underlying osseous structures. IMPRESSION: Status post amputation at the level of the tarsometatarsal joints. Significant soft tissue swelling at the amputation site with a deep ulcer medially, and small amount of soft tissue emphysema. Findings are suspicious for cellulitis/fasciitis, possibly necrotizing. No radiographic evidence of osteomyelitis. Electronically Signed   By: Fidela Salisbury M.D.   On: 12/21/2017 13:06    Review of Systems  Constitutional: Positive for chills and malaise/fatigue. Negative for fever.  Respiratory: Negative for shortness of breath.   Cardiovascular: Positive for leg swelling.  Gastrointestinal: Negative for nausea and vomiting.   Blood pressure (!) 138/46, pulse 63, temperature 98.2 F  (36.8 C), temperature source Oral, resp. rate 18, height '5\' 4"'  (1.626 m), weight 115.3 kg (254 lb 3.1 oz), SpO2 99 %. Physical Exam  Constitutional: She is oriented to person, place, and time. No distress.  Eyes: Left eye exhibits no discharge.  Neck: No JVD present.  Cardiovascular: Normal rate and regular rhythm.  No murmur heard. Respiratory: No respiratory distress. She has no wheezes.  Musculoskeletal: She exhibits no edema.  Neurological: She is alert and oriented to person, place, and time.    Assessment/Plan: 1] diabetic foot ulcer: Presently she is on antibiotics.  She is afebrile and her white blood cell count is improving. 2] history of acute kidney injury superimposed on chronic.  Renal function seems to be worsening from her baseline.  Patient has chronic renal failure stage IV thought to be secondary to diabetes.  Patient is being followed in our office  as an outpatient.  The increasing creatinine could be secondary to prerenal/ATN. 3] history of diabetes 4] history of hypertension: Her blood pressure is reasonably controlled 5] proteinuria: None nephrotic range and possibly from diabetes versus hypertension. 6] anemia: Her hemoglobin is below target goal. 7] bone and mineral disorder: Her calcium is normal but phosphorus is not available Plan: Agree with hydration 2] I agree with holding ARB send diuretics 3] we will check urine sodium and creatinine 4] we will check a renal panel in the morning  Courtney Schneider S 12/23/2017, 12:11 PM

## 2017-12-23 NOTE — Progress Notes (Addendum)
Initial Nutrition Assessment  DOCUMENTATION CODES:   Morbid obesity  INTERVENTION:  Monitor intake to reach needs goals.  Juven BID, 80 kcal and 14g amino acids each.  Glucerna Shake po BID, each supplement provides 220 kcal and 10 grams of protein   Multivitamin daily  NUTRITION DIAGNOSIS:   Increased nutrient needs related to wound healing as evidenced by diabetic ulcer on right foot.  GOAL:   Patient will meet greater than or equal to 90% of their needs  MONITOR:   PO intake, Supplement acceptance, Skin  REASON FOR ASSESSMENT:   Consult Wound healing  ASSESSMENT:   72 y/o female with a Hx of DM2, CKD3, HTN, Afib, post transmetatarsal amputation of the right foot (2010).  Admitted for wound on right foot with drainage and worsening erythema and swelling around the area.     Pt's eats what she wants at home.  Does not follow any particular diet to manage DM2, just "does not eat a lot of sweets".  Pt relays diet of 3 meals/day with snacks:  Breakfast: cereal or eggs Lunch: something small - banana Dinner: frozen dinner Snacks: peanut butter crackers Before bed: milk and vanilla wafers sometimes  To manage DM2, pt takes lantus before bed and a pill in the morning.  Pt says morning glucose levels are usually around 120.  Pt also takes vitamin D3 and a preservision supplement at home.    Pt says UBW is around 230 lbs, however current weight is 254 lbs.  Last recorded hospital weight is 235 more than 2 years ago.    Currently pt says her appetite is down, but she is trying to eat to get her strength back.  She has been eating at least 50% of all meals, if not more.  Pt has not had vomiting, constipation, or diarrhea.  Pt says she had nausea when she first came to the hospital, but not since.    Medications: NovoLOG lantus  Labs: Reviewed A1C 6.3 Glucose 102 BUN 60 Creatinine 3.09 Recent Labs  Lab 12/21/17 1159 12/22/17 0626 12/23/17 0606  NA 133* 132* 131*   K 4.7 4.8 4.7  CL 100* 103 105  CO2 22 22 17*  BUN 54* 57* 60*  CREATININE 2.80* 3.08* 3.09*  CALCIUM 8.9 8.2* 7.8*  GLUCOSE 151* 102* 102*   NUTRITION - FOCUSED PHYSICAL EXAM:  Deferred: consult for wound healing, no signs of malnutrition to explore.  Diet Order:   Diet Order           Diet heart healthy/carb modified Room service appropriate? Yes; Fluid consistency: Thin  Diet effective now          EDUCATION NEEDS:   No education needs have been identified at this time  Skin:  Skin Assessment: Skin Integrity Issues: Skin Integrity Issues:: Diabetic Ulcer Diabetic Ulcer: Bottom of right foot: 85% yellow/fibrinous, 15% black/eschar, with erythema, minimal drainage that is serous and with an odor.  Last BM:  5/24  Height:   Ht Readings from Last 1 Encounters:  12/21/17 5\' 4"  (1.626 m)   Weight:   Wt Readings from Last 1 Encounters:  12/21/17 254 lb 3.1 oz (115.3 kg)   Ideal Body Weight:  54.4 kg  BMI:  Body mass index is 43.63 kg/m.  Estimated Nutritional Needs:   Kcal:  1630-1900 kcal (30-35 kcal/kg IBW)  Protein:  82-93g (0.8-0.9 g/kg Renal Adj. Wt)  Fluid:  1630-1900 mL (1 mL/kcal/kg IBW)

## 2017-12-23 NOTE — Progress Notes (Signed)
Subjective: Patient has no complaints.  Objective: Vital signs in last 24 hours: Temp:  [98.2 F (36.8 C)] 98.2 F (36.8 C) (05/27 0518) Pulse Rate:  [63-68] 63 (05/27 0640) Resp:  [18] 18 (05/27 0518) BP: (118-138)/(31-46) 138/46 (05/27 0640) SpO2:  [97 %-99 %] 99 % (05/27 0518) Last BM Date: 12/20/17  Intake/Output from previous day: 05/26 0701 - 05/27 0700 In: 3564.2 [P.O.:960; I.V.:2304.2; IV Piggyback:300] Out: 700 [Urine:700] Intake/Output this shift: No intake/output data recorded.  General appearance: alert, cooperative and no distress Extremities: Right foot with no drainage from wound.  Erythema much improved.  Still with some soft tissue discoloration present on flap.  This is improved since admission.  Lab Results:  Recent Labs    12/21/17 1159 12/22/17 0626  WBC 18.7* 13.0*  HGB 10.6* 9.3*  HCT 32.8* 29.6*  PLT 248 244   BMET Recent Labs    12/22/17 0626 12/23/17 0606  NA 132* 131*  K 4.8 4.7  CL 103 105  CO2 22 17*  GLUCOSE 102* 102*  BUN 57* 60*  CREATININE 3.08* 3.09*  CALCIUM 8.2* 7.8*   PT/INR No results for input(s): LABPROT, INR in the last 72 hours.  Studies/Results: US Renal  Result Date: 12/22/2017 CLINICAL DATA:  Chronic renal disease, stage III. EXAM: RENAL / URINARY TRACT ULTRASOUND COMPLETE COMPARISON:  02/27/2017 FINDINGS: Right Kidney: Length: 11.7 cm. Echogenicity within normal limits. No mass or hydronephrosis visualized. Left Kidney: Length: 10.7 cm. Echogenicity within normal limits. No mass or hydronephrosis visualized. Linear nonobstructive calculus measuring 12 mm in the lower pole of the left kidney. Bladder: Nondistended and therefore not well evaluated. IMPRESSION: Study limited by body habitus. Left nonobstructive nephrolithiasis. Normal cortical thickness the kidneys. Nondistended and therefore not well evaluated urinary bladder. Electronically Signed   By: Fidela Salisbury M.D.   On: 12/22/2017 10:45   US  Arterial Abi (screening Lower Extremity)  Result Date: 12/22/2017 CLINICAL DATA:  Previous right transmetatarsal amputation 2010. Plantar ulcer times weeks. Diabetes, hypertension, hyperlipidemia, obesity. EXAM: NONINVASIVE PHYSIOLOGIC VASCULAR STUDY OF BILATERAL LOWER EXTREMITIES TECHNIQUE: Evaluation of both lower extremities were performed at rest, including calculation of ankle-brachial indices with single level Doppler, pressure and pulse volume recording. COMPARISON:  05/03/2009 FINDINGS: Right ABI:  1.24 Left ABI: 1.53 Right Lower Extremity:  Biphasic distal arterial waveforms. Left Lower Extremity:  Biphasic distal arterial waveforms. IMPRESSION: Elevated ABIs suggesting non diagnostic secondary to incompressible vessel calcifications (medial arterial sclerosis of Monckeberg). Should the patient fail conservative treatment, consider MRA runoff (preferred over the CTA in the setting of vascular calcifications; no radiation risk, can be performed noncontrast in the setting of renal dysfunction) to better define the site and nature of arterial occlusive disease and delineate treatment options. Electronically Signed   By: Lucrezia Europe M.D.   On: 12/22/2017 10:50   Dg Foot Complete Right  Result Date: 12/21/2017 CLINICAL DATA:  Right foot pain and redness of the amputation site. EXAM: RIGHT FOOT COMPLETE - 3+ VIEW COMPARISON:  04/27/2009 FINDINGS: There has been interval amputation of the right foot at the level of the tarsometatarsal joints. There is soft tissue thickening overlying the amputation site. Deep ulcer is seen within the soft tissues in the medial aspect of the amputation site. With small amount of soft tissue emphysema. No osteolytic changes of the underlying osseous structures. IMPRESSION: Status post amputation at the level of the tarsometatarsal joints. Significant soft tissue swelling at the amputation site with a deep ulcer medially, and small amount of  soft tissue emphysema. Findings are  suspicious for cellulitis/fasciitis, possibly necrotizing. No radiographic evidence of osteomyelitis. Electronically Signed   By: Fidela Salisbury M.D.   On: 12/21/2017 13:06    Anti-infectives: Anti-infectives (From admission, onward)   Start     Dose/Rate Route Frequency Ordered Stop   12/22/17 1900  aztreonam (AZACTAM) 0.5 g in dextrose 5 % 50 mL IVPB  Status:  Discontinued     0.5 g 100 mL/hr over 30 Minutes Intravenous Every 8 hours 12/22/17 1338 12/22/17 1346   12/22/17 1900  aztreonam (AZACTAM) 1 g in sodium chloride 0.9 % 100 mL IVPB     1 g 200 mL/hr over 30 Minutes Intravenous Every 8 hours 12/22/17 1346     12/21/17 1700  clindamycin (CLEOCIN) IVPB 900 mg     900 mg 100 mL/hr over 30 Minutes Intravenous Every 8 hours 12/21/17 1637     12/21/17 1700  aztreonam (AZACTAM) 1 g in sodium chloride 0.9 % 100 mL IVPB  Status:  Discontinued     1 g 200 mL/hr over 30 Minutes Intravenous Every 8 hours 12/21/17 1606 12/22/17 1339   12/21/17 1200  vancomycin (VANCOCIN) IVPB 1000 mg/200 mL premix     1,000 mg 200 mL/hr over 60 Minutes Intravenous  Once 12/21/17 1155 12/21/17 1330      Assessment/Plan: Impression: Cellulitis of right foot, diabetes mellitus, peripheral vascular disease.  Patient did have segmental Dopplers yesterday, results are pending.  To have MRI of foot.  Further management is pending those results.  LOS: 2 days    Aviva Signs 12/23/2017

## 2017-12-23 NOTE — Care Management Important Message (Signed)
Important Message  Patient Details  Name: Courtney Schneider MRN: [id number] Date of Birth: Apr 29, 1946   Medicare Important Message Given:  Yes    Shelda Altes 12/23/2017, 11:18 AM

## 2017-12-23 NOTE — Progress Notes (Signed)
PROGRESS NOTE                                                                                                                                                                                                             Patient Demographics:    Courtney Schneider, is a 72 y.o. female, DOB - July 23, 1946, ZOX:[id number]  Admit date - 12/21/2017   Admitting Physician Kathie Dike, MD  Outpatient Primary MD for the patient is Celene Squibb, MD  LOS - 2  Outpatient Specialists: Dr. Theador Hawthorne (nephrology)  Chief Complaint  Patient presents with  . Foot Problem       Brief Narrative 72 year old female with uncontrolled diabetes mellitus, insulin-dependent, right foot gangrene status post transmetatarsal amputation in 2010, chronic kidney disease stage III (baseline creatinine not known but follows with nephrologist) presented with right foot plantar wound for several weeks.  She was seeing her PCP and undergoing dressing changes with some debridement in the office.  However for the past several days she has noticed some foul-smelling drainage coming from the wound with worsening erythema and swelling of the foot.  Also reported subjective fevers with chills at home. In the ED she had significant WBC of 18.7 and creatinine of 2.8. X-ray of the foot was negative for osteomyelitis but did show some gas bubbles. Patient admitted to hospitalist service for further management and surgery consulted.   Subjective:   Reports having difficulty falling asleep last night.  Denies any pain.  Assessment  & Plan :    Active Problems:   Cellulitis/infected diabetic foot ulcer On empiric clindamycin and aztreonam.  Cultures so far without any growth.  MRI of the foot to rule out osteomyelitis.  Arterial ABI nondiagnostic due to vessel calcification.  Possibly needs MRA runoff if patient failed conservative treatment. Pain control with PRN  Vicodin. Surgery consult appreciated and recommends nonsurgical management for now.  If failed conservative management patient may need orthopedic evaluation for amputation.   Acute knee injury on chronic kidney disease stage III (HCC) Creatinine of 1.87 about 2 years back.  Follows with nephrologist Dr. Theador Hawthorne and has been informed that her renal function is stable.  Renal ultrasound shows nonobstructive left nephrolithiasis.  Urine lites suggestive of ATN. Avoid nephrotoxins.  Continue IV hydration and hold Lasix. Nephrology consult.   Hyperlipidemia Continue statin  Insulin dependent type 2 diabetes mellitus with hyperglycemia (HCC) Continue home dose Lantus with sliding scale coverage.  A1c of 6.3.  Anemia of chronic kidney disease Monitor H&H.    Code Status : Full code  Family Communication  : Daughter at bedside  Disposition Plan  : Home pending clinical improvement and work-up  Barriers For Discharge : Active symptoms  Consults  : Surgery  Procedures  : ABI of right foot, pending MRI of the foot  DVT Prophylaxis  : Heparin  Lab Results  Component Value Date   PLT 244 12/22/2017    Antibiotics  :    Anti-infectives (From admission, onward)   Start     Dose/Rate Route Frequency Ordered Stop   12/22/17 1900  aztreonam (AZACTAM) 0.5 g in dextrose 5 % 50 mL IVPB  Status:  Discontinued     0.5 g 100 mL/hr over 30 Minutes Intravenous Every 8 hours 12/22/17 1338 12/22/17 1346   12/22/17 1900  aztreonam (AZACTAM) 1 g in sodium chloride 0.9 % 100 mL IVPB     1 g 200 mL/hr over 30 Minutes Intravenous Every 8 hours 12/22/17 1346     12/21/17 1700  clindamycin (CLEOCIN) IVPB 900 mg     900 mg 100 mL/hr over 30 Minutes Intravenous Every 8 hours 12/21/17 1637     12/21/17 1700  aztreonam (AZACTAM) 1 g in sodium chloride 0.9 % 100 mL IVPB  Status:  Discontinued     1 g 200 mL/hr over 30 Minutes Intravenous Every 8 hours 12/21/17 1606 12/22/17 1339   12/21/17 1200   vancomycin (VANCOCIN) IVPB 1000 mg/200 mL premix     1,000 mg 200 mL/hr over 60 Minutes Intravenous  Once 12/21/17 1155 12/21/17 1330        Objective:   Vitals:   12/22/17 1500 12/22/17 2117 12/23/17 0518 12/23/17 0640  BP: (!) 124/44 (!) 118/39 (!) 120/31 (!) 138/46  Pulse: 68 63 63 63  Resp: 18  18   Temp:  98.2 F (36.8 C) 98.2 F (36.8 C)   TempSrc:  Oral Oral   SpO2: 98% 97% 99%   Weight:      Height:        Wt Readings from Last 3 Encounters:  12/21/17 115.3 kg (254 lb 3.1 oz)  09/10/15 106.6 kg (235 lb)     Intake/Output Summary (Last 24 hours) at 12/23/2017 1114 Last data filed at 12/23/2017 0630 Gross per 24 hour  Intake 3324.16 ml  Output 700 ml  Net 2624.16 ml    Physical exam Elderly female not in distress HEENT: Moist mucosa, supple neck Chest: Clear bilaterally CVs: Normal S1 and S2, no murmurs GI: Soft, nondistended, nontender Musculoskeletal: Right foot ulcer measuring 1 cm on the plantar surface with no discharge, no tenderness, absent sensation and distal pulsation.     Data Review:    CBC Recent Labs  Lab 12/21/17 1159 12/22/17 0626  WBC 18.7* 13.0*  HGB 10.6* 9.3*  HCT 32.8* 29.6*  PLT 248 244  MCV 92.9 94.0  MCH 30.0 29.5  MCHC 32.3 31.4  RDW 13.2 13.5  LYMPHSABS 1.6  --   MONOABS 2.2*  --   EOSABS 0.0  --   BASOSABS 0.0  --     Chemistries  Recent Labs  Lab 12/21/17 1159 12/22/17 0626 12/23/17 0606  NA 133* 132* 131*  K 4.7 4.8 4.7  CL 100* 103 105  CO2 22 22 17*  GLUCOSE 151* 102* 102*  BUN 54* 57* 60*  CREATININE 2.80* 3.08* 3.09*  CALCIUM 8.9 8.2* 7.8*  AST  --  32  --   ALT  --  14  --   ALKPHOS  --  56  --   BILITOT  --  0.8  --    ------------------------------------------------------------------------------------------------------------------ No results for input(s): CHOL, HDL, LDLCALC, TRIG, CHOLHDL, LDLDIRECT in the last 72 hours.  Lab Results  Component Value Date   HGBA1C 6.3 (H) 12/21/2017    ------------------------------------------------------------------------------------------------------------------ No results for input(s): TSH, T4TOTAL, T3FREE, THYROIDAB in the last 72 hours.  Invalid input(s): FREET3 ------------------------------------------------------------------------------------------------------------------ No results for input(s): VITAMINB12, FOLATE, FERRITIN, TIBC, IRON, RETICCTPCT in the last 72 hours.  Coagulation profile No results for input(s): INR, PROTIME in the last 168 hours.  No results for input(s): DDIMER in the last 72 hours.  Cardiac Enzymes No results for input(s): CKMB, TROPONINI, MYOGLOBIN in the last 168 hours.  Invalid input(s): CK ------------------------------------------------------------------------------------------------------------------ No results found for: BNP  Inpatient Medications  Scheduled Meds: . aspirin EC  81 mg Oral Daily  . enoxaparin (LOVENOX) injection  30 mg Subcutaneous Q24H  . insulin aspart  0-15 Units Subcutaneous TID WC  . insulin aspart  0-5 Units Subcutaneous QHS  . insulin glargine  26 Units Subcutaneous QHS  . pravastatin  40 mg Oral q1800   Continuous Infusions: . sodium chloride 125 mL/hr at 12/23/17 1110  . aztreonam 1 g (12/23/17 1110)  . clindamycin (CLEOCIN) IV Stopped (12/23/17 1007)   PRN Meds:.acetaminophen **OR** acetaminophen, HYDROcodone-acetaminophen, ondansetron **OR** ondansetron (ZOFRAN) IV  Micro Results Recent Results (from the past 240 hour(s))  Blood Cultures x 2 sites     Status: None (Preliminary result)   Collection Time: 12/21/17  5:13 PM  Result Value Ref Range Status   Specimen Description LEFT ANTECUBITAL  Final   Special Requests   Final    BOTTLES DRAWN AEROBIC AND ANAEROBIC Blood Culture adequate volume   Culture   Final    NO GROWTH 2 DAYS Performed at [facility], [address], [place], [place] [address]    Report Status PENDING  Incomplete  Blood  Cultures x 2 sites     Status: None (Preliminary result)   Collection Time: 12/21/17  5:20 PM  Result Value Ref Range Status   Specimen Description BLOOD RIGHT ARM  Final   Special Requests   Final    BOTTLES DRAWN AEROBIC AND ANAEROBIC Blood Culture adequate volume   Culture   Final    NO GROWTH 2 DAYS Performed at [facility], [address] [place], [place] [address]    Report Status PENDING  Incomplete  Aerobic Culture (superficial specimen)     Status: None (Preliminary result)   Collection Time: 12/22/17  6:53 AM  Result Value Ref Range Status   Specimen Description   Final    FOOT RIGHT FOOT Performed at [facility], [address], [place], [place] [address]    Special Requests   Final    NONE Performed at [facility], [address], [place], [place] [address]    Gram Stain   Final    RARE WBC PRESENT, PREDOMINANTLY PMN ABUNDANT GRAM POSITIVE COCCI ABUNDANT GRAM NEGATIVE RODS FEW GRAM POSITIVE RODS Performed at [facility], [place] [address], [place], [place] [address]    Culture PENDING  Incomplete   Report Status PENDING  Incomplete    Radiology Reports US Renal  Result Date: 12/22/2017 CLINICAL DATA:  Chronic renal disease, stage  III. EXAM: RENAL / URINARY TRACT ULTRASOUND COMPLETE COMPARISON:  02/27/2017 FINDINGS: Right Kidney: Length: 11.7 cm. Echogenicity within normal limits. No mass or hydronephrosis visualized. Left Kidney: Length: 10.7 cm. Echogenicity within normal limits. No mass or hydronephrosis visualized. Linear nonobstructive calculus measuring 12 mm in the lower pole of the left kidney. Bladder: Nondistended and therefore not well evaluated. IMPRESSION: Study limited by body habitus. Left nonobstructive nephrolithiasis. Normal cortical thickness the kidneys. Nondistended and therefore not well evaluated urinary bladder. Electronically Signed   By: Fidela Salisbury M.D.   On: 12/22/2017 10:45   US Arterial Abi (screening Lower  Extremity)  Result Date: 12/22/2017 CLINICAL DATA:  Previous right transmetatarsal amputation 2010. Plantar ulcer times weeks. Diabetes, hypertension, hyperlipidemia, obesity. EXAM: NONINVASIVE PHYSIOLOGIC VASCULAR STUDY OF BILATERAL LOWER EXTREMITIES TECHNIQUE: Evaluation of both lower extremities were performed at rest, including calculation of ankle-brachial indices with single level Doppler, pressure and pulse volume recording. COMPARISON:  05/03/2009 FINDINGS: Right ABI:  1.24 Left ABI: 1.53 Right Lower Extremity:  Biphasic distal arterial waveforms. Left Lower Extremity:  Biphasic distal arterial waveforms. IMPRESSION: Elevated ABIs suggesting non diagnostic secondary to incompressible vessel calcifications (medial arterial sclerosis of Monckeberg). Should the patient fail conservative treatment, consider MRA runoff (preferred over the CTA in the setting of vascular calcifications; no radiation risk, can be performed noncontrast in the setting of renal dysfunction) to better define the site and nature of arterial occlusive disease and delineate treatment options. Electronically Signed   By: Lucrezia Europe M.D.   On: 12/22/2017 10:50   Dg Foot Complete Right  Result Date: 12/21/2017 CLINICAL DATA:  Right foot pain and redness of the amputation site. EXAM: RIGHT FOOT COMPLETE - 3+ VIEW COMPARISON:  04/27/2009 FINDINGS: There has been interval amputation of the right foot at the level of the tarsometatarsal joints. There is soft tissue thickening overlying the amputation site. Deep ulcer is seen within the soft tissues in the medial aspect of the amputation site. With small amount of soft tissue emphysema. No osteolytic changes of the underlying osseous structures. IMPRESSION: Status post amputation at the level of the tarsometatarsal joints. Significant soft tissue swelling at the amputation site with a deep ulcer medially, and small amount of soft tissue emphysema. Findings are suspicious for  cellulitis/fasciitis, possibly necrotizing. No radiographic evidence of osteomyelitis. Electronically Signed   By: Fidela Salisbury M.D.   On: 12/21/2017 13:06    Time Spent in minutes  25   Magdalyn Arenivas M.D on 12/23/2017 at 11:14 AM  Between 7am to 7pm - Pager - [phone]  After 7pm go to [email or web address] - password [facility], [facility], [facility]  [facility] -  Office  [phone]

## 2017-12-24 ENCOUNTER — Inpatient Hospital Stay (HOSPITAL_COMMUNITY): Payer: Medicare Other

## 2017-12-24 DIAGNOSIS — N183 Chronic kidney disease, stage 3 unspecified: Secondary | ICD-10-CM | POA: Diagnosis present

## 2017-12-24 DIAGNOSIS — L03115 Cellulitis of right lower limb: Secondary | ICD-10-CM

## 2017-12-24 DIAGNOSIS — N179 Acute kidney failure, unspecified: Secondary | ICD-10-CM | POA: Diagnosis present

## 2017-12-24 DIAGNOSIS — B9562 Methicillin resistant Staphylococcus aureus infection as the cause of diseases classified elsewhere: Secondary | ICD-10-CM

## 2017-12-24 DIAGNOSIS — N170 Acute kidney failure with tubular necrosis: Secondary | ICD-10-CM | POA: Diagnosis present

## 2017-12-24 LAB — GLUCOSE, CAPILLARY
GLUCOSE-CAPILLARY: 147 mg/dL — AB (ref 65–99)
GLUCOSE-CAPILLARY: 121 mg/dL — AB (ref 65–99)
Glucose-Capillary: 90 mg/dL (ref 65–99)
Glucose-Capillary: 119 mg/dL — ABNORMAL HIGH (ref 65–99)

## 2017-12-24 LAB — BASIC METABOLIC PANEL
Anion gap: 6 (ref 5–15)
BUN: 53 mg/dL — AB (ref 6–20)
CALCIUM: 8.2 mg/dL — AB (ref 8.9–10.3)
CO2: 20 mmol/L — ABNORMAL LOW (ref 22–32)
Chloride: 108 mmol/L (ref 101–111)
Creatinine, Ser: 2.64 mg/dL — ABNORMAL HIGH (ref 0.44–1.00)
GFR calc Af Amer: 20 mL/min — ABNORMAL LOW (ref >60)
GFR, EST NON AFRICAN AMERICAN: 17 mL/min — AB (ref >60)
Glucose, Bld: 118 mg/dL — ABNORMAL HIGH (ref 65–99)
POTASSIUM: 5.4 mmol/L — AB (ref 3.5–5.1)
Sodium: 134 mmol/L — ABNORMAL LOW (ref 135–145)

## 2017-12-24 LAB — AEROBIC CULTURE  (SUPERFICIAL SPECIMEN)

## 2017-12-24 LAB — AEROBIC CULTURE W GRAM STAIN (SUPERFICIAL SPECIMEN)

## 2017-12-24 LAB — RENAL FUNCTION PANEL
Albumin: 2.4 g/dL — ABNORMAL LOW (ref 3.5–5.0)
Anion gap: 7 (ref 5–15)
BUN: 53 mg/dL — AB (ref 6–20)
CALCIUM: 8.2 mg/dL — AB (ref 8.9–10.3)
CO2: 19 mmol/L — AB (ref 22–32)
CREATININE: 2.55 mg/dL — AB (ref 0.44–1.00)
Chloride: 108 mmol/L (ref 101–111)
GFR calc non Af Amer: 18 mL/min — ABNORMAL LOW (ref >60)
GFR, EST AFRICAN AMERICAN: 21 mL/min — AB (ref >60)
Glucose, Bld: 117 mg/dL — ABNORMAL HIGH (ref 65–99)
Phosphorus: 4.6 mg/dL (ref 2.5–4.6)
Potassium: 5.3 mmol/L — ABNORMAL HIGH (ref 3.5–5.1)
Sodium: 134 mmol/L — ABNORMAL LOW (ref 135–145)

## 2017-12-24 MED ORDER — CIPROFLOXACIN IN D5W 400 MG/200ML IV SOLN
400.0000 mg | INTRAVENOUS | Status: DC
  Administered 2017-12-24: 400 mg via INTRAVENOUS
  Filled 2017-12-24: qty 200

## 2017-12-24 MED ORDER — FUROSEMIDE 10 MG/ML IJ SOLN
40.0000 mg | Freq: Two times a day (BID) | INTRAMUSCULAR | Status: DC
  Administered 2017-12-24 – 2017-12-25 (×3): 40 mg via INTRAVENOUS
  Filled 2017-12-24 (×3): qty 4

## 2017-12-24 NOTE — Progress Notes (Signed)
Subjective: Interval History: has complaints neck and back pain.  Presently she is feeling better.  She had also some headache last night.  Presently she denies any difficulty breathing..  Objective: Vital signs in last 24 hours: Temp:  [98.1 F (36.7 C)-99.5 F (37.5 C)] 99.5 F (37.5 C) (05/28 0313) Pulse Rate:  [68-81] 81 (05/28 0313) Resp:  [18] 18 (05/28 0313) BP: (132-137)/(53-90) 132/53 (05/28 0313) SpO2:  [91 %-98 %] 91 % (05/28 0313) Weight change:   Intake/Output from previous day: 05/27 0701 - 05/28 0700 In: 3204.2 [I.V.:2754.2; IV Piggyback:450] Out: 600 [Urine:600] Intake/Output this shift: No intake/output data recorded.  General appearance: alert, cooperative and no distress Resp: clear to auscultation bilaterally Cardio: regular rate and rhythm Extremities: No edema  Lab Results: Recent Labs    12/21/17 1159 12/22/17 0626  WBC 18.7* 13.0*  HGB 10.6* 9.3*  HCT 32.8* 29.6*  PLT 248 244   BMET:  Recent Labs    12/23/17 0606 12/24/17 0509  NA 131* 134*  134*  K 4.7 5.3*  5.4*  CL 105 108  108  CO2 17* 19*  20*  GLUCOSE 102* 117*  118*  BUN 60* 53*  53*  CREATININE 3.09* 2.55*  2.64*  CALCIUM 7.8* 8.2*  8.2*   No results for input(s): PTH in the last 72 hours. Iron Studies: No results for input(s): IRON, TIBC, TRANSFERRIN, FERRITIN in the last 72 hours.  Studies/Results: US Renal  Result Date: 12/22/2017 CLINICAL DATA:  Chronic renal disease, stage III. EXAM: RENAL / URINARY TRACT ULTRASOUND COMPLETE COMPARISON:  02/27/2017 FINDINGS: Right Kidney: Length: 11.7 cm. Echogenicity within normal limits. No mass or hydronephrosis visualized. Left Kidney: Length: 10.7 cm. Echogenicity within normal limits. No mass or hydronephrosis visualized. Linear nonobstructive calculus measuring 12 mm in the lower pole of the left kidney. Bladder: Nondistended and therefore not well evaluated. IMPRESSION: Study limited by body habitus. Left nonobstructive  nephrolithiasis. Normal cortical thickness the kidneys. Nondistended and therefore not well evaluated urinary bladder. Electronically Signed   By: Fidela Salisbury M.D.   On: 12/22/2017 10:45   US Arterial Abi (screening Lower Extremity)  Result Date: 12/22/2017 CLINICAL DATA:  Previous right transmetatarsal amputation 2010. Plantar ulcer times weeks. Diabetes, hypertension, hyperlipidemia, obesity. EXAM: NONINVASIVE PHYSIOLOGIC VASCULAR STUDY OF BILATERAL LOWER EXTREMITIES TECHNIQUE: Evaluation of both lower extremities were performed at rest, including calculation of ankle-brachial indices with single level Doppler, pressure and pulse volume recording. COMPARISON:  05/03/2009 FINDINGS: Right ABI:  1.24 Left ABI: 1.53 Right Lower Extremity:  Biphasic distal arterial waveforms. Left Lower Extremity:  Biphasic distal arterial waveforms. IMPRESSION: Elevated ABIs suggesting non diagnostic secondary to incompressible vessel calcifications (medial arterial sclerosis of Monckeberg). Should the patient fail conservative treatment, consider MRA runoff (preferred over the CTA in the setting of vascular calcifications; no radiation risk, can be performed noncontrast in the setting of renal dysfunction) to better define the site and nature of arterial occlusive disease and delineate treatment options. Electronically Signed   By: Lucrezia Europe M.D.   On: 12/22/2017 10:50    I have reviewed the patient's current medications.  Assessment/Plan: 1] acute kidney injury superimposed on chronic.  Presently her renal function is progressively improving.  Patient has this moment does not have any uremic signs and symptoms. 2] cellulitis of her foot: Presently she is on antibiotics.  She is a febrile.  MRI of the foot is pending. 3] bone and mineral disorder: Her calcium and phosphorus is in range 4] hypertension: Her  blood pressure is reasonably controlled 5] diabetes 6] hyperkalemia: Her potassium is high normal.   Possibly from her diet  7] anemia: Her hemoglobin is below our target goal and also has declined. Plan: 1] we will put patient on low potassium diet 2] we will start her on Lasix 40 mg IV twice daily 3] we will decrease IV fluid to 100 cc/h 4] we will check iron studies and renal panel in the morning.   LOS: 3 days   Zoiee Wimmer S 12/24/2017,9:12 AM

## 2017-12-24 NOTE — Care Management (Signed)
CM following. MRI negative for oseo, pt will DC on PO abx. Home IV abx will not be needed.

## 2017-12-24 NOTE — Progress Notes (Signed)
Pharmacy Antibiotic Note  Courtney Schneider is a 72 y.o. female admitted on 12/21/2017 with Wound Infection.  Pharmacy has been consulted for Aztreonam dosing.  Plan: Continue Aztreonam 1gm IV every 8 hours. Monitor labs, micro and vitals.   Height: 5\' 4"  (162.6 cm) Weight: 254 lb 3.1 oz (115.3 kg) IBW/kg (Calculated) : 54.7  Temp (24hrs), Avg:98.6 F (37 C), Min:98.1 F (36.7 C), Max:99.5 F (37.5 C)  Recent Labs  Lab 12/21/17 1159 12/22/17 0626 12/23/17 0606 12/24/17 0509  WBC 18.7* 13.0*  --   --   CREATININE 2.80* 3.08* 3.09* 2.55*  2.64*    Estimated Creatinine Clearance: 25.1 mL/min (A) (by C-G formula based on SCr of 2.64 mg/dL (H)).    No Known Allergies  Antimicrobials this admission: Aztreonam 5/25 >>  Clinda 5/25 >>   Dose adjustments this admission: N/a  Microbiology results: 5/25/BCx2: pending 5/26 Wound BE:EFEO - Sensitive to Clinda & Morganella (aztreonam should cover)  Thank you for allowing pharmacy to be a part of this patient's care.  Pricilla Larsson 12/24/2017 11:20 AM

## 2017-12-24 NOTE — Progress Notes (Addendum)
PROGRESS NOTE                                                                                                                                                                                                             Patient Demographics:    Courtney Schneider, is a 72 y.o. female, DOB - 1946-03-18, OBS:[id number]  Admit date - 12/21/2017   Admitting Physician Kathie Dike, MD  Outpatient Primary MD for the patient is Celene Squibb, MD  LOS - 3  Outpatient Specialists: Dr. Theador Hawthorne (nephrology)  Chief Complaint  Patient presents with  . Foot Problem       Brief Narrative 72 year old female with uncontrolled diabetes mellitus, insulin-dependent, right foot gangrene status post transmetatarsal amputation in 2010, chronic kidney disease stage III (baseline creatinine not known but follows with nephrologist) presented with right foot plantar wound for several weeks.  She was seeing her PCP and undergoing dressing changes with some debridement in the office.  However for the past several days she has noticed some foul-smelling drainage coming from the wound with worsening erythema and swelling of the foot.  Also reported subjective fevers with chills at home. In the ED she had significant WBC of 18.7 and creatinine of 2.8. X-ray of the foot was negative for osteomyelitis but did show some gas bubbles. Patient admitted to hospitalist service for further management and surgery consulted.   Subjective:    reports having some pain in the back of the neck due to improper positioning.  Assessment  & Plan :    Active Problems:   Cellulitis/infected diabetic foot ulcer with abscess. On empiric clindamycin and aztreonam.  Wound culture growing abundant MRSA and Morganella.  MRSA is sensitive to gentamicin but resistant to Cipro however Morganella is sensitive to Cipro and is not covered by clindamycin.  So I will do a combination of  clindamycin and Cipro to cover both.  Bactrim would not be suitable given her worsened renal function.  Discontinue aztreonam.    MRI of the foot negative for osteomyelitis but shows cellulitis and possible 21mm abscess on the lateral aspect of the foot.  Also has myofascitis. Arterial ABI nondiagnostic due to vessel calcification.  Possibly needs MRA runoff if patient failed conservative treatment. Pain control with PRN Vicodin.  Discussed with surgery.  Abscess too small to be drained so  recommends 2 weeks of oral antibiotic follow-up with surgery or PCP.     Acute knee injury on chronic kidney disease stage III (HCC) Creatinine of 1.87 about 2 years back.  Follows with nephrologist Dr. Theador Hawthorne.  By nephrology and suggest her renal function has worsened likely prerenal.  Now improving with IV fluids. Lasix on hold.  Renal ultrasound shows nonobstructive left nephrolithiasis.  Urine lites suggestive of ATN.   Hyperlipidemia Continue statin    Insulin dependent type 2 diabetes mellitus with hyperglycemia (HCC) Continue home dose Lantus with sliding scale coverage.  A1c of 6.3.  Anemia of chronic kidney disease Monitor H&H.  Hyperkalemia Monitor for now.    Code Status : Full code  Family Communication  : Daughter at bedside  Disposition Plan  : Home possibly tomorrow on oral clindamycin for total 2 weeks if renal function continues to improve.  Barriers For Discharge : Active symptoms  Consults  : Surgery  Procedures  : ABI of right foot, MRI of the foot  DVT Prophylaxis  : Heparin  Lab Results  Component Value Date   PLT 244 12/22/2017    Antibiotics  :    Anti-infectives (From admission, onward)   Start     Dose/Rate Route Frequency Ordered Stop   12/22/17 1900  aztreonam (AZACTAM) 0.5 g in dextrose 5 % 50 mL IVPB  Status:  Discontinued     0.5 g 100 mL/hr over 30 Minutes Intravenous Every 8 hours 12/22/17 1338 12/22/17 1346   12/22/17 1900  aztreonam (AZACTAM)  1 g in sodium chloride 0.9 % 100 mL IVPB     1 g 200 mL/hr over 30 Minutes Intravenous Every 8 hours 12/22/17 1346     12/21/17 1700  clindamycin (CLEOCIN) IVPB 900 mg     900 mg 100 mL/hr over 30 Minutes Intravenous Every 8 hours 12/21/17 1637     12/21/17 1700  aztreonam (AZACTAM) 1 g in sodium chloride 0.9 % 100 mL IVPB  Status:  Discontinued     1 g 200 mL/hr over 30 Minutes Intravenous Every 8 hours 12/21/17 1606 12/22/17 1339   12/21/17 1200  vancomycin (VANCOCIN) IVPB 1000 mg/200 mL premix     1,000 mg 200 mL/hr over 60 Minutes Intravenous  Once 12/21/17 1155 12/21/17 1330        Objective:   Vitals:   12/23/17 2026 12/24/17 0313 12/24/17 0940 12/24/17 1335  BP: 137/90 (!) 132/53 (!) 135/45 (!) 101/57  Pulse: 68 81 76 88  Resp: 18 18 20 20   Temp: 98.1 F (36.7 C) 99.5 F (37.5 C) 98.3 F (36.8 C) 98.6 F (37 C)  TempSrc: Oral Oral Oral Oral  SpO2: 98% 91% 98% 97%  Weight:      Height:        Wt Readings from Last 3 Encounters:  12/21/17 115.3 kg (254 lb 3.1 oz)  09/10/15 106.6 kg (235 lb)     Intake/Output Summary (Last 24 hours) at 12/24/2017 1439 Last data filed at 12/24/2017 1150 Gross per 24 hour  Intake 3254.17 ml  Output 600 ml  Net 2654.17 ml   Physical exam Not in distress Musculoskeletal: Warm, no edema Chest: Clear bilaterally CVS: Normal S1-S2 GI: Soft, nondistended, nontender Musculoskeletal: Right foot ulcer measuring 1 cm on the plantar surface with no discharge, nontender.    Data Review:    CBC Recent Labs  Lab 12/21/17 1159 12/22/17 0626  WBC 18.7* 13.0*  HGB 10.6* 9.3*  HCT 32.8* 29.6*  PLT 248 244  MCV 92.9 94.0  MCH 30.0 29.5  MCHC 32.3 31.4  RDW 13.2 13.5  LYMPHSABS 1.6  --   MONOABS 2.2*  --   EOSABS 0.0  --   BASOSABS 0.0  --     Chemistries  Recent Labs  Lab 12/21/17 1159 12/22/17 0626 12/23/17 0606 12/24/17 0509  NA 133* 132* 131* 134*  134*  K 4.7 4.8 4.7 5.3*  5.4*  CL 100* 103 105 108  108  CO2  22 22 17* 19*  20*  GLUCOSE 151* 102* 102* 117*  118*  BUN 54* 57* 60* 53*  53*  CREATININE 2.80* 3.08* 3.09* 2.55*  2.64*  CALCIUM 8.9 8.2* 7.8* 8.2*  8.2*  AST  --  32  --   --   ALT  --  14  --   --   ALKPHOS  --  56  --   --   BILITOT  --  0.8  --   --    ------------------------------------------------------------------------------------------------------------------ No results for input(s): CHOL, HDL, LDLCALC, TRIG, CHOLHDL, LDLDIRECT in the last 72 hours.  Lab Results  Component Value Date   HGBA1C 6.3 (H) 12/21/2017   ------------------------------------------------------------------------------------------------------------------ No results for input(s): TSH, T4TOTAL, T3FREE, THYROIDAB in the last 72 hours.  Invalid input(s): FREET3 ------------------------------------------------------------------------------------------------------------------ No results for input(s): VITAMINB12, FOLATE, FERRITIN, TIBC, IRON, RETICCTPCT in the last 72 hours.  Coagulation profile No results for input(s): INR, PROTIME in the last 168 hours.  No results for input(s): DDIMER in the last 72 hours.  Cardiac Enzymes No results for input(s): CKMB, TROPONINI, MYOGLOBIN in the last 168 hours.  Invalid input(s): CK ------------------------------------------------------------------------------------------------------------------ No results found for: BNP  Inpatient Medications  Scheduled Meds: . aspirin EC  81 mg Oral Daily  . enoxaparin (LOVENOX) injection  30 mg Subcutaneous Q24H  . feeding supplement (GLUCERNA SHAKE)  237 mL Oral BID BM  . furosemide  40 mg Intravenous BID  . insulin aspart  0-15 Units Subcutaneous TID WC  . insulin aspart  0-5 Units Subcutaneous QHS  . insulin glargine  26 Units Subcutaneous QHS  . multivitamin with minerals  1 tablet Oral Daily  . pravastatin  40 mg Oral q1800   Continuous Infusions: . sodium chloride 100 mL/hr at 12/24/17 0942  . aztreonam  1 g (12/24/17 1359)  . clindamycin (CLEOCIN) IV Stopped (12/24/17 1150)   PRN Meds:.acetaminophen **OR** acetaminophen, HYDROcodone-acetaminophen, ondansetron **OR** ondansetron (ZOFRAN) IV, zolpidem  Micro Results Recent Results (from the past 240 hour(s))  Blood Cultures x 2 sites     Status: None (Preliminary result)   Collection Time: 12/21/17  5:13 PM  Result Value Ref Range Status   Specimen Description LEFT ANTECUBITAL  Final   Special Requests   Final    BOTTLES DRAWN AEROBIC AND ANAEROBIC Blood Culture adequate volume   Culture   Final    NO GROWTH 3 DAYS Performed at [facility], [address] [place], [place] [address]    Report Status PENDING  Incomplete  Blood Cultures x 2 sites     Status: None (Preliminary result)   Collection Time: 12/21/17  5:20 PM  Result Value Ref Range Status   Specimen Description BLOOD RIGHT ARM  Final   Special Requests   Final    BOTTLES DRAWN AEROBIC AND ANAEROBIC Blood Culture adequate volume   Culture   Final    NO GROWTH 3 DAYS Performed at [facility], [address], [place], [place] [address]  Report Status PENDING  Incomplete  Aerobic Culture (superficial specimen)     Status: None   Collection Time: 12/22/17  6:53 AM  Result Value Ref Range Status   Specimen Description   Final    FOOT RIGHT FOOT Performed at [facility], [address] [place], [place] [address]    Special Requests   Final    NONE Performed at [facility], [address] [place], [place] [address]    Gram Stain   Final    RARE WBC PRESENT, PREDOMINANTLY PMN ABUNDANT GRAM POSITIVE COCCI ABUNDANT GRAM NEGATIVE RODS FEW GRAM POSITIVE RODS Performed at [facility], [place] [address], [place], [place] [address]    Culture   Final    ABUNDANT METHICILLIN RESISTANT STAPHYLOCOCCUS AUREUS MODERATE MORGANELLA MORGANII    Report Status 12/24/2017 FINAL  Final   Organism ID, Bacteria METHICILLIN RESISTANT STAPHYLOCOCCUS AUREUS  Final   Organism ID,  Bacteria MORGANELLA MORGANII  Final      Susceptibility   Morganella morganii - MIC*    AMPICILLIN >=32 RESISTANT Resistant     CEFAZOLIN >=64 RESISTANT Resistant     CEFEPIME <=1 SENSITIVE Sensitive     CEFTAZIDIME <=1 SENSITIVE Sensitive     CEFTRIAXONE <=1 SENSITIVE Sensitive     CIPROFLOXACIN <=0.25 SENSITIVE Sensitive     GENTAMICIN <=1 SENSITIVE Sensitive     IMIPENEM 1 SENSITIVE Sensitive     TRIMETH/SULFA <=20 SENSITIVE Sensitive     AMPICILLIN/SULBACTAM >=32 RESISTANT Resistant     PIP/TAZO <=4 SENSITIVE Sensitive     * MODERATE MORGANELLA MORGANII   Methicillin resistant staphylococcus aureus - MIC*    CIPROFLOXACIN >=8 RESISTANT Resistant     ERYTHROMYCIN >=8 RESISTANT Resistant     GENTAMICIN <=0.5 SENSITIVE Sensitive     OXACILLIN >=4 RESISTANT Resistant     TETRACYCLINE <=1 SENSITIVE Sensitive     VANCOMYCIN <=0.5 SENSITIVE Sensitive     TRIMETH/SULFA <=10 SENSITIVE Sensitive     CLINDAMYCIN <=0.25 SENSITIVE Sensitive     RIFAMPIN <=0.5 SENSITIVE Sensitive     Inducible Clindamycin NEGATIVE Sensitive     * ABUNDANT METHICILLIN RESISTANT STAPHYLOCOCCUS AUREUS    Radiology Reports US Renal  Result Date: 12/22/2017 CLINICAL DATA:  Chronic renal disease, stage III. EXAM: RENAL / URINARY TRACT ULTRASOUND COMPLETE COMPARISON:  02/27/2017 FINDINGS: Right Kidney: Length: 11.7 cm. Echogenicity within normal limits. No mass or hydronephrosis visualized. Left Kidney: Length: 10.7 cm. Echogenicity within normal limits. No mass or hydronephrosis visualized. Linear nonobstructive calculus measuring 12 mm in the lower pole of the left kidney. Bladder: Nondistended and therefore not well evaluated. IMPRESSION: Study limited by body habitus. Left nonobstructive nephrolithiasis. Normal cortical thickness the kidneys. Nondistended and therefore not well evaluated urinary bladder. Electronically Signed   By: Fidela Salisbury M.D.   On: 12/22/2017 10:45   Mr Foot Right Wo  Contrast  Result Date: 12/24/2017 CLINICAL DATA:  Open wound right foot. History of prior amputation. Diabetic. EXAM: MRI OF THE RIGHT FOREFOOT WITHOUT CONTRAST TECHNIQUE: Multiplanar, multisequence MR imaging of the right foot was performed. No intravenous contrast was administered. COMPARISON:  Radiographs 12/21/2017 FINDINGS: Evidence of prior amputation involving the forefoot. No MR findings to suggest osteomyelitis. Subcutaneous soft tissue swelling/edema involving the soft tissues overlying the amputation site. Is also an open wound with some gas. Small fluid collection is noted along the lateral aspect of the foot near the lateral cuneiform. This measures 16 mm and could be a small abscess. Myositis involving the  short flexor muscles but no findings for pyomyositis. No findings for septic arthritis. IMPRESSION: 1. Cellulitis and possible 16 mm abscess as described above. 2. Myofasciitis but no findings for pyomyositis. 3. No findings for septic arthritis or osteomyelitis. Electronically Signed   By: Marijo Sanes M.D.   On: 12/24/2017 12:01   US Arterial Abi (screening Lower Extremity)  Result Date: 12/22/2017 CLINICAL DATA:  Previous right transmetatarsal amputation 2010. Plantar ulcer times weeks. Diabetes, hypertension, hyperlipidemia, obesity. EXAM: NONINVASIVE PHYSIOLOGIC VASCULAR STUDY OF BILATERAL LOWER EXTREMITIES TECHNIQUE: Evaluation of both lower extremities were performed at rest, including calculation of ankle-brachial indices with single level Doppler, pressure and pulse volume recording. COMPARISON:  05/03/2009 FINDINGS: Right ABI:  1.24 Left ABI: 1.53 Right Lower Extremity:  Biphasic distal arterial waveforms. Left Lower Extremity:  Biphasic distal arterial waveforms. IMPRESSION: Elevated ABIs suggesting non diagnostic secondary to incompressible vessel calcifications (medial arterial sclerosis of [place]ckeberg). Should the patient fail conservative treatment, consider MRA runoff (preferred  over the CTA in the setting of vascular calcifications; no radiation risk, can be performed noncontrast in the setting of renal dysfunction) to better define the site and nature of arterial occlusive disease and delineate treatment options. Electronically Signed   By: Lucrezia Europe M.D.   On: 12/22/2017 10:50   Dg Foot Complete Right  Result Date: 12/21/2017 CLINICAL DATA:  Right foot pain and redness of the amputation site. EXAM: RIGHT FOOT COMPLETE - 3+ VIEW COMPARISON:  04/27/2009 FINDINGS: There has been interval amputation of the right foot at the level of the tarsometatarsal joints. There is soft tissue thickening overlying the amputation site. Deep ulcer is seen within the soft tissues in the medial aspect of the amputation site. With small amount of soft tissue emphysema. No osteolytic changes of the underlying osseous structures. IMPRESSION: Status post amputation at the level of the tarsometatarsal joints. Significant soft tissue swelling at the amputation site with a deep ulcer medially, and small amount of soft tissue emphysema. Findings are suspicious for cellulitis/fasciitis, possibly necrotizing. No radiographic evidence of osteomyelitis. Electronically Signed   By: Fidela Salisbury M.D.   On: 12/21/2017 13:06    Time Spent in minutes  25   Taiki Buckwalter M.D on 12/24/2017 at 2:39 PM  Between 7am to 7pm - Pager - [phone]  After 7pm go to [email or web address] - password [facility] [address] [address]  [facility] -  Office  [phone]

## 2017-12-25 DIAGNOSIS — E785 Hyperlipidemia, unspecified: Secondary | ICD-10-CM

## 2017-12-25 DIAGNOSIS — E119 Type 2 diabetes mellitus without complications: Secondary | ICD-10-CM

## 2017-12-25 DIAGNOSIS — N183 Chronic kidney disease, stage 3 (moderate): Secondary | ICD-10-CM

## 2017-12-25 DIAGNOSIS — I10 Essential (primary) hypertension: Secondary | ICD-10-CM

## 2017-12-25 DIAGNOSIS — N179 Acute kidney failure, unspecified: Secondary | ICD-10-CM

## 2017-12-25 DIAGNOSIS — Z794 Long term (current) use of insulin: Secondary | ICD-10-CM

## 2017-12-25 LAB — RENAL FUNCTION PANEL
Albumin: 2.5 g/dL — ABNORMAL LOW (ref 3.5–5.0)
Anion gap: 7 (ref 5–15)
BUN: 48 mg/dL — ABNORMAL HIGH (ref 6–20)
CHLORIDE: 106 mmol/L (ref 101–111)
CO2: 21 mmol/L — AB (ref 22–32)
Calcium: 8.4 mg/dL — ABNORMAL LOW (ref 8.9–10.3)
Creatinine, Ser: 2.49 mg/dL — ABNORMAL HIGH (ref 0.44–1.00)
GFR calc Af Amer: 22 mL/min — ABNORMAL LOW (ref >60)
GFR calc non Af Amer: 19 mL/min — ABNORMAL LOW (ref >60)
Glucose, Bld: 99 mg/dL (ref 65–99)
PHOSPHORUS: 4.1 mg/dL (ref 2.5–4.6)
Potassium: 4.9 mmol/L (ref 3.5–5.1)
SODIUM: 134 mmol/L — AB (ref 135–145)

## 2017-12-25 LAB — BASIC METABOLIC PANEL
Anion gap: 8 (ref 5–15)
BUN: 48 mg/dL — AB (ref 6–20)
CALCIUM: 8.5 mg/dL — AB (ref 8.9–10.3)
CO2: 20 mmol/L — ABNORMAL LOW (ref 22–32)
CREATININE: 2.49 mg/dL — AB (ref 0.44–1.00)
Chloride: 106 mmol/L (ref 101–111)
GFR calc Af Amer: 22 mL/min — ABNORMAL LOW (ref >60)
GFR, EST NON AFRICAN AMERICAN: 19 mL/min — AB (ref >60)
GLUCOSE: 99 mg/dL (ref 65–99)
POTASSIUM: 4.9 mmol/L (ref 3.5–5.1)
SODIUM: 134 mmol/L — AB (ref 135–145)

## 2017-12-25 LAB — GLUCOSE, CAPILLARY
GLUCOSE-CAPILLARY: 121 mg/dL — AB (ref 65–99)
Glucose-Capillary: 92 mg/dL (ref 65–99)

## 2017-12-25 MED ORDER — CIPROFLOXACIN HCL 500 MG PO TABS: 500.0000 mg | ORAL_TABLET | Freq: Two times a day (BID) | ORAL | 0 refills | Status: AC

## 2017-12-25 MED ORDER — CLINDAMYCIN HCL 300 MG PO CAPS: 300.0000 mg | ORAL_CAPSULE | Freq: Three times a day (TID) | ORAL | 0 refills | Status: AC

## 2017-12-25 MED ORDER — AMLODIPINE BESYLATE-VALSARTAN 10-160 MG PO TABS: 1.0000 | ORAL_TABLET | Freq: Every day | ORAL | Status: DC

## 2017-12-25 NOTE — Care Management Note (Signed)
Case Management Note  Patient Details  Name: Courtney Schneider MRN: [id number] Date of Birth: [birth year]  Subjective/Objective:        Admitted with abscess. Pt from home, ind pta. She has PCP, insurance with drug coverage and transportation. Has daughter who is an Therapist, sports that will perform wound care. Pt has RW to use.             Action/Plan: DC home today with [facility] PT. Pt hesitant but agreeable. She has used [facility] in the past and would like to use them again. Aware HH has 48 hrs to make first visit. Juliann Pulse, [facility] rep, aware of referral and will have branch pull info from chart.   Expected Discharge Date:       12/25/17           Expected Discharge Plan:  [place]  In-House Referral:  NA  Discharge planning Services  CM Consult  Post Acute Care Choice:  Home Health Choice offered to:  Patient  DME Arranged:    DME Agency:     HH Arranged:  PT [place]:  [place]  Status of Service:  Completed, signed off  If discussed at [place] of Stay Meetings, dates discussed:    Additional Comments:  Sherald Barge, RN 12/25/2017, 11:38 AM

## 2017-12-25 NOTE — Plan of Care (Signed)
  Problem: Acute Rehab PT Goals(only PT should resolve) Goal: Pt Will Go Supine/Side To Sit Outcome: Progressing Flowsheets (Taken 12/25/2017 1219) Pt will go Supine/Side to Sit: with min guard assist Goal: Patient Will Transfer Sit To/From Stand Outcome: Progressing Flowsheets (Taken 12/25/2017 1219) Patient will transfer sit to/from stand: with supervision Goal: Pt Will Transfer Bed To Chair/Chair To Bed Outcome: Progressing Flowsheets (Taken 12/25/2017 1219) Pt will Transfer Bed to Chair/Chair to Bed: with supervision Goal: Pt Will Ambulate Outcome: Progressing Flowsheets (Taken 12/25/2017 1219) Pt will Ambulate: with supervision;with rolling walker;50 feet  12:20 PM, 12/25/17 Lonell Grandchild, MPT Physical Therapist with [facility] 336 [phone] office [phone] mobile phone

## 2017-12-25 NOTE — Evaluation (Signed)
Physical Therapy Evaluation Patient Details Name: Courtney Schneider MRN: [id number] DOB: 1946/04/25 Today's Date: 12/25/2017   History of Present Illness  Courtney Schneider is a 72 y.o. female with medical history significant of diabetes who has a right foot wound on the plantar aspect of her foot which is been present for several weeks now.  She is missing her primary care physician about this and undergoing dressing change.  It was also briefly debrided in the office.  Over the past several days, she has noticed some foul smelling drainage coming from wound and worsening erythema/swelling of her foot.  She has had a prior transmetatarsal amputation of this foot in 2010.  She has felt feverish and some chills.  Denies any vomiting, diarrhea, shortness of breath, chest pain    Clinical Impression  Patient limited for functional mobility as stated below secondary to RLE weakness, fatigue and fair/poor standing balance when attempting to limit weight bearing on right foot wound.  Patient will benefit from continued physical therapy in hospital and recommended venue below to increase strength, balance, endurance for safe ADLs and gait.     Follow Up Recommendations Home health PT;Supervision for mobility/OOB    Equipment Recommendations  None recommended by PT    Recommendations for Other Services       Precautions / Restrictions Precautions Precautions: Fall Restrictions Weight Bearing Restrictions: No      Mobility  Bed Mobility Overal bed mobility: Needs Assistance Bed Mobility: Supine to Sit;Sit to Supine     Supine to sit: Min assist Sit to supine: Min guard   General bed mobility comments: slow labored movement for sitting up at bedside  Transfers Overall transfer level: Needs assistance Equipment used: Rolling walker (2 wheeled) Transfers: Sit to/from [facility] Sit to Stand: Supervision Stand pivot transfers: Min guard       General transfer  comment: labored movement  Ambulation/Gait Ambulation/Gait assistance: Min guard Ambulation Distance (Feet): 20 Feet Assistive device: Rolling walker (2 wheeled) Gait Pattern/deviations: Decreased step length - right;Decreased stance time - right;Decreased stride length Gait velocity: slow   General Gait Details: demonstrates slow labored movement with fair return for keeping body weight off right foot wound (wearing post op shoe right foot), limited secondary to c/o fatigue   Stairs            Wheelchair Mobility    Modified Rankin (Stroke Patients Only)       Balance Overall balance assessment: Needs assistance Sitting-balance support: Feet supported;No upper extremity supported Sitting balance-Leahy Scale: Good     Standing balance support: Bilateral upper extremity supported;During functional activity Standing balance-Leahy Scale: Fair Standing balance comment: using RW                             Pertinent Vitals/Pain Pain Assessment: 0-10 Pain Score: 2  Pain Location: posterior neck Pain Descriptors / Indicators: Aching;Sore;Discomfort Pain Intervention(s): Limited activity within patient's tolerance;Monitored during session    [place] expects to be discharged to:: Private residence Living Arrangements: Alone Available Help at Discharge: Family;Friend(s) Type of Home: House Home Access: Ramped entrance     Home Layout: Laundry or work area in basement(Patient does not go to basement) Home Equipment: Environmental consultant - 2 wheels;Cane - single point;Wheelchair - manual;Bedside commode      Prior Function Level of Independence: Independent with assistive device(s)         Comments: Hydrographic surveyor with SPC PRN,  drives     Hand Dominance        Extremity/Trunk Assessment   Upper Extremity Assessment Upper Extremity Assessment: Generalized weakness    Lower Extremity Assessment Lower Extremity Assessment: Generalized  weakness    Cervical / Trunk Assessment Cervical / Trunk Assessment: Normal  Communication   Communication: No difficulties  Cognition Arousal/Alertness: Awake/alert Behavior During Therapy: WFL for tasks assessed/performed Overall Cognitive Status: Within Functional Limits for tasks assessed                                        General Comments      Exercises     Assessment/Plan    PT Assessment Patient needs continued PT services  PT Problem List Decreased strength;Decreased activity tolerance;Decreased balance;Decreased mobility       PT Treatment Interventions Gait training;Stair training;Functional mobility training;Therapeutic activities;Therapeutic exercise;Patient/family education    PT Goals (Current goals can be found in the Care Plan section)  Acute Rehab PT Goals Patient Stated Goal: return home with family to assist PT Goal Formulation: With patient/family Time For Goal Achievement: 01/01/18 Potential to Achieve Goals: Good    Frequency Min 3X/week   Barriers to discharge        Co-evaluation               AM-PAC PT "6 Clicks" Daily Activity  Outcome Measure Difficulty turning over in bed (including adjusting bedclothes, sheets and blankets)?: A Little Difficulty moving from lying on back to sitting on the side of the bed? : A Lot Difficulty sitting down on and standing up from a chair with arms (e.g., wheelchair, bedside commode, etc,.)?: A Little Help needed moving to and from a bed to chair (including a wheelchair)?: A Little Help needed walking in hospital room?: A Little Help needed climbing 3-5 steps with a railing? : A Lot 6 Click Score: 16    End of Session   Activity Tolerance: Patient tolerated treatment well;Patient limited by fatigue Patient left: in chair;with call bell/phone within reach;with family/visitor present Nurse Communication: Mobility status PT Visit Diagnosis: Unsteadiness on feet (R26.81);Other  abnormalities of gait and mobility (R26.89);Muscle weakness (generalized) (M62.81)    Time: 1005-1030 PT Time Calculation (min) (ACUTE ONLY): 25 min   Charges:   PT Evaluation $PT Eval Moderate Complexity: 1 Mod PT Treatments $Therapeutic Activity: 23-37 mins   PT G Codes:        12:17 PM, Jan 17, 2018 Lonell Grandchild, MPT Physical Therapist with [facility] 336 [phone] office [phone] mobile phone

## 2017-12-25 NOTE — Progress Notes (Signed)
Patient urinated large amount in bed. Purewick in place but did not catch urine. Patient soaked through 2 pads and sheet.

## 2017-12-25 NOTE — Progress Notes (Signed)
Patient's dressing changed per order. Patient tolerated well.

## 2017-12-25 NOTE — Progress Notes (Signed)
Discharge instructions reviewed with patient. Daughter Steffanie Dunn at bedside. Both verbalized understanding of instructions, follow-up and prescriptions have been sent to her pharmacy by MD. Verbalized understanding of wound care at home. [facility] for home health arranged by CM. Pt aware they will call to arrange visit. IV site d/c'd, site within normal limits. Pt in stable condition awaiting w/c and nursing staff to accompany off unit for discharge. Donavan Foil, RN

## 2017-12-25 NOTE — Discharge Summary (Signed)
Physician Discharge Summary  Courtney Schneider HEN:[id number] DOB: [birth year] DOA: 12/21/2017  PCP: Celene Squibb, MD  Admit date: 12/21/2017 Discharge date: 12/25/2017  Time spent: 35 minutes  Recommendations for Outpatient Follow-up:  1. Basic metabolic panel to follow electrolytes and renal function 2. Reassess blood pressure and adjust antihypertensive regimen as needed 3. Close follow-up the patient CBGs/A1c will further adjustment into her hypoglycemic regimen as needed.   Discharge Diagnoses:  Active Problems:   Cellulitis   AKI (acute kidney injury) ([place])   HTN (hypertension)   HLD (hyperlipidemia)   Insulin dependent type 2 diabetes mellitus (HCC)   Diabetic foot ulcer (HCC)   ATN (acute tubular necrosis) (HCC)   Acute renal failure superimposed on stage 3 chronic kidney disease (HCC)   MRSA cellulitis of right foot morbid obesity, BMI 43.63  Discharge Condition: Stable and improved.  Patient discharged home with instructions to follow-up with PCP in 10 days and also with general surgery in 2-3 weeks as instructed.  Home health physical therapy arranged at discharge.  Diet recommendation: Modified carbohydrates, low calorie and heart healthy diet.  Filed Weights   12/21/17 0959 12/21/17 1626  Weight: 104.3 kg (230 lb) 115.3 kg (254 lb 3.1 oz)    History of present illness:  As per H&P written by Dr. Roderic Palau on 12/21/17 72 y.o. female with medical history significant of diabetes who has a right foot wound on the plantar aspect of her foot which is been present for several weeks now.  She is missing her primary care physician about this and undergoing dressing change.  It was also briefly debrided in the office.  Over the past several days, she has noticed some foul smelling drainage coming from wound and worsening erythema/swelling of her foot.  She has had a prior transmetatarsal amputation of this foot in 2010.  She has felt feverish and some chills.  Denies any vomiting,  diarrhea, shortness of breath, chest pain  Hospital Course:  1-cellulitis/infected diabetic foot ulcer with abscess -Following recommendations by general surgery and given the fact of no osteomyelitis on images studies; patient will be discharged with instructions for wound care, outpatient follow-up with general surgery and oral antibiotics course. -Bedside debridement was provided and wound care instructions given at bedside  -continue PRN vicodin for pain management   2-acute on chronic renal failure: stage 3 at bedside -due to pre-renal azotemia  -improved with IVF's -continue holding ARB at discharge until 5/31 -advise to keep herself well hydrated   3-morbid obesity -Body mass index is 43.63 kg/m. -low calorie diet, portion control and increase physical activity discussed with patient .  4-HLD -continue statins   5-insulin dependent diabetes with nephropathy -modified carb diet advised -resume home hypoglycemic regimen  -close outpatient follow up for further adjustments on her regimen   6-hyperkalemia -resolved -recomnending repeat BMET at follow up visit to reassess electrolytes   7-anemia of CKD -stable and at baseline -no transfusion needed   8-HTN -stable overall -resume home antihypertensive regimen, except for ARB (which would be on hold until 5/31) -low sodium diet recommended    Procedures:  See below for x-ray reports   Consultations:  General surgery  Renal service   Discharge Exam: Vitals:   12/24/17 2113 12/25/17 0838  BP: 138/89 (!) 149/41  Pulse: 69 70  Resp:  20  Temp: 98.5 F (36.9 C) 99.4 F (37.4 C)  SpO2: 97% 95%   Not in distress, afebrile and looking to go home.  Musculoskeletal:  Warm, no edema Chest: Clear bilaterally CVS: Normal S1-S2 GI: Soft, nondistended, nontender Skin: Right foot with forefoot amputation and open ulcer measuring 1 cm on the plantar surface with mild serosanguineous discharge,  nontender.   Discharge Instructions   Discharge Instructions    Diet - low sodium heart healthy   Complete by:  As directed    Diet Carb Modified   Complete by:  As directed    Discharge instructions   Complete by:  As directed    Medications as prescribed Keep yourself well-hydrated Remember to clean wound with oxygen peroxide twice a day as instructed by general surgery and to follow-up with general surgery service in 2-3 weeks. Follow low-sodium diet and modify carbohydrates diet Arrange follow-up with PCP in 10 days Please noticed instructions on your amlodipine (resume on 5/31) Antibiotics to be taken around food to minimize GI upset symptoms; plan is to treat for 10 days.     Allergies as of 12/25/2017   No Known Allergies     Medication List    STOP taking these medications   ALEVE 220 MG tablet Generic drug:  naproxen sodium     TAKE these medications   amLODipine-valsartan 10-160 MG tablet Commonly known as:  EXFORGE Take 1 tablet by mouth daily. Resume on 5/31 Start taking on:  12/27/2017 What changed:    additional instructions  These instructions start on 12/27/2017. If you are unsure what to do until then, ask your doctor or other care provider.   aspirin EC 81 MG tablet Take 81 mg by mouth daily.   ciprofloxacin 500 MG tablet Commonly known as:  CIPRO Take 1 tablet (500 mg total) by mouth 2 (two) times daily for 10 days.   clindamycin 300 MG capsule Commonly known as:  CLEOCIN Take 1 capsule (300 mg total) by mouth 3 (three) times daily for 10 days.   furosemide 40 MG tablet Commonly known as:  LASIX Take 40 mg by mouth.   HYDROcodone-acetaminophen 5-325 MG tablet Commonly known as:  NORCO/VICODIN Take 1 tablet by mouth every 4 (four) hours as needed.   insulin glargine 100 UNIT/ML injection Commonly known as:  LANTUS Inject 26 Units into the skin at bedtime.   metoprolol tartrate 50 MG tablet Commonly known as:  LOPRESSOR Take 50 mg by  mouth daily.   pioglitazone 15 MG tablet Commonly known as:  ACTOS Take 15 mg by mouth daily.   pravastatin 40 MG tablet Commonly known as:  PRAVACHOL Take 40 mg by mouth daily.   PRESERVISION AREDS PO Take 1 tablet by mouth daily.      No Known Allergies Follow-up Information    Aviva Signs, MD. Schedule an appointment as soon as possible for a visit on 01/16/2018.   Specialty:  General Surgery Contact information: [address] [address] [phone]        [facility].   Specialty:  Home Health Services Why:  they will call you to schedule first visit. Contact information: [address] [place] [address] [phone]        Celene Squibb, MD. Schedule an appointment as soon as possible for a visit in 10 day(s).   Specialty:  Internal Medicine Why:  Call office for appointment.  Contact informationLinna Hoff [place] [address]            The results of significant diagnostics from this hospitalization (including imaging, microbiology, ancillary and laboratory) are listed below for reference.  Significant Diagnostic Studies: US Renal  Result Date: 12/22/2017 CLINICAL DATA:  Chronic renal disease, stage III. EXAM: RENAL / URINARY TRACT ULTRASOUND COMPLETE COMPARISON:  02/27/2017 FINDINGS: Right Kidney: Length: 11.7 cm. Echogenicity within normal limits. No mass or hydronephrosis visualized. Left Kidney: Length: 10.7 cm. Echogenicity within normal limits. No mass or hydronephrosis visualized. Linear nonobstructive calculus measuring 12 mm in the lower pole of the left kidney. Bladder: Nondistended and therefore not well evaluated. IMPRESSION: Study limited by body habitus. Left nonobstructive nephrolithiasis. Normal cortical thickness the kidneys. Nondistended and therefore not well evaluated urinary bladder. Electronically Signed   By: Fidela Salisbury M.D.   On: 12/22/2017 10:45   Mr Foot Right Wo  Contrast  Result Date: 12/24/2017 CLINICAL DATA:  Open wound right foot. History of prior amputation. Diabetic. EXAM: MRI OF THE RIGHT FOREFOOT WITHOUT CONTRAST TECHNIQUE: Multiplanar, multisequence MR imaging of the right foot was performed. No intravenous contrast was administered. COMPARISON:  Radiographs 12/21/2017 FINDINGS: Evidence of prior amputation involving the forefoot. No MR findings to suggest osteomyelitis. Subcutaneous soft tissue swelling/edema involving the soft tissues overlying the amputation site. Is also an open wound with some gas. Small fluid collection is noted along the lateral aspect of the foot near the lateral cuneiform. This measures 16 mm and could be a small abscess. Myositis involving the short flexor muscles but no findings for pyomyositis. No findings for septic arthritis. IMPRESSION: 1. Cellulitis and possible 16 mm abscess as described above. 2. Myofasciitis but no findings for pyomyositis. 3. No findings for septic arthritis or osteomyelitis. Electronically Signed   By: Marijo Sanes M.D.   On: 12/24/2017 12:01   US Arterial Abi (screening Lower Extremity)  Result Date: 12/22/2017 CLINICAL DATA:  Previous right transmetatarsal amputation 2010. Plantar ulcer times weeks. Diabetes, hypertension, hyperlipidemia, obesity. EXAM: NONINVASIVE PHYSIOLOGIC VASCULAR STUDY OF BILATERAL LOWER EXTREMITIES TECHNIQUE: Evaluation of both lower extremities were performed at rest, including calculation of ankle-brachial indices with single level Doppler, pressure and pulse volume recording. COMPARISON:  05/03/2009 FINDINGS: Right ABI:  1.24 Left ABI: 1.53 Right Lower Extremity:  Biphasic distal arterial waveforms. Left Lower Extremity:  Biphasic distal arterial waveforms. IMPRESSION: Elevated ABIs suggesting non diagnostic secondary to incompressible vessel calcifications (medial arterial sclerosis of [place]). Should the patient fail conservative treatment, consider MRA runoff (preferred  over the CTA in the setting of vascular calcifications; no radiation risk, can be performed noncontrast in the setting of renal dysfunction) to better define the site and nature of arterial occlusive disease and delineate treatment options. Electronically Signed   By: Lucrezia Europe M.D.   On: 12/22/2017 10:50   Dg Foot Complete Right  Result Date: 12/21/2017 CLINICAL DATA:  Right foot pain and redness of the amputation site. EXAM: RIGHT FOOT COMPLETE - 3+ VIEW COMPARISON:  04/27/2009 FINDINGS: There has been interval amputation of the right foot at the level of the tarsometatarsal joints. There is soft tissue thickening overlying the amputation site. Deep ulcer is seen within the soft tissues in the medial aspect of the amputation site. With small amount of soft tissue emphysema. No osteolytic changes of the underlying osseous structures. IMPRESSION: Status post amputation at the level of the tarsometatarsal joints. Significant soft tissue swelling at the amputation site with a deep ulcer medially, and small amount of soft tissue emphysema. Findings are suspicious for cellulitis/fasciitis, possibly necrotizing. No radiographic evidence of osteomyelitis. Electronically Signed   By: Fidela Salisbury M.D.   On: 12/21/2017 13:06    Microbiology: Recent Results (from  the past 240 hour(s))  Blood Cultures x 2 sites     Status: None (Preliminary result)   Collection Time: 12/21/17  5:13 PM  Result Value Ref Range Status   Specimen Description LEFT ANTECUBITAL  Final   Special Requests   Final    BOTTLES DRAWN AEROBIC AND ANAEROBIC Blood Culture adequate volume   Culture   Final    NO GROWTH 4 DAYS Performed at [facility], [address] [place], [place] [address]    Report Status PENDING  Incomplete  Blood Cultures x 2 sites     Status: None (Preliminary result)   Collection Time: 12/21/17  5:20 PM  Result Value Ref Range Status   Specimen Description BLOOD RIGHT ARM  Final   Special Requests    Final    BOTTLES DRAWN AEROBIC AND ANAEROBIC Blood Culture adequate volume   Culture   Final    NO GROWTH 4 DAYS Performed at [facility], [address], [place], [place] [address]    Report Status PENDING  Incomplete  Aerobic Culture (superficial specimen)     Status: None   Collection Time: 12/22/17  6:53 AM  Result Value Ref Range Status   Specimen Description   Final    FOOT RIGHT FOOT Performed at [facility], [address] [place], [place] [address]    Special Requests   Final    NONE Performed at [facility], [address] [place], [place] [address]    Gram Stain   Final    RARE WBC PRESENT, PREDOMINANTLY PMN ABUNDANT GRAM POSITIVE COCCI ABUNDANT GRAM NEGATIVE RODS FEW GRAM POSITIVE RODS Performed at [facility], [place] [address], [place], [place] [address]    Culture   Final    ABUNDANT METHICILLIN RESISTANT STAPHYLOCOCCUS AUREUS MODERATE MORGANELLA MORGANII    Report Status 12/24/2017 FINAL  Final   Organism ID, Bacteria METHICILLIN RESISTANT STAPHYLOCOCCUS AUREUS  Final   Organism ID, Bacteria MORGANELLA MORGANII  Final      Susceptibility   Morganella morganii - MIC*    AMPICILLIN >=32 RESISTANT Resistant     CEFAZOLIN >=64 RESISTANT Resistant     CEFEPIME <=1 SENSITIVE Sensitive     CEFTAZIDIME <=1 SENSITIVE Sensitive     CEFTRIAXONE <=1 SENSITIVE Sensitive     CIPROFLOXACIN <=0.25 SENSITIVE Sensitive     GENTAMICIN <=1 SENSITIVE Sensitive     IMIPENEM 1 SENSITIVE Sensitive     TRIMETH/SULFA <=20 SENSITIVE Sensitive     AMPICILLIN/SULBACTAM >=32 RESISTANT Resistant     PIP/TAZO <=4 SENSITIVE Sensitive     * MODERATE MORGANELLA MORGANII   Methicillin resistant staphylococcus aureus - MIC*    CIPROFLOXACIN >=8 RESISTANT Resistant     ERYTHROMYCIN >=8 RESISTANT Resistant     GENTAMICIN <=0.5 SENSITIVE Sensitive     OXACILLIN >=4 RESISTANT Resistant     TETRACYCLINE <=1 SENSITIVE Sensitive     VANCOMYCIN <=0.5 SENSITIVE Sensitive      TRIMETH/SULFA <=10 SENSITIVE Sensitive     CLINDAMYCIN <=0.25 SENSITIVE Sensitive     RIFAMPIN <=0.5 SENSITIVE Sensitive     Inducible Clindamycin NEGATIVE Sensitive     * ABUNDANT METHICILLIN RESISTANT STAPHYLOCOCCUS AUREUS     Labs: Basic Metabolic Panel: Recent Labs  Lab 12/21/17 1159 12/22/17 0626 12/23/17 0606 12/24/17 0509 12/25/17 0411  NA 133* 132* 131* 134*  134* 134*  134*  K 4.7 4.8 4.7 5.3*  5.4* 4.9  4.9  CL 100* 103 105 108  108 106  106  CO2 22 22 17* 19*  20* 21*  20*  GLUCOSE 151* 102* 102* 117*  118* 99  99  BUN 54* 57* 60* 53*  53* 48*  48*  CREATININE 2.80* 3.08* 3.09* 2.55*  2.64* 2.49*  2.49*  CALCIUM 8.9 8.2* 7.8* 8.2*  8.2* 8.4*  8.5*  PHOS  --   --   --  4.6 4.1   Liver Function Tests: Recent Labs  Lab 12/22/17 0626 12/24/17 0509 12/25/17 0411  AST 32  --   --   ALT 14  --   --   ALKPHOS 56  --   --   BILITOT 0.8  --   --   PROT 5.9*  --   --   ALBUMIN 2.6* 2.4* 2.5*   No results for input(s): LIPASE, AMYLASE in the last 168 hours. No results for input(s): AMMONIA in the last 168 hours. CBC: Recent Labs  Lab 12/21/17 1159 12/22/17 0626  WBC 18.7* 13.0*  NEUTROABS 14.9*  --   HGB 10.6* 9.3*  HCT 32.8* 29.6*  MCV 92.9 94.0  PLT 248 244   Cardiac Enzymes: No results for input(s): CKTOTAL, CKMB, CKMBINDEX, TROPONINI in the last 168 hours. BNP: BNP (last 3 results) No results for input(s): BNP in the last 8760 hours.  ProBNP (last 3 results) No results for input(s): PROBNP in the last 8760 hours.  CBG: Recent Labs  Lab 12/24/17 1220 12/24/17 1604 12/24/17 2113 12/25/17 0804 12/25/17 1129  GLUCAP 119* 147* 121* 92 121*       Signed:  Barton Dubois MD.  [facility] 12/25/2017, 1:38 PM

## 2017-12-25 NOTE — Progress Notes (Signed)
Subjective: Patient has no complaints.  Objective: Vital signs in last 24 hours: Temp:  [98.5 F (36.9 C)-99.4 F (37.4 C)] 99.4 F (37.4 C) (05/29 0838) Pulse Rate:  [69-88] 70 (05/29 0838) Resp:  [20] 20 (05/29 0838) BP: (101-149)/(41-89) 149/41 (05/29 0838) SpO2:  [92 %-97 %] 95 % (05/29 0838) Last BM Date: 12/23/17  Intake/Output from previous day: 05/28 0701 - 05/29 0700 In: 2887.1 [P.O.:480; I.V.:2057.1; IV Piggyback:350] Out: 68 [Urine:900] Intake/Output this shift: Total I/O In: 240 [P.O.:240] Out: -   General appearance: alert, cooperative and no distress Extremities: A bullae has formed along the medial aspect of the right foot amputation flap.  This was unroofed and a small amount of purulent drainage was found.  The soft tissue deep to this did not appear necrotic.  Open wound cleaned.  Lab Results:  No results for input(s): WBC, HGB, HCT, PLT in the last 72 hours. BMET Recent Labs    12/24/17 0509 12/25/17 0411  NA 134*  134* 134*  134*  K 5.3*  5.4* 4.9  4.9  CL 108  108 106  106  CO2 19*  20* 21*  20*  GLUCOSE 117*  118* 99  99  BUN 53*  53* 48*  48*  CREATININE 2.55*  2.64* 2.49*  2.49*  CALCIUM 8.2*  8.2* 8.4*  8.5*   PT/INR No results for input(s): LABPROT, INR in the last 72 hours.  Studies/Results: Mr Foot Right Wo Contrast  Result Date: 12/24/2017 CLINICAL DATA:  Open wound right foot. History of prior amputation. Diabetic. EXAM: MRI OF THE RIGHT FOREFOOT WITHOUT CONTRAST TECHNIQUE: Multiplanar, multisequence MR imaging of the right foot was performed. No intravenous contrast was administered. COMPARISON:  Radiographs 12/21/2017 FINDINGS: Evidence of prior amputation involving the forefoot. No MR findings to suggest osteomyelitis. Subcutaneous soft tissue swelling/edema involving the soft tissues overlying the amputation site. Is also an open wound with some gas. Small fluid collection is noted along the lateral aspect of the  foot near the lateral cuneiform. This measures 16 mm and could be a small abscess. Myositis involving the short flexor muscles but no findings for pyomyositis. No findings for septic arthritis. IMPRESSION: 1. Cellulitis and possible 16 mm abscess as described above. 2. Myofasciitis but no findings for pyomyositis. 3. No findings for septic arthritis or osteomyelitis. Electronically Signed   By: Marijo Sanes M.D.   On: 12/24/2017 12:01    Anti-infectives: Anti-infectives (From admission, onward)   Start     Dose/Rate Route Frequency Ordered Stop   12/24/17 1600  ciprofloxacin (CIPRO) IVPB 400 mg     400 mg 200 mL/hr over 60 Minutes Intravenous Every 24 hours 12/24/17 1557     12/22/17 1900  aztreonam (AZACTAM) 0.5 g in dextrose 5 % 50 mL IVPB  Status:  Discontinued     0.5 g 100 mL/hr over 30 Minutes Intravenous Every 8 hours 12/22/17 1338 12/22/17 1346   12/22/17 1900  aztreonam (AZACTAM) 1 g in sodium chloride 0.9 % 100 mL IVPB  Status:  Discontinued     1 g 200 mL/hr over 30 Minutes Intravenous Every 8 hours 12/22/17 1346 12/24/17 1446   12/21/17 1700  clindamycin (CLEOCIN) IVPB 900 mg     900 mg 100 mL/hr over 30 Minutes Intravenous Every 8 hours 12/21/17 1637     12/21/17 1700  aztreonam (AZACTAM) 1 g in sodium chloride 0.9 % 100 mL IVPB  Status:  Discontinued     1 g 200 mL/hr over  30 Minutes Intravenous Every 8 hours 12/21/17 1606 12/22/17 1339   12/21/17 1200  vancomycin (VANCOCIN) IVPB 1000 mg/200 mL premix     1,000 mg 200 mL/hr over 60 Minutes Intravenous  Once 12/21/17 1155 12/21/17 1330      Assessment/Plan: Impression: Cellulitis of right lower extremity, foot, resolving.  Small superficial abscess drained. Plan: Okay for discharge from surgery standpoint.  Daughter who is a nurse will be cleaning the wound daily with a Q-tip and peroxide.  Agree with 10-day course of antibiotics as an outpatient.  Will follow-up in my office in 3 weeks for wound check.  LOS: 4 days     Aviva Signs 12/25/2017

## 2017-12-25 NOTE — Progress Notes (Signed)
Courtney Schneider  MRN: [id number]  DOB/AGE: [birth year] 72 y.o.  Primary Care Physician:Hall, Edwinna Areola, MD  Admit date: 12/21/2017  Chief Complaint:  Chief Complaint  Patient presents with  . Foot Problem    S-Pt presented on  12/21/2017 with  Chief Complaint  Patient presents with  . Foot Problem  .    Pt today feels better  Meds . aspirin EC  81 mg Oral Daily  . enoxaparin (LOVENOX) injection  30 mg Subcutaneous Q24H  . feeding supplement (GLUCERNA SHAKE)  237 mL Oral BID BM  . furosemide  40 mg Intravenous BID  . insulin aspart  0-15 Units Subcutaneous TID WC  . insulin aspart  0-5 Units Subcutaneous QHS  . insulin glargine  26 Units Subcutaneous QHS  . multivitamin with minerals  1 tablet Oral Daily  . pravastatin  40 mg Oral q1800     Physical Exam: Vital signs in last 24 hours: Temp:  [98.5 F (36.9 C)-99.4 F (37.4 C)] 99.4 F (37.4 C) (05/29 0838) Pulse Rate:  [69-88] 70 (05/29 0838) Resp:  [20] 20 (05/29 0838) BP: (101-149)/(41-89) 149/41 (05/29 0838) SpO2:  [92 %-97 %] 95 % (05/29 0838) Weight change:  Last BM Date: 12/23/17  Intake/Output from previous day: 05/28 0701 - 05/29 0700 In: 2887.1 [P.O.:480; I.V.:2057.1; IV Piggyback:350] Out: 900 [Urine:900] Total I/O In: 240 [P.O.:240] Out: -    Physical Exam: General- pt is awake,alert, oriented to time place and person Resp- No acute REsp distress, NO Rhonchi CVS- S1S2 regular in rate and rhythm GIT- BS+, soft, NT, ND EXT- NO LE Edema, Cyanosis          Right foot amputation    Lab Results:  Hemoglobin & Hematocrit     Component Value Date/Time   HGB 9.3 (L) 12/22/2017 0626   HCT 29.6 (L) 12/22/2017 0626       BMET Recent Labs    12/24/17 0509 12/25/17 0411  NA 134*  134* 134*  134*  K 5.3*  5.4* 4.9  4.9  CL 108  108 106  106  CO2 19*  20* 21*  20*  GLUCOSE 117*  118* 99  99  BUN 53*  53* 48*  48*  CREATININE 2.55*  2.64* 2.49*  2.49*  CALCIUM 8.2*  8.2*  8.4*  8.5*   Creat trend 2019 2.8=>3.0=>2.55=>2.5 2017 1.87   MICRO Recent Results (from the past 240 hour(s))  Blood Cultures x 2 sites     Status: None (Preliminary result)   Collection Time: 12/21/17  5:13 PM  Result Value Ref Range Status   Specimen Description LEFT ANTECUBITAL  Final   Special Requests   Final    BOTTLES DRAWN AEROBIC AND ANAEROBIC Blood Culture adequate volume   Culture   Final    NO GROWTH 4 DAYS Performed at [facility], [address] [place], [place] [address]    Report Status PENDING  Incomplete  Blood Cultures x 2 sites     Status: None (Preliminary result)   Collection Time: 12/21/17  5:20 PM  Result Value Ref Range Status   Specimen Description BLOOD RIGHT ARM  Final   Special Requests   Final    BOTTLES DRAWN AEROBIC AND ANAEROBIC Blood Culture adequate volume   Culture   Final    NO GROWTH 4 DAYS Performed at [facility], [address] [place], [place] [address]    Report Status PENDING  Incomplete  Aerobic Culture (superficial specimen)  Status: None   Collection Time: 12/22/17  6:53 AM  Result Value Ref Range Status   Specimen Description   Final    FOOT RIGHT FOOT Performed at [facility], [address] [place], [place] [address]    Special Requests   Final    NONE Performed at [facility], [address] [place], [place] [address]    Gram Stain   Final    RARE WBC PRESENT, PREDOMINANTLY PMN ABUNDANT GRAM POSITIVE COCCI ABUNDANT GRAM NEGATIVE RODS FEW GRAM POSITIVE RODS Performed at [facility], [place] [address], [place], [place] [address]    Culture   Final    ABUNDANT METHICILLIN RESISTANT STAPHYLOCOCCUS AUREUS MODERATE MORGANELLA MORGANII    Report Status 12/24/2017 FINAL  Final   Organism ID, Bacteria METHICILLIN RESISTANT STAPHYLOCOCCUS AUREUS  Final   Organism ID, Bacteria MORGANELLA MORGANII  Final      Susceptibility   Morganella morganii - MIC*    AMPICILLIN >=32 RESISTANT Resistant     CEFAZOLIN  >=64 RESISTANT Resistant     CEFEPIME <=1 SENSITIVE Sensitive     CEFTAZIDIME <=1 SENSITIVE Sensitive     CEFTRIAXONE <=1 SENSITIVE Sensitive     CIPROFLOXACIN <=0.25 SENSITIVE Sensitive     GENTAMICIN <=1 SENSITIVE Sensitive     IMIPENEM 1 SENSITIVE Sensitive     TRIMETH/SULFA <=20 SENSITIVE Sensitive     AMPICILLIN/SULBACTAM >=32 RESISTANT Resistant     PIP/TAZO <=4 SENSITIVE Sensitive     * MODERATE MORGANELLA MORGANII   Methicillin resistant staphylococcus aureus - MIC*    CIPROFLOXACIN >=8 RESISTANT Resistant     ERYTHROMYCIN >=8 RESISTANT Resistant     GENTAMICIN <=0.5 SENSITIVE Sensitive     OXACILLIN >=4 RESISTANT Resistant     TETRACYCLINE <=1 SENSITIVE Sensitive     VANCOMYCIN <=0.5 SENSITIVE Sensitive     TRIMETH/SULFA <=10 SENSITIVE Sensitive     CLINDAMYCIN <=0.25 SENSITIVE Sensitive     RIFAMPIN <=0.5 SENSITIVE Sensitive     Inducible Clindamycin NEGATIVE Sensitive     * ABUNDANT METHICILLIN RESISTANT STAPHYLOCOCCUS AUREUS      Lab Results  Component Value Date   CALCIUM 8.5 (L) 12/25/2017   CALCIUM 8.4 (L) 12/25/2017   PHOS 4.1 12/25/2017               Impression: 1)Renal  AKI secondary to Prerenal/ATN                AKI on CKD               CKD stage 3.               CKD since 2017               CKD secondary to DM                Progression of CKD marked with AKI                Nephrolithiasis Hx Present.                AKI better        2)HTN   3)Anemia HGb at goal (9--11)  4)CKD Mineral-Bone Disorder  Phosphorus at goal. Calcium is at goal.  5)ID admitted with cellulitis Primary MD following  6)Electrolytes  Hyperkalemic Better  Hyponatremic stable  7)Acid base Co2 just at goal NON AG acidosis present    Plan:  Will continue current care     [place] S  12/25/2017, 10:10 AM

## 2017-12-26 ENCOUNTER — Encounter (INDEPENDENT_AMBULATORY_CARE_PROVIDER_SITE_OTHER): Payer: Medicare Other | Admitting: Ophthalmology

## 2017-12-26 DIAGNOSIS — E11621 Type 2 diabetes mellitus with foot ulcer: Secondary | ICD-10-CM | POA: Diagnosis not present

## 2017-12-26 DIAGNOSIS — N184 Chronic kidney disease, stage 4 (severe): Secondary | ICD-10-CM | POA: Diagnosis not present

## 2017-12-26 DIAGNOSIS — L03115 Cellulitis of right lower limb: Secondary | ICD-10-CM | POA: Diagnosis not present

## 2017-12-26 DIAGNOSIS — E782 Mixed hyperlipidemia: Secondary | ICD-10-CM | POA: Diagnosis not present

## 2017-12-26 DIAGNOSIS — E1122 Type 2 diabetes mellitus with diabetic chronic kidney disease: Secondary | ICD-10-CM | POA: Diagnosis not present

## 2017-12-26 DIAGNOSIS — E875 Hyperkalemia: Secondary | ICD-10-CM | POA: Diagnosis not present

## 2017-12-26 LAB — CULTURE, BLOOD (ROUTINE X 2)
Culture: NO GROWTH
Culture: NO GROWTH
SPECIAL REQUESTS: ADEQUATE
Special Requests: ADEQUATE

## 2018-01-13 DIAGNOSIS — D509 Iron deficiency anemia, unspecified: Secondary | ICD-10-CM | POA: Diagnosis not present

## 2018-01-13 DIAGNOSIS — Z79899 Other long term (current) drug therapy: Secondary | ICD-10-CM | POA: Diagnosis not present

## 2018-01-13 DIAGNOSIS — N183 Chronic kidney disease, stage 3 (moderate): Secondary | ICD-10-CM | POA: Diagnosis not present

## 2018-01-13 DIAGNOSIS — E559 Vitamin D deficiency, unspecified: Secondary | ICD-10-CM | POA: Diagnosis not present

## 2018-01-13 DIAGNOSIS — I10 Essential (primary) hypertension: Secondary | ICD-10-CM | POA: Diagnosis not present

## 2018-01-13 DIAGNOSIS — R809 Proteinuria, unspecified: Secondary | ICD-10-CM | POA: Diagnosis not present

## 2018-01-15 DIAGNOSIS — E875 Hyperkalemia: Secondary | ICD-10-CM | POA: Diagnosis not present

## 2018-01-15 DIAGNOSIS — N184 Chronic kidney disease, stage 4 (severe): Secondary | ICD-10-CM | POA: Diagnosis not present

## 2018-01-15 DIAGNOSIS — N2581 Secondary hyperparathyroidism of renal origin: Secondary | ICD-10-CM | POA: Diagnosis not present

## 2018-01-21 ENCOUNTER — Ambulatory Visit: Payer: Medicare Other | Admitting: General Surgery

## 2018-01-21 ENCOUNTER — Ambulatory Visit (INDEPENDENT_AMBULATORY_CARE_PROVIDER_SITE_OTHER): Payer: Self-pay | Admitting: General Surgery

## 2018-01-21 ENCOUNTER — Ambulatory Visit: Payer: Self-pay | Admitting: General Surgery

## 2018-01-21 ENCOUNTER — Encounter: Payer: Self-pay | Admitting: General Surgery

## 2018-01-21 VITALS — BP 158/55 | HR 64 | Temp 97.8°F | Resp 20 | Wt 253.0 lb

## 2018-01-21 DIAGNOSIS — B9562 Methicillin resistant Staphylococcus aureus infection as the cause of diseases classified elsewhere: Secondary | ICD-10-CM | POA: Diagnosis not present

## 2018-01-21 DIAGNOSIS — L03115 Cellulitis of right lower limb: Secondary | ICD-10-CM

## 2018-01-21 NOTE — Progress Notes (Signed)
Subjective:     Courtney Schneider  Here for hospitalization follow-up of right foot cellulitis.  She has had transmetatarsal amputation of the right foot in the past.  She did develop an ulceration along the plantar aspect of the right foot over the metatarsal head.  She has finished her antibiotic course.  She has been cleaning the wound with peroxide and a Q-tip twice a day.  She is pleased with the results.  The wound is still open.  She denies any fevers. Objective:    BP (!) 158/55 (BP Location: Left Arm, Patient Position: Sitting, Cuff Size: Large)   Pulse 64   Temp 97.8 F (36.6 C) (Temporal)   Resp 20   Wt 253 lb (114.8 kg)   BMI 43.43 kg/m   General:  alert, cooperative and no distress  Right foot with 1 cm open wound down to the subcutaneous tissue with granulation tissue noted.  Desquamation of the callus surrounding it is present.  No induration or purulent drainage present.     Assessment:    Diabetic right foot ulceration.  Cellulitis right foot resolved.    Plan:  Made just clean wound once a day.  Will refer to Dr. Posey Pronto of podiatry for further evaluation and treatment.  Follow-up here as needed.

## 2018-01-27 ENCOUNTER — Encounter (INDEPENDENT_AMBULATORY_CARE_PROVIDER_SITE_OTHER): Payer: Medicare Other | Admitting: Ophthalmology

## 2018-01-27 DIAGNOSIS — E113312 Type 2 diabetes mellitus with moderate nonproliferative diabetic retinopathy with macular edema, left eye: Secondary | ICD-10-CM

## 2018-01-27 DIAGNOSIS — H35033 Hypertensive retinopathy, bilateral: Secondary | ICD-10-CM | POA: Diagnosis not present

## 2018-01-27 DIAGNOSIS — I10 Essential (primary) hypertension: Secondary | ICD-10-CM

## 2018-01-27 DIAGNOSIS — H2513 Age-related nuclear cataract, bilateral: Secondary | ICD-10-CM | POA: Diagnosis not present

## 2018-01-27 DIAGNOSIS — E11311 Type 2 diabetes mellitus with unspecified diabetic retinopathy with macular edema: Secondary | ICD-10-CM

## 2018-01-27 DIAGNOSIS — H43813 Vitreous degeneration, bilateral: Secondary | ICD-10-CM

## 2018-01-27 DIAGNOSIS — H318 Other specified disorders of choroid: Secondary | ICD-10-CM

## 2018-01-27 DIAGNOSIS — E113391 Type 2 diabetes mellitus with moderate nonproliferative diabetic retinopathy without macular edema, right eye: Secondary | ICD-10-CM | POA: Diagnosis not present

## 2018-02-09 DIAGNOSIS — E11628 Type 2 diabetes mellitus with other skin complications: Secondary | ICD-10-CM | POA: Diagnosis not present

## 2018-02-09 DIAGNOSIS — E11621 Type 2 diabetes mellitus with foot ulcer: Secondary | ICD-10-CM | POA: Diagnosis not present

## 2018-02-09 DIAGNOSIS — I10 Essential (primary) hypertension: Secondary | ICD-10-CM | POA: Diagnosis not present

## 2018-02-09 DIAGNOSIS — E1122 Type 2 diabetes mellitus with diabetic chronic kidney disease: Secondary | ICD-10-CM | POA: Diagnosis not present

## 2018-02-09 DIAGNOSIS — E782 Mixed hyperlipidemia: Secondary | ICD-10-CM | POA: Diagnosis not present

## 2018-02-10 DIAGNOSIS — L97512 Non-pressure chronic ulcer of other part of right foot with fat layer exposed: Secondary | ICD-10-CM | POA: Diagnosis not present

## 2018-02-10 DIAGNOSIS — Z89431 Acquired absence of right foot: Secondary | ICD-10-CM | POA: Diagnosis not present

## 2018-02-14 DIAGNOSIS — I1310 Hypertensive heart and chronic kidney disease without heart failure, with stage 1 through stage 4 chronic kidney disease, or unspecified chronic kidney disease: Secondary | ICD-10-CM | POA: Diagnosis not present

## 2018-02-14 DIAGNOSIS — L89892 Pressure ulcer of other site, stage 2: Secondary | ICD-10-CM | POA: Diagnosis not present

## 2018-02-14 DIAGNOSIS — Z89421 Acquired absence of other right toe(s): Secondary | ICD-10-CM | POA: Diagnosis not present

## 2018-02-14 DIAGNOSIS — E1122 Type 2 diabetes mellitus with diabetic chronic kidney disease: Secondary | ICD-10-CM | POA: Diagnosis not present

## 2018-02-14 DIAGNOSIS — Z6841 Body Mass Index (BMI) 40.0 and over, adult: Secondary | ICD-10-CM | POA: Diagnosis not present

## 2018-02-14 DIAGNOSIS — G4700 Insomnia, unspecified: Secondary | ICD-10-CM | POA: Diagnosis not present

## 2018-02-14 DIAGNOSIS — E782 Mixed hyperlipidemia: Secondary | ICD-10-CM | POA: Diagnosis not present

## 2018-02-14 DIAGNOSIS — N184 Chronic kidney disease, stage 4 (severe): Secondary | ICD-10-CM | POA: Diagnosis not present

## 2018-02-25 DIAGNOSIS — L97519 Non-pressure chronic ulcer of other part of right foot with unspecified severity: Secondary | ICD-10-CM | POA: Diagnosis not present

## 2018-02-25 DIAGNOSIS — E11621 Type 2 diabetes mellitus with foot ulcer: Secondary | ICD-10-CM | POA: Diagnosis not present

## 2018-02-25 DIAGNOSIS — Z89431 Acquired absence of right foot: Secondary | ICD-10-CM | POA: Diagnosis not present

## 2018-02-25 DIAGNOSIS — L97513 Non-pressure chronic ulcer of other part of right foot with necrosis of muscle: Secondary | ICD-10-CM | POA: Diagnosis not present

## 2018-03-03 DIAGNOSIS — L97509 Non-pressure chronic ulcer of other part of unspecified foot with unspecified severity: Secondary | ICD-10-CM | POA: Diagnosis not present

## 2018-03-03 DIAGNOSIS — E785 Hyperlipidemia, unspecified: Secondary | ICD-10-CM | POA: Diagnosis not present

## 2018-03-03 DIAGNOSIS — E11621 Type 2 diabetes mellitus with foot ulcer: Secondary | ICD-10-CM | POA: Diagnosis not present

## 2018-03-03 DIAGNOSIS — R9389 Abnormal findings on diagnostic imaging of other specified body structures: Secondary | ICD-10-CM | POA: Diagnosis not present

## 2018-03-04 DIAGNOSIS — T8789 Other complications of amputation stump: Secondary | ICD-10-CM | POA: Diagnosis not present

## 2018-03-04 DIAGNOSIS — E11621 Type 2 diabetes mellitus with foot ulcer: Secondary | ICD-10-CM | POA: Diagnosis not present

## 2018-03-04 DIAGNOSIS — I739 Peripheral vascular disease, unspecified: Secondary | ICD-10-CM | POA: Diagnosis not present

## 2018-03-04 DIAGNOSIS — L97519 Non-pressure chronic ulcer of other part of right foot with unspecified severity: Secondary | ICD-10-CM | POA: Diagnosis not present

## 2018-03-04 DIAGNOSIS — L97518 Non-pressure chronic ulcer of other part of right foot with other specified severity: Secondary | ICD-10-CM | POA: Diagnosis not present

## 2018-03-18 DIAGNOSIS — L97518 Non-pressure chronic ulcer of other part of right foot with other specified severity: Secondary | ICD-10-CM | POA: Diagnosis not present

## 2018-03-18 DIAGNOSIS — I739 Peripheral vascular disease, unspecified: Secondary | ICD-10-CM | POA: Diagnosis not present

## 2018-03-18 DIAGNOSIS — L97519 Non-pressure chronic ulcer of other part of right foot with unspecified severity: Secondary | ICD-10-CM | POA: Diagnosis not present

## 2018-03-18 DIAGNOSIS — E11621 Type 2 diabetes mellitus with foot ulcer: Secondary | ICD-10-CM | POA: Diagnosis not present

## 2018-03-18 DIAGNOSIS — T8789 Other complications of amputation stump: Secondary | ICD-10-CM | POA: Diagnosis not present

## 2018-03-24 ENCOUNTER — Encounter: Payer: Self-pay | Admitting: Vascular Surgery

## 2018-03-24 ENCOUNTER — Encounter (INDEPENDENT_AMBULATORY_CARE_PROVIDER_SITE_OTHER): Payer: Medicare Other | Admitting: Ophthalmology

## 2018-03-24 ENCOUNTER — Ambulatory Visit (INDEPENDENT_AMBULATORY_CARE_PROVIDER_SITE_OTHER): Payer: Medicare Other | Admitting: Vascular Surgery

## 2018-03-24 DIAGNOSIS — I739 Peripheral vascular disease, unspecified: Secondary | ICD-10-CM | POA: Diagnosis not present

## 2018-03-24 DIAGNOSIS — H318 Other specified disorders of choroid: Secondary | ICD-10-CM

## 2018-03-24 DIAGNOSIS — E113391 Type 2 diabetes mellitus with moderate nonproliferative diabetic retinopathy without macular edema, right eye: Secondary | ICD-10-CM | POA: Diagnosis not present

## 2018-03-24 DIAGNOSIS — E113312 Type 2 diabetes mellitus with moderate nonproliferative diabetic retinopathy with macular edema, left eye: Secondary | ICD-10-CM | POA: Diagnosis not present

## 2018-03-24 DIAGNOSIS — I10 Essential (primary) hypertension: Secondary | ICD-10-CM | POA: Diagnosis not present

## 2018-03-24 DIAGNOSIS — E11311 Type 2 diabetes mellitus with unspecified diabetic retinopathy with macular edema: Secondary | ICD-10-CM | POA: Diagnosis not present

## 2018-03-24 DIAGNOSIS — H35033 Hypertensive retinopathy, bilateral: Secondary | ICD-10-CM

## 2018-03-24 DIAGNOSIS — H43813 Vitreous degeneration, bilateral: Secondary | ICD-10-CM

## 2018-03-24 DIAGNOSIS — H2513 Age-related nuclear cataract, bilateral: Secondary | ICD-10-CM

## 2018-03-24 NOTE — Progress Notes (Signed)
Vascular and Vein Specialist of [facility]  Patient name: Courtney Schneider MRN: [id number] DOB: 31-Oct-1945 Sex: female  REASON FOR CONSULT: Valuation of arterial flow to left foot  Seen today in our [place] office  HPI: Courtney Schneider is a 72 y.o. female, who is here for evaluation of arterial flow to her right foot.  He is here today with her daughter.  Does have a history of right transmetatarsal amputation several years ago and had healing with no difficulty.  She is a long-standing diabetic.  He is now developed a ulceration away from the transmetatarsal incision on the plantar aspect under the metatarsal bones.  She is being seen in the [facility] wound center and is here today for discussion of vascular flow.  He denies any claudication type symptoms and is never had any wound issues on her left foot.  Past Medical History:  Diagnosis Date  . Diabetes mellitus without complication ([place])   . Hypertension     History reviewed. No pertinent family history.  SOCIAL HISTORY: Social History   Socioeconomic History  . Marital status: Married    Spouse name: Not on file  . Number of children: Not on file  . Years of education: Not on file  . Highest education level: Not on file  Occupational History  . Not on file  Social Needs  . Financial resource strain: Not on file  . Food insecurity:    Worry: Not on file    Inability: Not on file  . Transportation needs:    Medical: Not on file    Non-medical: Not on file  Tobacco Use  . Smoking status: Never Smoker  . Smokeless tobacco: Never Used  Substance and Sexual Activity  . Alcohol use: No  . Drug use: No  . Sexual activity: Not on file  Lifestyle  . Physical activity:    Days per week: Not on file    Minutes per session: Not on file  . Stress: Not on file  Relationships  . Social connections:    Talks on phone: Not on file    Gets together: Not on file    Attends religious  service: Not on file    Active member of club or organization: Not on file    Attends meetings of clubs or organizations: Not on file    Relationship status: Not on file  . Intimate partner violence:    Fear of current or ex partner: Not on file    Emotionally abused: Not on file    Physically abused: Not on file    Forced sexual activity: Not on file  Other Topics Concern  . Not on file  Social History Narrative  . Not on file    No Known Allergies  Current Outpatient Medications  Medication Sig Dispense Refill  . amLODipine-valsartan (EXFORGE) 10-160 MG tablet Take 1 tablet by mouth daily. Resume on 5/31    . aspirin EC 81 MG tablet Take 81 mg by mouth daily.    . ciprofloxacin (CIPRO) 500 MG tablet Take 500 mg by mouth 2 (two) times daily.    . furosemide (LASIX) 40 MG tablet Take 40 mg by mouth.    . insulin glargine (LANTUS) 100 UNIT/ML injection Inject 26 Units into the skin at bedtime.     . metoprolol (LOPRESSOR) 50 MG tablet Take 50 mg by mouth daily.    . Multiple Vitamins-Minerals (PRESERVISION AREDS PO) Take 1 tablet by mouth daily.    Marland Kitchen  pioglitazone (ACTOS) 15 MG tablet Take 15 mg by mouth daily.    . pravastatin (PRAVACHOL) 40 MG tablet Take 40 mg by mouth daily.     No current facility-administered medications for this visit.     REVIEW OF SYSTEMS:  [X]  denotes positive finding, [ ]  denotes negative finding Cardiac  Comments:  Chest pain or chest pressure:    Shortness of breath upon exertion:    Short of breath when lying flat:    Irregular heart rhythm:        Vascular    Pain in calf, thigh, or hip brought on by ambulation:    Pain in feet at night that wakes you up from your sleep:     Blood clot in your veins:    Leg swelling:  x       Pulmonary    Oxygen at home:    Productive cough:     Wheezing:         Neurologic    Sudden weakness in arms or legs:     Sudden numbness in arms or legs:     Sudden onset of difficulty speaking or slurred  speech:    Temporary loss of vision in one eye:     Problems with dizziness:         Gastrointestinal    Blood in stool:     Vomited blood:         Genitourinary    Burning when urinating:     Blood in urine:        Psychiatric    Major depression:         Hematologic    Bleeding problems:    Problems with blood clotting too easily:        Skin    Rashes or ulcers:        Constitutional    Fever or chills:      PHYSICAL EXAM: There were no vitals filed for this visit.  GENERAL: The patient is a well-nourished female, in no acute distress. The vital signs are documented above. CARDIOVASCULAR: Have moderate bilateral lower extremity swelling.  I do not palpate popliteal pulses bilaterally.  Not palpate pedal pulses.  By hand-held Doppler she does have biphasic flow at the dorsalis pedis and posterior tibial level at her ankle. PULMONARY: There is good air exchange  ABDOMEN: Soft and non-tender  MUSCULOSKELETAL: There are no major deformities or cyanosis. NEUROLOGIC: No focal weakness or paresthesias are detected. SKIN: A 1 to 1-1/2 cm ulceration present with no surrounding erythema on the plantar aspect of her right foot proximal to the transmetatarsal incision line no surrounding erythema PSYCHIATRIC: The patient has a normal affect.  DATA:  Invasive studies 03/03/2018 were reviewed.  This shows noncompressible vessels due to calcification therefore ankle arm index is unreliable.  She does have biphasic waveforms at the dorsalis pedis and posterior tibial bilaterally.  She has normal digital waveforms in her left foot.  MEDICAL ISSUES: I had a long discussion with the patient and her daughter present.  I do not feel that she has arterial insufficiency causing any difficulty with healing.  I did explain the very difficult time of healing these types of wounds despite adequate arterial flow due to pressure over this portion of her transmetatarsal amputation.  She will continue to  follow-up with Dr.Cathey sooner.  I did explain that she is certainly at risk for below-knee amputation.   Rosetta Posner, MD FACS Vascular and Vein  Specialists of [facility] Tel [phone] Pager [phone]

## 2018-03-25 ENCOUNTER — Encounter: Payer: Self-pay | Admitting: Surgery

## 2018-04-02 DIAGNOSIS — I10 Essential (primary) hypertension: Secondary | ICD-10-CM | POA: Diagnosis not present

## 2018-04-02 DIAGNOSIS — E559 Vitamin D deficiency, unspecified: Secondary | ICD-10-CM | POA: Diagnosis not present

## 2018-04-02 DIAGNOSIS — N183 Chronic kidney disease, stage 3 (moderate): Secondary | ICD-10-CM | POA: Diagnosis not present

## 2018-04-02 DIAGNOSIS — D509 Iron deficiency anemia, unspecified: Secondary | ICD-10-CM | POA: Diagnosis not present

## 2018-04-02 DIAGNOSIS — R809 Proteinuria, unspecified: Secondary | ICD-10-CM | POA: Diagnosis not present

## 2018-04-08 DIAGNOSIS — L97519 Non-pressure chronic ulcer of other part of right foot with unspecified severity: Secondary | ICD-10-CM | POA: Diagnosis not present

## 2018-04-08 DIAGNOSIS — Z9889 Other specified postprocedural states: Secondary | ICD-10-CM | POA: Diagnosis not present

## 2018-04-08 DIAGNOSIS — E11621 Type 2 diabetes mellitus with foot ulcer: Secondary | ICD-10-CM | POA: Diagnosis not present

## 2018-04-09 DIAGNOSIS — N179 Acute kidney failure, unspecified: Secondary | ICD-10-CM | POA: Diagnosis not present

## 2018-04-09 DIAGNOSIS — N184 Chronic kidney disease, stage 4 (severe): Secondary | ICD-10-CM | POA: Diagnosis not present

## 2018-04-09 DIAGNOSIS — R809 Proteinuria, unspecified: Secondary | ICD-10-CM | POA: Diagnosis not present

## 2018-04-09 DIAGNOSIS — N2581 Secondary hyperparathyroidism of renal origin: Secondary | ICD-10-CM | POA: Diagnosis not present

## 2018-04-09 DIAGNOSIS — E559 Vitamin D deficiency, unspecified: Secondary | ICD-10-CM | POA: Diagnosis not present

## 2018-04-11 DIAGNOSIS — L97519 Non-pressure chronic ulcer of other part of right foot with unspecified severity: Secondary | ICD-10-CM | POA: Diagnosis not present

## 2018-04-11 DIAGNOSIS — Z9889 Other specified postprocedural states: Secondary | ICD-10-CM | POA: Diagnosis not present

## 2018-04-11 DIAGNOSIS — E11621 Type 2 diabetes mellitus with foot ulcer: Secondary | ICD-10-CM | POA: Diagnosis not present

## 2018-04-18 DIAGNOSIS — Z9889 Other specified postprocedural states: Secondary | ICD-10-CM | POA: Diagnosis not present

## 2018-04-18 DIAGNOSIS — E11621 Type 2 diabetes mellitus with foot ulcer: Secondary | ICD-10-CM | POA: Diagnosis not present

## 2018-04-18 DIAGNOSIS — L97512 Non-pressure chronic ulcer of other part of right foot with fat layer exposed: Secondary | ICD-10-CM | POA: Diagnosis not present

## 2018-04-18 DIAGNOSIS — L97519 Non-pressure chronic ulcer of other part of right foot with unspecified severity: Secondary | ICD-10-CM | POA: Diagnosis not present

## 2018-04-25 DIAGNOSIS — Z9889 Other specified postprocedural states: Secondary | ICD-10-CM | POA: Diagnosis not present

## 2018-04-25 DIAGNOSIS — L97519 Non-pressure chronic ulcer of other part of right foot with unspecified severity: Secondary | ICD-10-CM | POA: Diagnosis not present

## 2018-04-25 DIAGNOSIS — E11621 Type 2 diabetes mellitus with foot ulcer: Secondary | ICD-10-CM | POA: Diagnosis not present

## 2018-04-28 ENCOUNTER — Encounter (INDEPENDENT_AMBULATORY_CARE_PROVIDER_SITE_OTHER): Payer: Medicare Other | Admitting: Ophthalmology

## 2018-05-01 ENCOUNTER — Encounter (INDEPENDENT_AMBULATORY_CARE_PROVIDER_SITE_OTHER): Payer: Medicare Other | Admitting: Ophthalmology

## 2018-05-01 DIAGNOSIS — E113393 Type 2 diabetes mellitus with moderate nonproliferative diabetic retinopathy without macular edema, bilateral: Secondary | ICD-10-CM

## 2018-05-01 DIAGNOSIS — H318 Other specified disorders of choroid: Secondary | ICD-10-CM

## 2018-05-01 DIAGNOSIS — H2513 Age-related nuclear cataract, bilateral: Secondary | ICD-10-CM

## 2018-05-01 DIAGNOSIS — H43813 Vitreous degeneration, bilateral: Secondary | ICD-10-CM

## 2018-05-01 DIAGNOSIS — I10 Essential (primary) hypertension: Secondary | ICD-10-CM | POA: Diagnosis not present

## 2018-05-01 DIAGNOSIS — H35033 Hypertensive retinopathy, bilateral: Secondary | ICD-10-CM

## 2018-05-01 DIAGNOSIS — E11319 Type 2 diabetes mellitus with unspecified diabetic retinopathy without macular edema: Secondary | ICD-10-CM | POA: Diagnosis not present

## 2018-05-02 DIAGNOSIS — L97518 Non-pressure chronic ulcer of other part of right foot with other specified severity: Secondary | ICD-10-CM | POA: Diagnosis not present

## 2018-05-02 DIAGNOSIS — L97528 Non-pressure chronic ulcer of other part of left foot with other specified severity: Secondary | ICD-10-CM | POA: Diagnosis not present

## 2018-05-02 DIAGNOSIS — L97512 Non-pressure chronic ulcer of other part of right foot with fat layer exposed: Secondary | ICD-10-CM | POA: Diagnosis not present

## 2018-05-02 DIAGNOSIS — E11621 Type 2 diabetes mellitus with foot ulcer: Secondary | ICD-10-CM | POA: Diagnosis not present

## 2018-05-09 DIAGNOSIS — L97518 Non-pressure chronic ulcer of other part of right foot with other specified severity: Secondary | ICD-10-CM | POA: Diagnosis not present

## 2018-05-09 DIAGNOSIS — E11621 Type 2 diabetes mellitus with foot ulcer: Secondary | ICD-10-CM | POA: Diagnosis not present

## 2018-05-09 DIAGNOSIS — L97522 Non-pressure chronic ulcer of other part of left foot with fat layer exposed: Secondary | ICD-10-CM | POA: Diagnosis not present

## 2018-05-09 DIAGNOSIS — L97528 Non-pressure chronic ulcer of other part of left foot with other specified severity: Secondary | ICD-10-CM | POA: Diagnosis not present

## 2018-05-13 DIAGNOSIS — L97528 Non-pressure chronic ulcer of other part of left foot with other specified severity: Secondary | ICD-10-CM | POA: Diagnosis not present

## 2018-05-13 DIAGNOSIS — L97519 Non-pressure chronic ulcer of other part of right foot with unspecified severity: Secondary | ICD-10-CM | POA: Diagnosis not present

## 2018-05-13 DIAGNOSIS — E11621 Type 2 diabetes mellitus with foot ulcer: Secondary | ICD-10-CM | POA: Diagnosis not present

## 2018-05-13 DIAGNOSIS — L97518 Non-pressure chronic ulcer of other part of right foot with other specified severity: Secondary | ICD-10-CM | POA: Diagnosis not present

## 2018-05-15 DIAGNOSIS — N184 Chronic kidney disease, stage 4 (severe): Secondary | ICD-10-CM | POA: Diagnosis not present

## 2018-05-15 DIAGNOSIS — L98498 Non-pressure chronic ulcer of skin of other sites with other specified severity: Secondary | ICD-10-CM | POA: Diagnosis not present

## 2018-05-15 DIAGNOSIS — E875 Hyperkalemia: Secondary | ICD-10-CM | POA: Diagnosis not present

## 2018-05-15 DIAGNOSIS — L89892 Pressure ulcer of other site, stage 2: Secondary | ICD-10-CM | POA: Diagnosis not present

## 2018-05-15 DIAGNOSIS — L03115 Cellulitis of right lower limb: Secondary | ICD-10-CM | POA: Diagnosis not present

## 2018-05-15 DIAGNOSIS — E11628 Type 2 diabetes mellitus with other skin complications: Secondary | ICD-10-CM | POA: Diagnosis not present

## 2018-05-15 DIAGNOSIS — E11621 Type 2 diabetes mellitus with foot ulcer: Secondary | ICD-10-CM | POA: Diagnosis not present

## 2018-05-15 DIAGNOSIS — Z6841 Body Mass Index (BMI) 40.0 and over, adult: Secondary | ICD-10-CM | POA: Diagnosis not present

## 2018-05-15 DIAGNOSIS — I10 Essential (primary) hypertension: Secondary | ICD-10-CM | POA: Diagnosis not present

## 2018-05-15 DIAGNOSIS — F5101 Primary insomnia: Secondary | ICD-10-CM | POA: Diagnosis not present

## 2018-05-15 DIAGNOSIS — E782 Mixed hyperlipidemia: Secondary | ICD-10-CM | POA: Diagnosis not present

## 2018-05-15 DIAGNOSIS — E1122 Type 2 diabetes mellitus with diabetic chronic kidney disease: Secondary | ICD-10-CM | POA: Diagnosis not present

## 2018-05-20 DIAGNOSIS — L97522 Non-pressure chronic ulcer of other part of left foot with fat layer exposed: Secondary | ICD-10-CM | POA: Diagnosis not present

## 2018-05-20 DIAGNOSIS — L97523 Non-pressure chronic ulcer of other part of left foot with necrosis of muscle: Secondary | ICD-10-CM | POA: Diagnosis not present

## 2018-05-20 DIAGNOSIS — L97528 Non-pressure chronic ulcer of other part of left foot with other specified severity: Secondary | ICD-10-CM | POA: Diagnosis not present

## 2018-05-20 DIAGNOSIS — E11621 Type 2 diabetes mellitus with foot ulcer: Secondary | ICD-10-CM | POA: Diagnosis not present

## 2018-05-20 DIAGNOSIS — L97518 Non-pressure chronic ulcer of other part of right foot with other specified severity: Secondary | ICD-10-CM | POA: Diagnosis not present

## 2018-05-21 DIAGNOSIS — L97529 Non-pressure chronic ulcer of other part of left foot with unspecified severity: Secondary | ICD-10-CM | POA: Diagnosis not present

## 2018-05-21 DIAGNOSIS — E11621 Type 2 diabetes mellitus with foot ulcer: Secondary | ICD-10-CM | POA: Diagnosis not present

## 2018-05-21 DIAGNOSIS — L97509 Non-pressure chronic ulcer of other part of unspecified foot with unspecified severity: Secondary | ICD-10-CM | POA: Diagnosis not present

## 2018-05-27 DIAGNOSIS — L97522 Non-pressure chronic ulcer of other part of left foot with fat layer exposed: Secondary | ICD-10-CM | POA: Diagnosis not present

## 2018-05-27 DIAGNOSIS — L97518 Non-pressure chronic ulcer of other part of right foot with other specified severity: Secondary | ICD-10-CM | POA: Diagnosis not present

## 2018-05-27 DIAGNOSIS — E11621 Type 2 diabetes mellitus with foot ulcer: Secondary | ICD-10-CM | POA: Diagnosis not present

## 2018-05-27 DIAGNOSIS — L97512 Non-pressure chronic ulcer of other part of right foot with fat layer exposed: Secondary | ICD-10-CM | POA: Diagnosis not present

## 2018-05-27 DIAGNOSIS — L97528 Non-pressure chronic ulcer of other part of left foot with other specified severity: Secondary | ICD-10-CM | POA: Diagnosis not present

## 2018-05-29 ENCOUNTER — Encounter (INDEPENDENT_AMBULATORY_CARE_PROVIDER_SITE_OTHER): Payer: Medicare Other | Admitting: Ophthalmology

## 2018-05-29 DIAGNOSIS — E113393 Type 2 diabetes mellitus with moderate nonproliferative diabetic retinopathy without macular edema, bilateral: Secondary | ICD-10-CM

## 2018-05-29 DIAGNOSIS — E11319 Type 2 diabetes mellitus with unspecified diabetic retinopathy without macular edema: Secondary | ICD-10-CM | POA: Diagnosis not present

## 2018-05-29 DIAGNOSIS — H35033 Hypertensive retinopathy, bilateral: Secondary | ICD-10-CM | POA: Diagnosis not present

## 2018-05-29 DIAGNOSIS — H2513 Age-related nuclear cataract, bilateral: Secondary | ICD-10-CM | POA: Diagnosis not present

## 2018-05-29 DIAGNOSIS — H318 Other specified disorders of choroid: Secondary | ICD-10-CM | POA: Diagnosis not present

## 2018-05-29 DIAGNOSIS — I10 Essential (primary) hypertension: Secondary | ICD-10-CM | POA: Diagnosis not present

## 2018-05-29 DIAGNOSIS — H43813 Vitreous degeneration, bilateral: Secondary | ICD-10-CM

## 2018-06-03 DIAGNOSIS — L97518 Non-pressure chronic ulcer of other part of right foot with other specified severity: Secondary | ICD-10-CM | POA: Diagnosis not present

## 2018-06-03 DIAGNOSIS — Z89421 Acquired absence of other right toe(s): Secondary | ICD-10-CM | POA: Diagnosis not present

## 2018-06-03 DIAGNOSIS — L97528 Non-pressure chronic ulcer of other part of left foot with other specified severity: Secondary | ICD-10-CM | POA: Diagnosis not present

## 2018-06-03 DIAGNOSIS — L97519 Non-pressure chronic ulcer of other part of right foot with unspecified severity: Secondary | ICD-10-CM | POA: Diagnosis not present

## 2018-06-03 DIAGNOSIS — E11621 Type 2 diabetes mellitus with foot ulcer: Secondary | ICD-10-CM | POA: Diagnosis not present

## 2018-06-09 DIAGNOSIS — E11628 Type 2 diabetes mellitus with other skin complications: Secondary | ICD-10-CM | POA: Diagnosis not present

## 2018-06-09 DIAGNOSIS — E1122 Type 2 diabetes mellitus with diabetic chronic kidney disease: Secondary | ICD-10-CM | POA: Diagnosis not present

## 2018-06-09 DIAGNOSIS — I10 Essential (primary) hypertension: Secondary | ICD-10-CM | POA: Diagnosis not present

## 2018-06-09 DIAGNOSIS — E782 Mixed hyperlipidemia: Secondary | ICD-10-CM | POA: Diagnosis not present

## 2018-06-10 DIAGNOSIS — L97512 Non-pressure chronic ulcer of other part of right foot with fat layer exposed: Secondary | ICD-10-CM | POA: Diagnosis not present

## 2018-06-10 DIAGNOSIS — L97522 Non-pressure chronic ulcer of other part of left foot with fat layer exposed: Secondary | ICD-10-CM | POA: Diagnosis not present

## 2018-06-10 DIAGNOSIS — L97518 Non-pressure chronic ulcer of other part of right foot with other specified severity: Secondary | ICD-10-CM | POA: Diagnosis not present

## 2018-06-10 DIAGNOSIS — Z89421 Acquired absence of other right toe(s): Secondary | ICD-10-CM | POA: Diagnosis not present

## 2018-06-10 DIAGNOSIS — E11621 Type 2 diabetes mellitus with foot ulcer: Secondary | ICD-10-CM | POA: Diagnosis not present

## 2018-06-10 DIAGNOSIS — L97528 Non-pressure chronic ulcer of other part of left foot with other specified severity: Secondary | ICD-10-CM | POA: Diagnosis not present

## 2018-06-13 DIAGNOSIS — I129 Hypertensive chronic kidney disease with stage 1 through stage 4 chronic kidney disease, or unspecified chronic kidney disease: Secondary | ICD-10-CM | POA: Diagnosis not present

## 2018-06-13 DIAGNOSIS — Z89421 Acquired absence of other right toe(s): Secondary | ICD-10-CM | POA: Diagnosis not present

## 2018-06-13 DIAGNOSIS — N184 Chronic kidney disease, stage 4 (severe): Secondary | ICD-10-CM | POA: Diagnosis not present

## 2018-06-13 DIAGNOSIS — L89612 Pressure ulcer of right heel, stage 2: Secondary | ICD-10-CM | POA: Diagnosis not present

## 2018-06-13 DIAGNOSIS — E1122 Type 2 diabetes mellitus with diabetic chronic kidney disease: Secondary | ICD-10-CM | POA: Diagnosis not present

## 2018-06-13 DIAGNOSIS — I10 Essential (primary) hypertension: Secondary | ICD-10-CM | POA: Diagnosis not present

## 2018-06-13 DIAGNOSIS — E11621 Type 2 diabetes mellitus with foot ulcer: Secondary | ICD-10-CM | POA: Diagnosis not present

## 2018-06-13 DIAGNOSIS — E782 Mixed hyperlipidemia: Secondary | ICD-10-CM | POA: Diagnosis not present

## 2018-06-13 DIAGNOSIS — G4700 Insomnia, unspecified: Secondary | ICD-10-CM | POA: Diagnosis not present

## 2018-06-13 DIAGNOSIS — E875 Hyperkalemia: Secondary | ICD-10-CM | POA: Diagnosis not present

## 2018-06-17 ENCOUNTER — Other Ambulatory Visit: Payer: Self-pay

## 2018-06-17 DIAGNOSIS — L97519 Non-pressure chronic ulcer of other part of right foot with unspecified severity: Secondary | ICD-10-CM | POA: Diagnosis not present

## 2018-06-17 DIAGNOSIS — Z89421 Acquired absence of other right toe(s): Secondary | ICD-10-CM | POA: Diagnosis not present

## 2018-06-17 DIAGNOSIS — L97528 Non-pressure chronic ulcer of other part of left foot with other specified severity: Secondary | ICD-10-CM | POA: Diagnosis not present

## 2018-06-17 DIAGNOSIS — L97529 Non-pressure chronic ulcer of other part of left foot with unspecified severity: Secondary | ICD-10-CM | POA: Diagnosis not present

## 2018-06-17 DIAGNOSIS — E11621 Type 2 diabetes mellitus with foot ulcer: Secondary | ICD-10-CM | POA: Diagnosis not present

## 2018-06-17 DIAGNOSIS — L97518 Non-pressure chronic ulcer of other part of right foot with other specified severity: Secondary | ICD-10-CM | POA: Diagnosis not present

## 2018-06-30 DIAGNOSIS — Z79899 Other long term (current) drug therapy: Secondary | ICD-10-CM | POA: Diagnosis not present

## 2018-06-30 DIAGNOSIS — I10 Essential (primary) hypertension: Secondary | ICD-10-CM | POA: Diagnosis not present

## 2018-06-30 DIAGNOSIS — D509 Iron deficiency anemia, unspecified: Secondary | ICD-10-CM | POA: Diagnosis not present

## 2018-06-30 DIAGNOSIS — R809 Proteinuria, unspecified: Secondary | ICD-10-CM | POA: Diagnosis not present

## 2018-06-30 DIAGNOSIS — N183 Chronic kidney disease, stage 3 (moderate): Secondary | ICD-10-CM | POA: Diagnosis not present

## 2018-06-30 DIAGNOSIS — E559 Vitamin D deficiency, unspecified: Secondary | ICD-10-CM | POA: Diagnosis not present

## 2018-07-01 ENCOUNTER — Encounter (INDEPENDENT_AMBULATORY_CARE_PROVIDER_SITE_OTHER): Payer: Medicare Other | Admitting: Ophthalmology

## 2018-07-01 DIAGNOSIS — E113391 Type 2 diabetes mellitus with moderate nonproliferative diabetic retinopathy without macular edema, right eye: Secondary | ICD-10-CM | POA: Diagnosis not present

## 2018-07-01 DIAGNOSIS — H35033 Hypertensive retinopathy, bilateral: Secondary | ICD-10-CM

## 2018-07-01 DIAGNOSIS — H2513 Age-related nuclear cataract, bilateral: Secondary | ICD-10-CM

## 2018-07-01 DIAGNOSIS — E113312 Type 2 diabetes mellitus with moderate nonproliferative diabetic retinopathy with macular edema, left eye: Secondary | ICD-10-CM

## 2018-07-01 DIAGNOSIS — E11311 Type 2 diabetes mellitus with unspecified diabetic retinopathy with macular edema: Secondary | ICD-10-CM

## 2018-07-01 DIAGNOSIS — I10 Essential (primary) hypertension: Secondary | ICD-10-CM

## 2018-07-01 DIAGNOSIS — H43813 Vitreous degeneration, bilateral: Secondary | ICD-10-CM | POA: Diagnosis not present

## 2018-07-01 DIAGNOSIS — H318 Other specified disorders of choroid: Secondary | ICD-10-CM

## 2018-07-02 ENCOUNTER — Encounter (INDEPENDENT_AMBULATORY_CARE_PROVIDER_SITE_OTHER): Payer: Medicare Other | Admitting: Ophthalmology

## 2018-07-02 DIAGNOSIS — E875 Hyperkalemia: Secondary | ICD-10-CM | POA: Diagnosis not present

## 2018-07-02 DIAGNOSIS — R809 Proteinuria, unspecified: Secondary | ICD-10-CM | POA: Diagnosis not present

## 2018-07-02 DIAGNOSIS — I509 Heart failure, unspecified: Secondary | ICD-10-CM | POA: Diagnosis not present

## 2018-07-02 DIAGNOSIS — E1129 Type 2 diabetes mellitus with other diabetic kidney complication: Secondary | ICD-10-CM | POA: Diagnosis not present

## 2018-07-02 DIAGNOSIS — N184 Chronic kidney disease, stage 4 (severe): Secondary | ICD-10-CM | POA: Diagnosis not present

## 2018-07-02 DIAGNOSIS — I10 Essential (primary) hypertension: Secondary | ICD-10-CM | POA: Diagnosis not present

## 2018-07-15 DIAGNOSIS — L97528 Non-pressure chronic ulcer of other part of left foot with other specified severity: Secondary | ICD-10-CM | POA: Diagnosis not present

## 2018-07-15 DIAGNOSIS — E11621 Type 2 diabetes mellitus with foot ulcer: Secondary | ICD-10-CM | POA: Diagnosis not present

## 2018-07-15 DIAGNOSIS — I739 Peripheral vascular disease, unspecified: Secondary | ICD-10-CM | POA: Diagnosis not present

## 2018-07-15 DIAGNOSIS — E669 Obesity, unspecified: Secondary | ICD-10-CM | POA: Diagnosis not present

## 2018-07-25 DIAGNOSIS — L97528 Non-pressure chronic ulcer of other part of left foot with other specified severity: Secondary | ICD-10-CM | POA: Diagnosis not present

## 2018-07-25 DIAGNOSIS — I739 Peripheral vascular disease, unspecified: Secondary | ICD-10-CM | POA: Diagnosis not present

## 2018-07-25 DIAGNOSIS — E669 Obesity, unspecified: Secondary | ICD-10-CM | POA: Diagnosis not present

## 2018-07-25 DIAGNOSIS — E11621 Type 2 diabetes mellitus with foot ulcer: Secondary | ICD-10-CM | POA: Diagnosis not present

## 2018-08-01 DIAGNOSIS — L97522 Non-pressure chronic ulcer of other part of left foot with fat layer exposed: Secondary | ICD-10-CM | POA: Diagnosis not present

## 2018-08-01 DIAGNOSIS — E11621 Type 2 diabetes mellitus with foot ulcer: Secondary | ICD-10-CM | POA: Diagnosis not present

## 2018-08-01 DIAGNOSIS — L97528 Non-pressure chronic ulcer of other part of left foot with other specified severity: Secondary | ICD-10-CM | POA: Diagnosis not present

## 2018-08-01 DIAGNOSIS — L97518 Non-pressure chronic ulcer of other part of right foot with other specified severity: Secondary | ICD-10-CM | POA: Diagnosis not present

## 2018-08-01 DIAGNOSIS — I739 Peripheral vascular disease, unspecified: Secondary | ICD-10-CM | POA: Diagnosis not present

## 2018-08-01 DIAGNOSIS — Z89431 Acquired absence of right foot: Secondary | ICD-10-CM | POA: Diagnosis not present

## 2018-08-05 ENCOUNTER — Encounter (INDEPENDENT_AMBULATORY_CARE_PROVIDER_SITE_OTHER): Payer: Medicare Other | Admitting: Ophthalmology

## 2018-08-08 DIAGNOSIS — I739 Peripheral vascular disease, unspecified: Secondary | ICD-10-CM | POA: Diagnosis not present

## 2018-08-08 DIAGNOSIS — L97519 Non-pressure chronic ulcer of other part of right foot with unspecified severity: Secondary | ICD-10-CM | POA: Diagnosis not present

## 2018-08-08 DIAGNOSIS — L97529 Non-pressure chronic ulcer of other part of left foot with unspecified severity: Secondary | ICD-10-CM | POA: Diagnosis not present

## 2018-08-08 DIAGNOSIS — E11621 Type 2 diabetes mellitus with foot ulcer: Secondary | ICD-10-CM | POA: Diagnosis not present

## 2018-08-08 DIAGNOSIS — Z89431 Acquired absence of right foot: Secondary | ICD-10-CM | POA: Diagnosis not present

## 2018-08-08 DIAGNOSIS — L97528 Non-pressure chronic ulcer of other part of left foot with other specified severity: Secondary | ICD-10-CM | POA: Diagnosis not present

## 2018-08-08 DIAGNOSIS — L97518 Non-pressure chronic ulcer of other part of right foot with other specified severity: Secondary | ICD-10-CM | POA: Diagnosis not present

## 2018-08-15 DIAGNOSIS — I739 Peripheral vascular disease, unspecified: Secondary | ICD-10-CM | POA: Diagnosis not present

## 2018-08-15 DIAGNOSIS — L97518 Non-pressure chronic ulcer of other part of right foot with other specified severity: Secondary | ICD-10-CM | POA: Diagnosis not present

## 2018-08-15 DIAGNOSIS — L97528 Non-pressure chronic ulcer of other part of left foot with other specified severity: Secondary | ICD-10-CM | POA: Diagnosis not present

## 2018-08-15 DIAGNOSIS — Z89431 Acquired absence of right foot: Secondary | ICD-10-CM | POA: Diagnosis not present

## 2018-08-15 DIAGNOSIS — L97522 Non-pressure chronic ulcer of other part of left foot with fat layer exposed: Secondary | ICD-10-CM | POA: Diagnosis not present

## 2018-08-15 DIAGNOSIS — E11621 Type 2 diabetes mellitus with foot ulcer: Secondary | ICD-10-CM | POA: Diagnosis not present

## 2018-08-22 DIAGNOSIS — L97528 Non-pressure chronic ulcer of other part of left foot with other specified severity: Secondary | ICD-10-CM | POA: Diagnosis not present

## 2018-08-22 DIAGNOSIS — Z89431 Acquired absence of right foot: Secondary | ICD-10-CM | POA: Diagnosis not present

## 2018-08-22 DIAGNOSIS — L97522 Non-pressure chronic ulcer of other part of left foot with fat layer exposed: Secondary | ICD-10-CM | POA: Diagnosis not present

## 2018-08-22 DIAGNOSIS — E11621 Type 2 diabetes mellitus with foot ulcer: Secondary | ICD-10-CM | POA: Diagnosis not present

## 2018-08-22 DIAGNOSIS — L97518 Non-pressure chronic ulcer of other part of right foot with other specified severity: Secondary | ICD-10-CM | POA: Diagnosis not present

## 2018-08-22 DIAGNOSIS — I739 Peripheral vascular disease, unspecified: Secondary | ICD-10-CM | POA: Diagnosis not present

## 2018-08-25 ENCOUNTER — Encounter (INDEPENDENT_AMBULATORY_CARE_PROVIDER_SITE_OTHER): Payer: Medicare Other | Admitting: Ophthalmology

## 2018-08-25 DIAGNOSIS — E11311 Type 2 diabetes mellitus with unspecified diabetic retinopathy with macular edema: Secondary | ICD-10-CM | POA: Diagnosis not present

## 2018-08-25 DIAGNOSIS — E113391 Type 2 diabetes mellitus with moderate nonproliferative diabetic retinopathy without macular edema, right eye: Secondary | ICD-10-CM

## 2018-08-25 DIAGNOSIS — H318 Other specified disorders of choroid: Secondary | ICD-10-CM

## 2018-08-25 DIAGNOSIS — E113312 Type 2 diabetes mellitus with moderate nonproliferative diabetic retinopathy with macular edema, left eye: Secondary | ICD-10-CM | POA: Diagnosis not present

## 2018-08-25 DIAGNOSIS — H43813 Vitreous degeneration, bilateral: Secondary | ICD-10-CM | POA: Diagnosis not present

## 2018-08-25 DIAGNOSIS — E11621 Type 2 diabetes mellitus with foot ulcer: Secondary | ICD-10-CM | POA: Diagnosis not present

## 2018-08-25 DIAGNOSIS — L97529 Non-pressure chronic ulcer of other part of left foot with unspecified severity: Secondary | ICD-10-CM | POA: Diagnosis not present

## 2018-08-26 DIAGNOSIS — I739 Peripheral vascular disease, unspecified: Secondary | ICD-10-CM | POA: Diagnosis not present

## 2018-08-26 DIAGNOSIS — L97528 Non-pressure chronic ulcer of other part of left foot with other specified severity: Secondary | ICD-10-CM | POA: Diagnosis not present

## 2018-08-26 DIAGNOSIS — E11621 Type 2 diabetes mellitus with foot ulcer: Secondary | ICD-10-CM | POA: Diagnosis not present

## 2018-08-26 DIAGNOSIS — Z89431 Acquired absence of right foot: Secondary | ICD-10-CM | POA: Diagnosis not present

## 2018-08-26 DIAGNOSIS — L97518 Non-pressure chronic ulcer of other part of right foot with other specified severity: Secondary | ICD-10-CM | POA: Diagnosis not present

## 2018-08-26 DIAGNOSIS — L97512 Non-pressure chronic ulcer of other part of right foot with fat layer exposed: Secondary | ICD-10-CM | POA: Diagnosis not present

## 2018-08-29 DIAGNOSIS — L97519 Non-pressure chronic ulcer of other part of right foot with unspecified severity: Secondary | ICD-10-CM | POA: Diagnosis not present

## 2018-08-29 DIAGNOSIS — L97518 Non-pressure chronic ulcer of other part of right foot with other specified severity: Secondary | ICD-10-CM | POA: Diagnosis not present

## 2018-08-29 DIAGNOSIS — E11621 Type 2 diabetes mellitus with foot ulcer: Secondary | ICD-10-CM | POA: Diagnosis not present

## 2018-08-29 DIAGNOSIS — I739 Peripheral vascular disease, unspecified: Secondary | ICD-10-CM | POA: Diagnosis not present

## 2018-08-29 DIAGNOSIS — Z89431 Acquired absence of right foot: Secondary | ICD-10-CM | POA: Diagnosis not present

## 2018-08-29 DIAGNOSIS — L97528 Non-pressure chronic ulcer of other part of left foot with other specified severity: Secondary | ICD-10-CM | POA: Diagnosis not present

## 2018-09-05 DIAGNOSIS — I739 Peripheral vascular disease, unspecified: Secondary | ICD-10-CM | POA: Diagnosis not present

## 2018-09-05 DIAGNOSIS — L97512 Non-pressure chronic ulcer of other part of right foot with fat layer exposed: Secondary | ICD-10-CM | POA: Diagnosis not present

## 2018-09-05 DIAGNOSIS — L97518 Non-pressure chronic ulcer of other part of right foot with other specified severity: Secondary | ICD-10-CM | POA: Diagnosis not present

## 2018-09-05 DIAGNOSIS — E119 Type 2 diabetes mellitus without complications: Secondary | ICD-10-CM | POA: Diagnosis not present

## 2018-09-05 DIAGNOSIS — E669 Obesity, unspecified: Secondary | ICD-10-CM | POA: Diagnosis not present

## 2018-09-05 DIAGNOSIS — Z89431 Acquired absence of right foot: Secondary | ICD-10-CM | POA: Diagnosis not present

## 2018-09-05 DIAGNOSIS — E11621 Type 2 diabetes mellitus with foot ulcer: Secondary | ICD-10-CM | POA: Diagnosis not present

## 2018-09-22 ENCOUNTER — Encounter (INDEPENDENT_AMBULATORY_CARE_PROVIDER_SITE_OTHER): Payer: Medicare Other | Admitting: Ophthalmology

## 2018-09-24 ENCOUNTER — Encounter (INDEPENDENT_AMBULATORY_CARE_PROVIDER_SITE_OTHER): Payer: Medicare Other | Admitting: Ophthalmology

## 2018-09-24 DIAGNOSIS — I10 Essential (primary) hypertension: Secondary | ICD-10-CM

## 2018-09-24 DIAGNOSIS — H35033 Hypertensive retinopathy, bilateral: Secondary | ICD-10-CM | POA: Diagnosis not present

## 2018-09-24 DIAGNOSIS — E113393 Type 2 diabetes mellitus with moderate nonproliferative diabetic retinopathy without macular edema, bilateral: Secondary | ICD-10-CM | POA: Diagnosis not present

## 2018-09-24 DIAGNOSIS — H318 Other specified disorders of choroid: Secondary | ICD-10-CM | POA: Diagnosis not present

## 2018-09-24 DIAGNOSIS — H43813 Vitreous degeneration, bilateral: Secondary | ICD-10-CM | POA: Diagnosis not present

## 2018-09-24 DIAGNOSIS — H2513 Age-related nuclear cataract, bilateral: Secondary | ICD-10-CM

## 2018-09-24 DIAGNOSIS — E11319 Type 2 diabetes mellitus with unspecified diabetic retinopathy without macular edema: Secondary | ICD-10-CM | POA: Diagnosis not present

## 2018-09-26 DIAGNOSIS — N189 Chronic kidney disease, unspecified: Secondary | ICD-10-CM | POA: Diagnosis not present

## 2018-09-26 DIAGNOSIS — Z89421 Acquired absence of other right toe(s): Secondary | ICD-10-CM | POA: Diagnosis not present

## 2018-09-26 DIAGNOSIS — E1169 Type 2 diabetes mellitus with other specified complication: Secondary | ICD-10-CM | POA: Diagnosis not present

## 2018-09-29 ENCOUNTER — Encounter (INDEPENDENT_AMBULATORY_CARE_PROVIDER_SITE_OTHER): Payer: Medicare Other | Admitting: Ophthalmology

## 2018-09-30 DIAGNOSIS — E669 Obesity, unspecified: Secondary | ICD-10-CM | POA: Diagnosis not present

## 2018-09-30 DIAGNOSIS — L97529 Non-pressure chronic ulcer of other part of left foot with unspecified severity: Secondary | ICD-10-CM | POA: Diagnosis not present

## 2018-09-30 DIAGNOSIS — L97519 Non-pressure chronic ulcer of other part of right foot with unspecified severity: Secondary | ICD-10-CM | POA: Diagnosis not present

## 2018-09-30 DIAGNOSIS — I739 Peripheral vascular disease, unspecified: Secondary | ICD-10-CM | POA: Diagnosis not present

## 2018-09-30 DIAGNOSIS — E11621 Type 2 diabetes mellitus with foot ulcer: Secondary | ICD-10-CM | POA: Diagnosis not present

## 2018-09-30 DIAGNOSIS — L97528 Non-pressure chronic ulcer of other part of left foot with other specified severity: Secondary | ICD-10-CM | POA: Diagnosis not present

## 2018-09-30 DIAGNOSIS — L97518 Non-pressure chronic ulcer of other part of right foot with other specified severity: Secondary | ICD-10-CM | POA: Diagnosis not present

## 2018-09-30 DIAGNOSIS — Z89431 Acquired absence of right foot: Secondary | ICD-10-CM | POA: Diagnosis not present

## 2018-10-10 DIAGNOSIS — I10 Essential (primary) hypertension: Secondary | ICD-10-CM | POA: Diagnosis not present

## 2018-10-10 DIAGNOSIS — E11621 Type 2 diabetes mellitus with foot ulcer: Secondary | ICD-10-CM | POA: Diagnosis not present

## 2018-10-10 DIAGNOSIS — E782 Mixed hyperlipidemia: Secondary | ICD-10-CM | POA: Diagnosis not present

## 2018-10-10 DIAGNOSIS — E1122 Type 2 diabetes mellitus with diabetic chronic kidney disease: Secondary | ICD-10-CM | POA: Diagnosis not present

## 2018-10-10 DIAGNOSIS — E11628 Type 2 diabetes mellitus with other skin complications: Secondary | ICD-10-CM | POA: Diagnosis not present

## 2018-10-14 DIAGNOSIS — Z89431 Acquired absence of right foot: Secondary | ICD-10-CM | POA: Diagnosis not present

## 2018-10-14 DIAGNOSIS — I739 Peripheral vascular disease, unspecified: Secondary | ICD-10-CM | POA: Diagnosis not present

## 2018-10-14 DIAGNOSIS — L97518 Non-pressure chronic ulcer of other part of right foot with other specified severity: Secondary | ICD-10-CM | POA: Diagnosis not present

## 2018-10-14 DIAGNOSIS — E11621 Type 2 diabetes mellitus with foot ulcer: Secondary | ICD-10-CM | POA: Diagnosis not present

## 2018-10-14 DIAGNOSIS — L97519 Non-pressure chronic ulcer of other part of right foot with unspecified severity: Secondary | ICD-10-CM | POA: Diagnosis not present

## 2018-10-14 DIAGNOSIS — L97529 Non-pressure chronic ulcer of other part of left foot with unspecified severity: Secondary | ICD-10-CM | POA: Diagnosis not present

## 2018-10-14 DIAGNOSIS — L97528 Non-pressure chronic ulcer of other part of left foot with other specified severity: Secondary | ICD-10-CM | POA: Diagnosis not present

## 2018-10-14 DIAGNOSIS — E669 Obesity, unspecified: Secondary | ICD-10-CM | POA: Diagnosis not present

## 2018-10-15 DIAGNOSIS — L89612 Pressure ulcer of right heel, stage 2: Secondary | ICD-10-CM | POA: Diagnosis not present

## 2018-10-15 DIAGNOSIS — E11621 Type 2 diabetes mellitus with foot ulcer: Secondary | ICD-10-CM | POA: Diagnosis not present

## 2018-10-15 DIAGNOSIS — E1122 Type 2 diabetes mellitus with diabetic chronic kidney disease: Secondary | ICD-10-CM | POA: Diagnosis not present

## 2018-10-15 DIAGNOSIS — L89622 Pressure ulcer of left heel, stage 2: Secondary | ICD-10-CM | POA: Diagnosis not present

## 2018-10-15 DIAGNOSIS — G4700 Insomnia, unspecified: Secondary | ICD-10-CM | POA: Diagnosis not present

## 2018-10-15 DIAGNOSIS — E875 Hyperkalemia: Secondary | ICD-10-CM | POA: Diagnosis not present

## 2018-10-15 DIAGNOSIS — Z89429 Acquired absence of other toe(s), unspecified side: Secondary | ICD-10-CM | POA: Diagnosis not present

## 2018-10-15 DIAGNOSIS — I10 Essential (primary) hypertension: Secondary | ICD-10-CM | POA: Diagnosis not present

## 2018-10-15 DIAGNOSIS — N184 Chronic kidney disease, stage 4 (severe): Secondary | ICD-10-CM | POA: Diagnosis not present

## 2018-10-15 DIAGNOSIS — E782 Mixed hyperlipidemia: Secondary | ICD-10-CM | POA: Diagnosis not present

## 2018-10-22 ENCOUNTER — Encounter (INDEPENDENT_AMBULATORY_CARE_PROVIDER_SITE_OTHER): Payer: Medicare Other | Admitting: Ophthalmology

## 2018-11-23 ENCOUNTER — Encounter: Payer: Self-pay | Admitting: General Surgery

## 2018-11-24 ENCOUNTER — Encounter (INDEPENDENT_AMBULATORY_CARE_PROVIDER_SITE_OTHER): Payer: Medicare Other | Admitting: Ophthalmology

## 2018-12-10 DIAGNOSIS — I10 Essential (primary) hypertension: Secondary | ICD-10-CM | POA: Diagnosis not present

## 2018-12-10 DIAGNOSIS — E782 Mixed hyperlipidemia: Secondary | ICD-10-CM | POA: Diagnosis not present

## 2018-12-10 DIAGNOSIS — N184 Chronic kidney disease, stage 4 (severe): Secondary | ICD-10-CM | POA: Diagnosis not present

## 2018-12-10 DIAGNOSIS — E1122 Type 2 diabetes mellitus with diabetic chronic kidney disease: Secondary | ICD-10-CM | POA: Diagnosis not present

## 2018-12-16 DIAGNOSIS — Z0000 Encounter for general adult medical examination without abnormal findings: Secondary | ICD-10-CM | POA: Diagnosis not present

## 2018-12-24 ENCOUNTER — Other Ambulatory Visit: Payer: Self-pay

## 2018-12-24 ENCOUNTER — Encounter (INDEPENDENT_AMBULATORY_CARE_PROVIDER_SITE_OTHER): Payer: Medicare Other | Admitting: Ophthalmology

## 2018-12-24 DIAGNOSIS — E113312 Type 2 diabetes mellitus with moderate nonproliferative diabetic retinopathy with macular edema, left eye: Secondary | ICD-10-CM

## 2018-12-24 DIAGNOSIS — H35033 Hypertensive retinopathy, bilateral: Secondary | ICD-10-CM

## 2018-12-24 DIAGNOSIS — H43813 Vitreous degeneration, bilateral: Secondary | ICD-10-CM | POA: Diagnosis not present

## 2018-12-24 DIAGNOSIS — E113391 Type 2 diabetes mellitus with moderate nonproliferative diabetic retinopathy without macular edema, right eye: Secondary | ICD-10-CM

## 2018-12-24 DIAGNOSIS — E11311 Type 2 diabetes mellitus with unspecified diabetic retinopathy with macular edema: Secondary | ICD-10-CM | POA: Diagnosis not present

## 2018-12-24 DIAGNOSIS — H2513 Age-related nuclear cataract, bilateral: Secondary | ICD-10-CM | POA: Diagnosis not present

## 2018-12-24 DIAGNOSIS — I10 Essential (primary) hypertension: Secondary | ICD-10-CM | POA: Diagnosis not present

## 2018-12-24 DIAGNOSIS — H318 Other specified disorders of choroid: Secondary | ICD-10-CM | POA: Diagnosis not present

## 2018-12-29 DIAGNOSIS — M79671 Pain in right foot: Secondary | ICD-10-CM | POA: Diagnosis not present

## 2018-12-29 DIAGNOSIS — I739 Peripheral vascular disease, unspecified: Secondary | ICD-10-CM | POA: Diagnosis not present

## 2018-12-29 DIAGNOSIS — E1140 Type 2 diabetes mellitus with diabetic neuropathy, unspecified: Secondary | ICD-10-CM | POA: Diagnosis not present

## 2018-12-29 DIAGNOSIS — M79672 Pain in left foot: Secondary | ICD-10-CM | POA: Diagnosis not present

## 2019-01-26 ENCOUNTER — Other Ambulatory Visit: Payer: Self-pay

## 2019-01-26 ENCOUNTER — Encounter (INDEPENDENT_AMBULATORY_CARE_PROVIDER_SITE_OTHER): Payer: Medicare Other | Admitting: Ophthalmology

## 2019-01-26 DIAGNOSIS — E11311 Type 2 diabetes mellitus with unspecified diabetic retinopathy with macular edema: Secondary | ICD-10-CM | POA: Diagnosis not present

## 2019-01-26 DIAGNOSIS — E113312 Type 2 diabetes mellitus with moderate nonproliferative diabetic retinopathy with macular edema, left eye: Secondary | ICD-10-CM | POA: Diagnosis not present

## 2019-01-26 DIAGNOSIS — E113391 Type 2 diabetes mellitus with moderate nonproliferative diabetic retinopathy without macular edema, right eye: Secondary | ICD-10-CM

## 2019-01-26 DIAGNOSIS — I10 Essential (primary) hypertension: Secondary | ICD-10-CM

## 2019-01-26 DIAGNOSIS — H35033 Hypertensive retinopathy, bilateral: Secondary | ICD-10-CM | POA: Diagnosis not present

## 2019-01-26 DIAGNOSIS — H43813 Vitreous degeneration, bilateral: Secondary | ICD-10-CM

## 2019-01-26 DIAGNOSIS — H318 Other specified disorders of choroid: Secondary | ICD-10-CM | POA: Diagnosis not present

## 2019-01-27 DIAGNOSIS — L97509 Non-pressure chronic ulcer of other part of unspecified foot with unspecified severity: Secondary | ICD-10-CM | POA: Diagnosis not present

## 2019-02-17 DIAGNOSIS — E1122 Type 2 diabetes mellitus with diabetic chronic kidney disease: Secondary | ICD-10-CM | POA: Diagnosis not present

## 2019-02-17 DIAGNOSIS — I10 Essential (primary) hypertension: Secondary | ICD-10-CM | POA: Diagnosis not present

## 2019-02-17 DIAGNOSIS — E11628 Type 2 diabetes mellitus with other skin complications: Secondary | ICD-10-CM | POA: Diagnosis not present

## 2019-02-17 DIAGNOSIS — N184 Chronic kidney disease, stage 4 (severe): Secondary | ICD-10-CM | POA: Diagnosis not present

## 2019-02-17 DIAGNOSIS — E11621 Type 2 diabetes mellitus with foot ulcer: Secondary | ICD-10-CM | POA: Diagnosis not present

## 2019-02-17 DIAGNOSIS — E782 Mixed hyperlipidemia: Secondary | ICD-10-CM | POA: Diagnosis not present

## 2019-02-17 DIAGNOSIS — E785 Hyperlipidemia, unspecified: Secondary | ICD-10-CM | POA: Diagnosis not present

## 2019-02-19 ENCOUNTER — Other Ambulatory Visit (HOSPITAL_COMMUNITY): Payer: Self-pay | Admitting: Internal Medicine

## 2019-02-19 DIAGNOSIS — Z1231 Encounter for screening mammogram for malignant neoplasm of breast: Secondary | ICD-10-CM

## 2019-02-19 DIAGNOSIS — Z780 Asymptomatic menopausal state: Secondary | ICD-10-CM

## 2019-02-20 DIAGNOSIS — E1122 Type 2 diabetes mellitus with diabetic chronic kidney disease: Secondary | ICD-10-CM | POA: Diagnosis not present

## 2019-02-20 DIAGNOSIS — I10 Essential (primary) hypertension: Secondary | ICD-10-CM | POA: Diagnosis not present

## 2019-02-20 DIAGNOSIS — E11621 Type 2 diabetes mellitus with foot ulcer: Secondary | ICD-10-CM | POA: Diagnosis not present

## 2019-02-20 DIAGNOSIS — N184 Chronic kidney disease, stage 4 (severe): Secondary | ICD-10-CM | POA: Diagnosis not present

## 2019-02-20 DIAGNOSIS — G4700 Insomnia, unspecified: Secondary | ICD-10-CM | POA: Diagnosis not present

## 2019-02-20 DIAGNOSIS — E782 Mixed hyperlipidemia: Secondary | ICD-10-CM | POA: Diagnosis not present

## 2019-02-20 DIAGNOSIS — Z89429 Acquired absence of other toe(s), unspecified side: Secondary | ICD-10-CM | POA: Diagnosis not present

## 2019-02-20 DIAGNOSIS — E875 Hyperkalemia: Secondary | ICD-10-CM | POA: Diagnosis not present

## 2019-02-20 DIAGNOSIS — L89612 Pressure ulcer of right heel, stage 2: Secondary | ICD-10-CM | POA: Diagnosis not present

## 2019-03-02 ENCOUNTER — Encounter (INDEPENDENT_AMBULATORY_CARE_PROVIDER_SITE_OTHER): Payer: Medicare Other | Admitting: Ophthalmology

## 2019-03-02 ENCOUNTER — Other Ambulatory Visit: Payer: Self-pay

## 2019-03-02 DIAGNOSIS — E11319 Type 2 diabetes mellitus with unspecified diabetic retinopathy without macular edema: Secondary | ICD-10-CM

## 2019-03-02 DIAGNOSIS — H35033 Hypertensive retinopathy, bilateral: Secondary | ICD-10-CM | POA: Diagnosis not present

## 2019-03-02 DIAGNOSIS — H43813 Vitreous degeneration, bilateral: Secondary | ICD-10-CM

## 2019-03-02 DIAGNOSIS — H318 Other specified disorders of choroid: Secondary | ICD-10-CM | POA: Diagnosis not present

## 2019-03-02 DIAGNOSIS — H2513 Age-related nuclear cataract, bilateral: Secondary | ICD-10-CM | POA: Diagnosis not present

## 2019-03-02 DIAGNOSIS — I10 Essential (primary) hypertension: Secondary | ICD-10-CM

## 2019-03-02 DIAGNOSIS — E113393 Type 2 diabetes mellitus with moderate nonproliferative diabetic retinopathy without macular edema, bilateral: Secondary | ICD-10-CM | POA: Diagnosis not present

## 2019-03-11 DIAGNOSIS — N185 Chronic kidney disease, stage 5: Secondary | ICD-10-CM | POA: Diagnosis not present

## 2019-03-11 DIAGNOSIS — E785 Hyperlipidemia, unspecified: Secondary | ICD-10-CM | POA: Diagnosis not present

## 2019-03-11 DIAGNOSIS — F5101 Primary insomnia: Secondary | ICD-10-CM | POA: Diagnosis not present

## 2019-03-11 DIAGNOSIS — Z8631 Personal history of diabetic foot ulcer: Secondary | ICD-10-CM | POA: Diagnosis not present

## 2019-03-11 DIAGNOSIS — L89602 Pressure ulcer of unspecified heel, stage 2: Secondary | ICD-10-CM | POA: Diagnosis not present

## 2019-03-11 DIAGNOSIS — E13621 Other specified diabetes mellitus with foot ulcer: Secondary | ICD-10-CM | POA: Diagnosis not present

## 2019-03-11 DIAGNOSIS — Z6841 Body Mass Index (BMI) 40.0 and over, adult: Secondary | ICD-10-CM | POA: Diagnosis not present

## 2019-03-11 DIAGNOSIS — L97509 Non-pressure chronic ulcer of other part of unspecified foot with unspecified severity: Secondary | ICD-10-CM | POA: Diagnosis not present

## 2019-03-11 DIAGNOSIS — L89892 Pressure ulcer of other site, stage 2: Secondary | ICD-10-CM | POA: Diagnosis not present

## 2019-03-11 DIAGNOSIS — M79671 Pain in right foot: Secondary | ICD-10-CM | POA: Diagnosis not present

## 2019-03-18 DIAGNOSIS — N184 Chronic kidney disease, stage 4 (severe): Secondary | ICD-10-CM | POA: Diagnosis not present

## 2019-03-18 DIAGNOSIS — E11621 Type 2 diabetes mellitus with foot ulcer: Secondary | ICD-10-CM | POA: Diagnosis not present

## 2019-03-18 DIAGNOSIS — E1122 Type 2 diabetes mellitus with diabetic chronic kidney disease: Secondary | ICD-10-CM | POA: Diagnosis not present

## 2019-03-18 DIAGNOSIS — E782 Mixed hyperlipidemia: Secondary | ICD-10-CM | POA: Diagnosis not present

## 2019-03-18 DIAGNOSIS — E11628 Type 2 diabetes mellitus with other skin complications: Secondary | ICD-10-CM | POA: Diagnosis not present

## 2019-03-18 DIAGNOSIS — F5101 Primary insomnia: Secondary | ICD-10-CM | POA: Diagnosis not present

## 2019-03-18 DIAGNOSIS — E875 Hyperkalemia: Secondary | ICD-10-CM | POA: Diagnosis not present

## 2019-03-18 DIAGNOSIS — I10 Essential (primary) hypertension: Secondary | ICD-10-CM | POA: Diagnosis not present

## 2019-03-27 ENCOUNTER — Ambulatory Visit (HOSPITAL_COMMUNITY): Payer: Medicare Other

## 2019-03-30 ENCOUNTER — Ambulatory Visit (HOSPITAL_COMMUNITY): Payer: Medicare Other

## 2019-03-30 ENCOUNTER — Encounter (INDEPENDENT_AMBULATORY_CARE_PROVIDER_SITE_OTHER): Payer: Medicare Other | Admitting: Ophthalmology

## 2019-03-30 ENCOUNTER — Other Ambulatory Visit (HOSPITAL_COMMUNITY): Payer: Medicare Other

## 2019-03-30 ENCOUNTER — Other Ambulatory Visit: Payer: Self-pay

## 2019-03-30 DIAGNOSIS — H35033 Hypertensive retinopathy, bilateral: Secondary | ICD-10-CM | POA: Diagnosis not present

## 2019-03-30 DIAGNOSIS — E11319 Type 2 diabetes mellitus with unspecified diabetic retinopathy without macular edema: Secondary | ICD-10-CM | POA: Diagnosis not present

## 2019-03-30 DIAGNOSIS — H43813 Vitreous degeneration, bilateral: Secondary | ICD-10-CM | POA: Diagnosis not present

## 2019-03-30 DIAGNOSIS — H318 Other specified disorders of choroid: Secondary | ICD-10-CM | POA: Diagnosis not present

## 2019-03-30 DIAGNOSIS — I10 Essential (primary) hypertension: Secondary | ICD-10-CM

## 2019-03-30 DIAGNOSIS — E113393 Type 2 diabetes mellitus with moderate nonproliferative diabetic retinopathy without macular edema, bilateral: Secondary | ICD-10-CM | POA: Diagnosis not present

## 2019-04-01 ENCOUNTER — Encounter (HOSPITAL_COMMUNITY): Payer: Self-pay

## 2019-04-01 ENCOUNTER — Other Ambulatory Visit: Payer: Self-pay

## 2019-04-01 ENCOUNTER — Ambulatory Visit (HOSPITAL_COMMUNITY)
Admission: RE | Admit: 2019-04-01 | Discharge: 2019-04-01 | Disposition: A | Discharge status: Home / Self Care | Payer: Medicare Other | Source: Ambulatory Visit | Attending: Internal Medicine | Admitting: Internal Medicine

## 2019-04-01 DIAGNOSIS — Z1231 Encounter for screening mammogram for malignant neoplasm of breast: Secondary | ICD-10-CM | POA: Insufficient documentation

## 2019-04-01 DIAGNOSIS — Z780 Asymptomatic menopausal state: Secondary | ICD-10-CM | POA: Insufficient documentation

## 2019-04-01 DIAGNOSIS — M8589 Other specified disorders of bone density and structure, multiple sites: Secondary | ICD-10-CM | POA: Diagnosis not present

## 2019-04-27 ENCOUNTER — Encounter (INDEPENDENT_AMBULATORY_CARE_PROVIDER_SITE_OTHER): Payer: Medicare Other | Admitting: Ophthalmology

## 2019-04-27 ENCOUNTER — Other Ambulatory Visit: Payer: Self-pay

## 2019-04-27 DIAGNOSIS — H318 Other specified disorders of choroid: Secondary | ICD-10-CM

## 2019-04-27 DIAGNOSIS — E113312 Type 2 diabetes mellitus with moderate nonproliferative diabetic retinopathy with macular edema, left eye: Secondary | ICD-10-CM

## 2019-04-27 DIAGNOSIS — E11311 Type 2 diabetes mellitus with unspecified diabetic retinopathy with macular edema: Secondary | ICD-10-CM | POA: Diagnosis not present

## 2019-04-27 DIAGNOSIS — H43813 Vitreous degeneration, bilateral: Secondary | ICD-10-CM | POA: Diagnosis not present

## 2019-04-27 DIAGNOSIS — H35033 Hypertensive retinopathy, bilateral: Secondary | ICD-10-CM | POA: Diagnosis not present

## 2019-04-27 DIAGNOSIS — I10 Essential (primary) hypertension: Secondary | ICD-10-CM | POA: Diagnosis not present

## 2019-04-27 DIAGNOSIS — E113591 Type 2 diabetes mellitus with proliferative diabetic retinopathy without macular edema, right eye: Secondary | ICD-10-CM | POA: Diagnosis not present

## 2019-05-25 ENCOUNTER — Other Ambulatory Visit: Payer: Self-pay

## 2019-05-25 ENCOUNTER — Encounter (INDEPENDENT_AMBULATORY_CARE_PROVIDER_SITE_OTHER): Payer: Medicare Other | Admitting: Ophthalmology

## 2019-05-25 DIAGNOSIS — H43813 Vitreous degeneration, bilateral: Secondary | ICD-10-CM

## 2019-05-25 DIAGNOSIS — H35033 Hypertensive retinopathy, bilateral: Secondary | ICD-10-CM

## 2019-05-25 DIAGNOSIS — E113391 Type 2 diabetes mellitus with moderate nonproliferative diabetic retinopathy without macular edema, right eye: Secondary | ICD-10-CM | POA: Diagnosis not present

## 2019-05-25 DIAGNOSIS — I10 Essential (primary) hypertension: Secondary | ICD-10-CM

## 2019-05-25 DIAGNOSIS — H318 Other specified disorders of choroid: Secondary | ICD-10-CM | POA: Diagnosis not present

## 2019-05-25 DIAGNOSIS — E113312 Type 2 diabetes mellitus with moderate nonproliferative diabetic retinopathy with macular edema, left eye: Secondary | ICD-10-CM | POA: Diagnosis not present

## 2019-06-01 DIAGNOSIS — E559 Vitamin D deficiency, unspecified: Secondary | ICD-10-CM | POA: Diagnosis not present

## 2019-06-01 DIAGNOSIS — Z79899 Other long term (current) drug therapy: Secondary | ICD-10-CM | POA: Diagnosis not present

## 2019-06-01 DIAGNOSIS — R809 Proteinuria, unspecified: Secondary | ICD-10-CM | POA: Diagnosis not present

## 2019-06-01 DIAGNOSIS — D649 Anemia, unspecified: Secondary | ICD-10-CM | POA: Diagnosis not present

## 2019-06-01 DIAGNOSIS — N184 Chronic kidney disease, stage 4 (severe): Secondary | ICD-10-CM | POA: Diagnosis not present

## 2019-06-01 DIAGNOSIS — I10 Essential (primary) hypertension: Secondary | ICD-10-CM | POA: Diagnosis not present

## 2019-06-03 DIAGNOSIS — E1129 Type 2 diabetes mellitus with other diabetic kidney complication: Secondary | ICD-10-CM | POA: Diagnosis not present

## 2019-06-03 DIAGNOSIS — R809 Proteinuria, unspecified: Secondary | ICD-10-CM | POA: Diagnosis not present

## 2019-06-03 DIAGNOSIS — E1122 Type 2 diabetes mellitus with diabetic chronic kidney disease: Secondary | ICD-10-CM | POA: Diagnosis not present

## 2019-06-03 DIAGNOSIS — E559 Vitamin D deficiency, unspecified: Secondary | ICD-10-CM | POA: Diagnosis not present

## 2019-06-03 DIAGNOSIS — E875 Hyperkalemia: Secondary | ICD-10-CM | POA: Diagnosis not present

## 2019-06-03 DIAGNOSIS — E211 Secondary hyperparathyroidism, not elsewhere classified: Secondary | ICD-10-CM | POA: Diagnosis not present

## 2019-06-03 DIAGNOSIS — N189 Chronic kidney disease, unspecified: Secondary | ICD-10-CM | POA: Diagnosis not present

## 2019-06-22 ENCOUNTER — Encounter (INDEPENDENT_AMBULATORY_CARE_PROVIDER_SITE_OTHER): Payer: Medicare Other | Admitting: Ophthalmology

## 2019-06-22 DIAGNOSIS — H43813 Vitreous degeneration, bilateral: Secondary | ICD-10-CM

## 2019-06-22 DIAGNOSIS — H318 Other specified disorders of choroid: Secondary | ICD-10-CM | POA: Diagnosis not present

## 2019-06-22 DIAGNOSIS — H2513 Age-related nuclear cataract, bilateral: Secondary | ICD-10-CM

## 2019-06-22 DIAGNOSIS — I10 Essential (primary) hypertension: Secondary | ICD-10-CM | POA: Diagnosis not present

## 2019-06-22 DIAGNOSIS — E11319 Type 2 diabetes mellitus with unspecified diabetic retinopathy without macular edema: Secondary | ICD-10-CM | POA: Diagnosis not present

## 2019-06-22 DIAGNOSIS — H35033 Hypertensive retinopathy, bilateral: Secondary | ICD-10-CM | POA: Diagnosis not present

## 2019-06-22 DIAGNOSIS — E113393 Type 2 diabetes mellitus with moderate nonproliferative diabetic retinopathy without macular edema, bilateral: Secondary | ICD-10-CM

## 2019-06-24 ENCOUNTER — Other Ambulatory Visit: Payer: Self-pay

## 2019-07-20 ENCOUNTER — Other Ambulatory Visit: Payer: Self-pay

## 2019-07-20 ENCOUNTER — Encounter (INDEPENDENT_AMBULATORY_CARE_PROVIDER_SITE_OTHER): Payer: Medicare Other | Admitting: Ophthalmology

## 2019-07-20 DIAGNOSIS — I10 Essential (primary) hypertension: Secondary | ICD-10-CM

## 2019-07-20 DIAGNOSIS — H35033 Hypertensive retinopathy, bilateral: Secondary | ICD-10-CM

## 2019-07-20 DIAGNOSIS — H43813 Vitreous degeneration, bilateral: Secondary | ICD-10-CM

## 2019-07-20 DIAGNOSIS — E113393 Type 2 diabetes mellitus with moderate nonproliferative diabetic retinopathy without macular edema, bilateral: Secondary | ICD-10-CM

## 2019-07-20 DIAGNOSIS — H318 Other specified disorders of choroid: Secondary | ICD-10-CM

## 2019-07-20 DIAGNOSIS — E11319 Type 2 diabetes mellitus with unspecified diabetic retinopathy without macular edema: Secondary | ICD-10-CM | POA: Diagnosis not present

## 2019-08-24 ENCOUNTER — Encounter (INDEPENDENT_AMBULATORY_CARE_PROVIDER_SITE_OTHER): Payer: Medicare Other | Admitting: Ophthalmology

## 2019-08-24 DIAGNOSIS — H318 Other specified disorders of choroid: Secondary | ICD-10-CM

## 2019-08-24 DIAGNOSIS — E11319 Type 2 diabetes mellitus with unspecified diabetic retinopathy without macular edema: Secondary | ICD-10-CM

## 2019-08-24 DIAGNOSIS — H43813 Vitreous degeneration, bilateral: Secondary | ICD-10-CM | POA: Diagnosis not present

## 2019-08-24 DIAGNOSIS — E113393 Type 2 diabetes mellitus with moderate nonproliferative diabetic retinopathy without macular edema, bilateral: Secondary | ICD-10-CM

## 2019-08-24 DIAGNOSIS — H35033 Hypertensive retinopathy, bilateral: Secondary | ICD-10-CM

## 2019-08-24 DIAGNOSIS — I10 Essential (primary) hypertension: Secondary | ICD-10-CM | POA: Diagnosis not present

## 2019-09-24 DIAGNOSIS — Z23 Encounter for immunization: Secondary | ICD-10-CM | POA: Diagnosis not present

## 2019-09-28 ENCOUNTER — Other Ambulatory Visit: Payer: Self-pay

## 2019-09-28 ENCOUNTER — Encounter (INDEPENDENT_AMBULATORY_CARE_PROVIDER_SITE_OTHER): Payer: Medicare Other | Admitting: Ophthalmology

## 2019-09-28 DIAGNOSIS — H4613 Retrobulbar neuritis, bilateral: Secondary | ICD-10-CM | POA: Diagnosis not present

## 2019-09-28 DIAGNOSIS — E113311 Type 2 diabetes mellitus with moderate nonproliferative diabetic retinopathy with macular edema, right eye: Secondary | ICD-10-CM

## 2019-09-28 DIAGNOSIS — E113392 Type 2 diabetes mellitus with moderate nonproliferative diabetic retinopathy without macular edema, left eye: Secondary | ICD-10-CM

## 2019-09-28 DIAGNOSIS — H318 Other specified disorders of choroid: Secondary | ICD-10-CM | POA: Diagnosis not present

## 2019-09-28 DIAGNOSIS — H43813 Vitreous degeneration, bilateral: Secondary | ICD-10-CM

## 2019-09-28 DIAGNOSIS — H35033 Hypertensive retinopathy, bilateral: Secondary | ICD-10-CM

## 2019-09-28 DIAGNOSIS — E11311 Type 2 diabetes mellitus with unspecified diabetic retinopathy with macular edema: Secondary | ICD-10-CM

## 2019-09-28 DIAGNOSIS — I10 Essential (primary) hypertension: Secondary | ICD-10-CM

## 2019-10-01 DIAGNOSIS — H35052 Retinal neovascularization, unspecified, left eye: Secondary | ICD-10-CM | POA: Diagnosis not present

## 2019-10-01 DIAGNOSIS — H25013 Cortical age-related cataract, bilateral: Secondary | ICD-10-CM | POA: Diagnosis not present

## 2019-10-01 DIAGNOSIS — H2513 Age-related nuclear cataract, bilateral: Secondary | ICD-10-CM | POA: Diagnosis not present

## 2019-10-01 DIAGNOSIS — E113393 Type 2 diabetes mellitus with moderate nonproliferative diabetic retinopathy without macular edema, bilateral: Secondary | ICD-10-CM | POA: Diagnosis not present

## 2019-10-01 DIAGNOSIS — H2512 Age-related nuclear cataract, left eye: Secondary | ICD-10-CM | POA: Diagnosis not present

## 2019-10-01 DIAGNOSIS — H35033 Hypertensive retinopathy, bilateral: Secondary | ICD-10-CM | POA: Diagnosis not present

## 2019-10-06 DIAGNOSIS — H2512 Age-related nuclear cataract, left eye: Secondary | ICD-10-CM | POA: Diagnosis not present

## 2019-10-06 DIAGNOSIS — H25812 Combined forms of age-related cataract, left eye: Secondary | ICD-10-CM | POA: Diagnosis not present

## 2019-10-12 DIAGNOSIS — Z6841 Body Mass Index (BMI) 40.0 and over, adult: Secondary | ICD-10-CM | POA: Diagnosis not present

## 2019-10-12 DIAGNOSIS — M79671 Pain in right foot: Secondary | ICD-10-CM | POA: Diagnosis not present

## 2019-10-12 DIAGNOSIS — L97509 Non-pressure chronic ulcer of other part of unspecified foot with unspecified severity: Secondary | ICD-10-CM | POA: Diagnosis not present

## 2019-10-12 DIAGNOSIS — E785 Hyperlipidemia, unspecified: Secondary | ICD-10-CM | POA: Diagnosis not present

## 2019-10-12 DIAGNOSIS — L89892 Pressure ulcer of other site, stage 2: Secondary | ICD-10-CM | POA: Diagnosis not present

## 2019-10-12 DIAGNOSIS — L89602 Pressure ulcer of unspecified heel, stage 2: Secondary | ICD-10-CM | POA: Diagnosis not present

## 2019-10-12 DIAGNOSIS — Z8631 Personal history of diabetic foot ulcer: Secondary | ICD-10-CM | POA: Diagnosis not present

## 2019-10-12 DIAGNOSIS — N185 Chronic kidney disease, stage 5: Secondary | ICD-10-CM | POA: Diagnosis not present

## 2019-10-12 DIAGNOSIS — F5101 Primary insomnia: Secondary | ICD-10-CM | POA: Diagnosis not present

## 2019-10-12 DIAGNOSIS — E13621 Other specified diabetes mellitus with foot ulcer: Secondary | ICD-10-CM | POA: Diagnosis not present

## 2019-10-13 DIAGNOSIS — E782 Mixed hyperlipidemia: Secondary | ICD-10-CM | POA: Diagnosis not present

## 2019-10-13 DIAGNOSIS — E875 Hyperkalemia: Secondary | ICD-10-CM | POA: Diagnosis not present

## 2019-10-13 DIAGNOSIS — R809 Proteinuria, unspecified: Secondary | ICD-10-CM | POA: Diagnosis not present

## 2019-10-13 DIAGNOSIS — G4700 Insomnia, unspecified: Secondary | ICD-10-CM | POA: Diagnosis not present

## 2019-10-13 DIAGNOSIS — M170 Bilateral primary osteoarthritis of knee: Secondary | ICD-10-CM | POA: Diagnosis not present

## 2019-10-13 DIAGNOSIS — E1122 Type 2 diabetes mellitus with diabetic chronic kidney disease: Secondary | ICD-10-CM | POA: Diagnosis not present

## 2019-10-13 DIAGNOSIS — I129 Hypertensive chronic kidney disease with stage 1 through stage 4 chronic kidney disease, or unspecified chronic kidney disease: Secondary | ICD-10-CM | POA: Diagnosis not present

## 2019-10-13 DIAGNOSIS — Z89429 Acquired absence of other toe(s), unspecified side: Secondary | ICD-10-CM | POA: Diagnosis not present

## 2019-10-14 IMAGING — MR MR FOOT*R* W/O CM
4 of 5 series · 13 of 40 positions shown · non-contrast
Comparison: Radiographs 12/21/2017

CLINICAL DATA: Open wound right foot. History of prior amputation.
Diabetic.

EXAM:
MRI OF THE RIGHT FOREFOOT WITHOUT CONTRAST
TECHNIQUE: Multiplanar, multisequence MR imaging of the right foot was
performed. No intravenous contrast was administered.

[Series 5: T1 · oblique · 4.0mm · 0.18mm/px · 4 of 32 slices shown (1 of 2)]
[im 1/32]
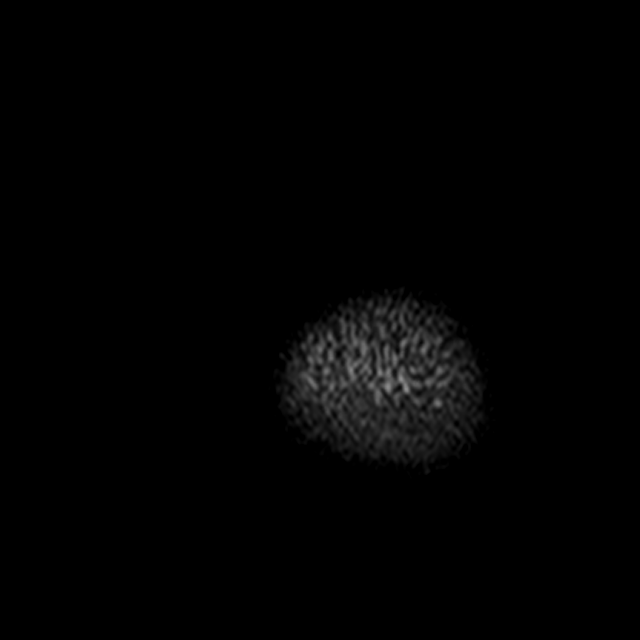
[im 5/32]
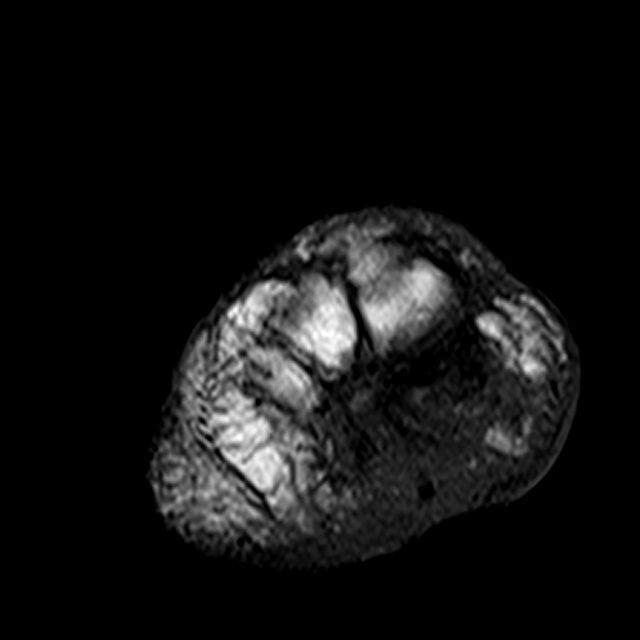
[im 18/32]
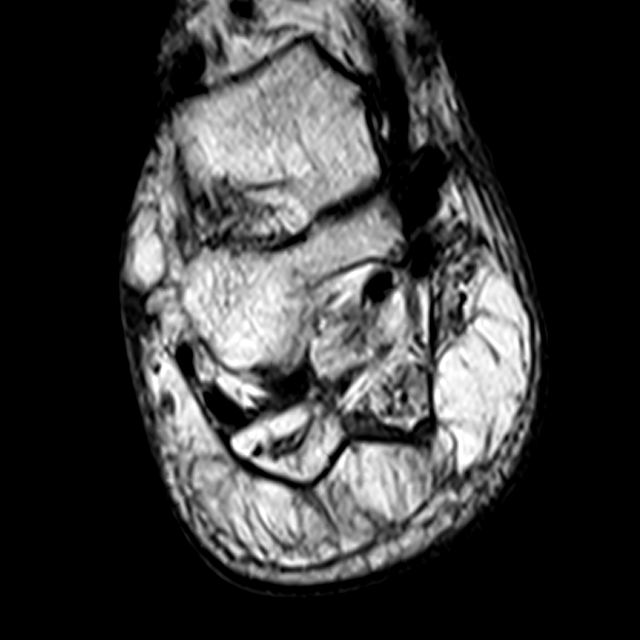
[im 27/32]
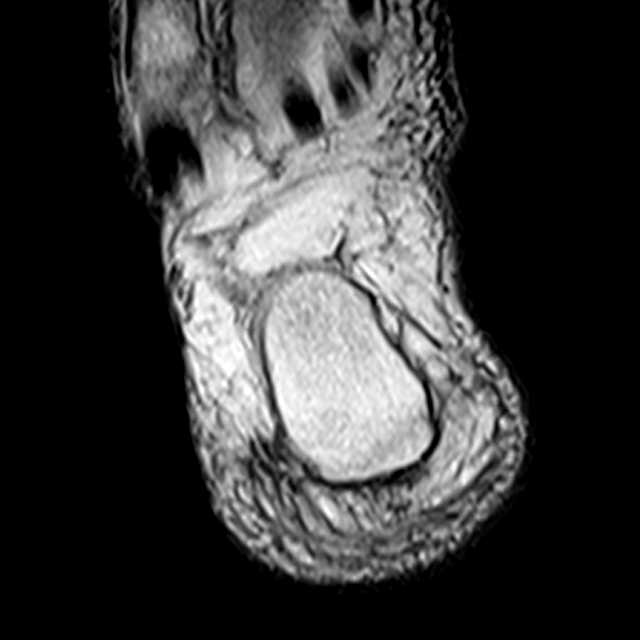

[Series 6: t2fs axial · oblique · 4.0mm · 0.18mm/px · 3 of 32 slices shown]
[im 5/32]
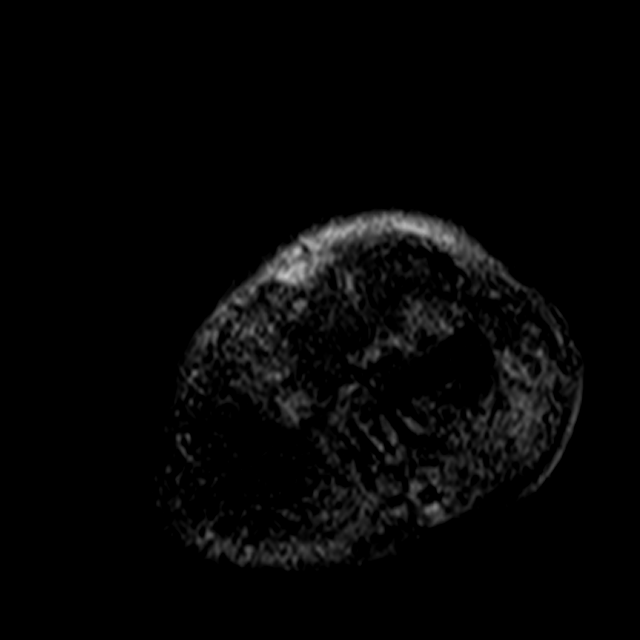
[im 18/32]
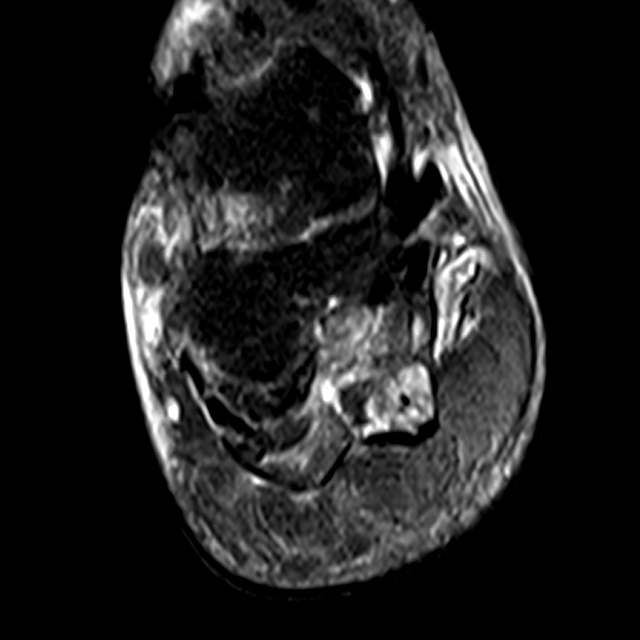
[im 27/32]
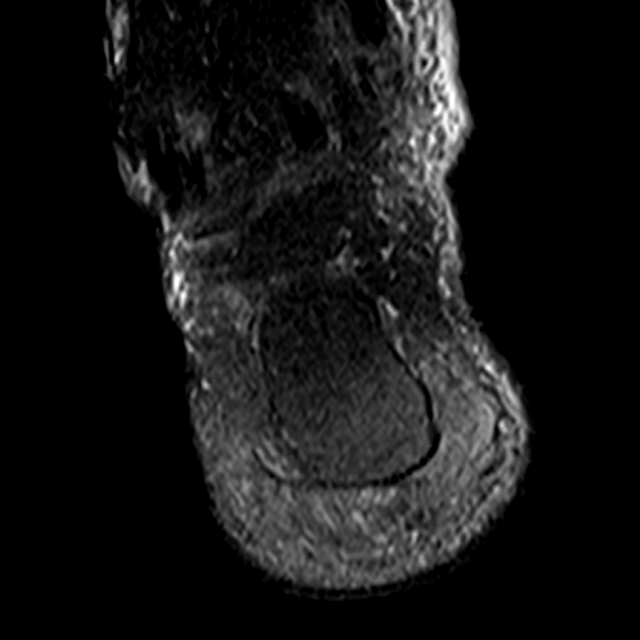

[Series 8: ir sagital · sagittal · 3.0mm · 0.25mm/px · 3 of 28 slices shown]
[im 4/28]
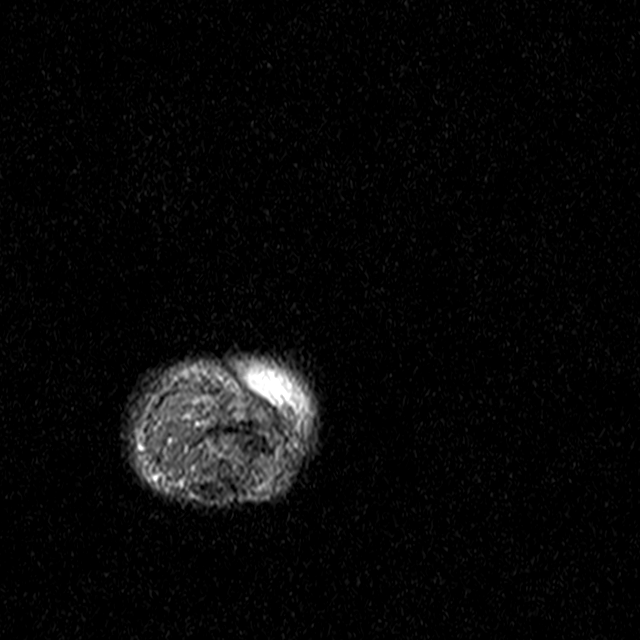
[im 16/28]
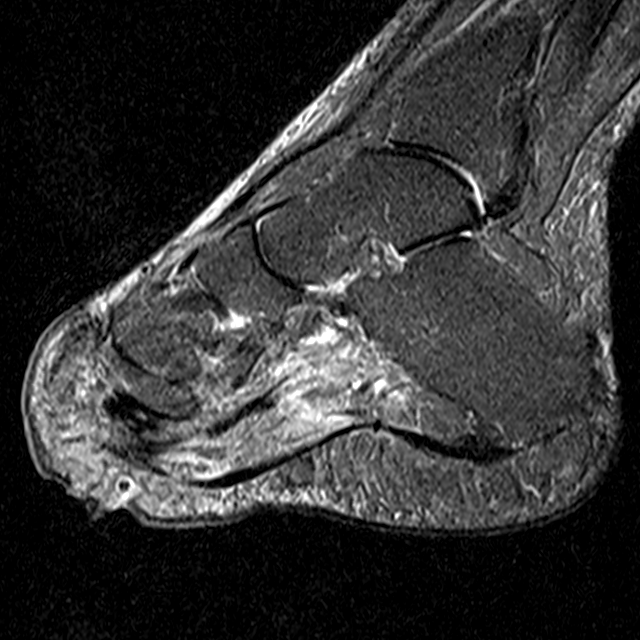
[im 24/28]
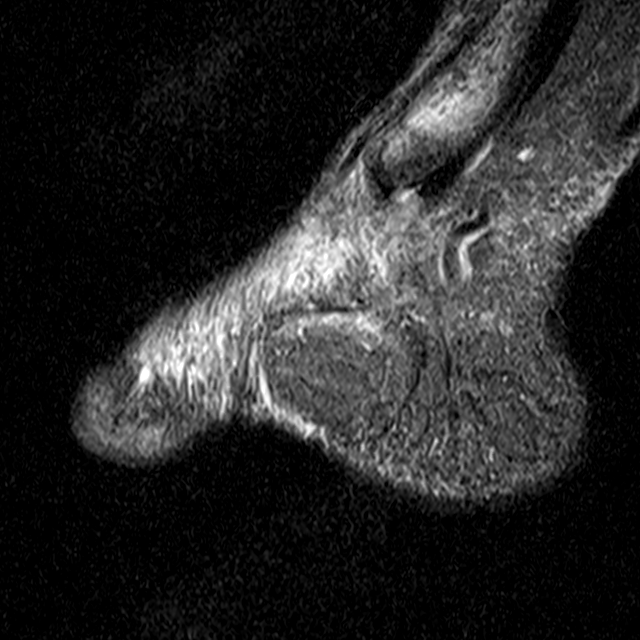

[Series 9: T1 · oblique · 3.0mm · 0.26mm/px · 3 of 28 slices shown (2 of 2)]
[im 4/28]
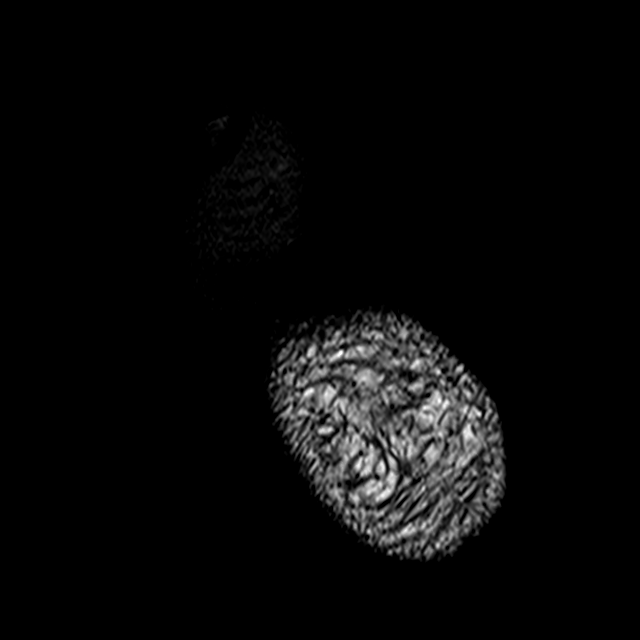
[im 16/28]
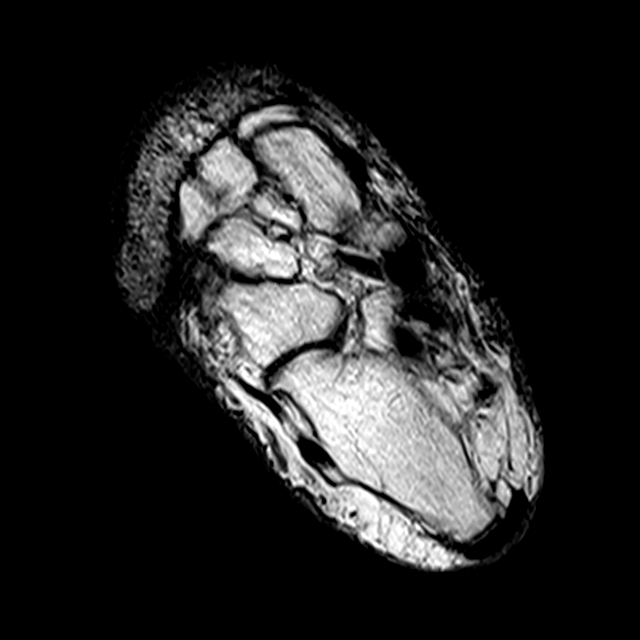
[im 24/28]
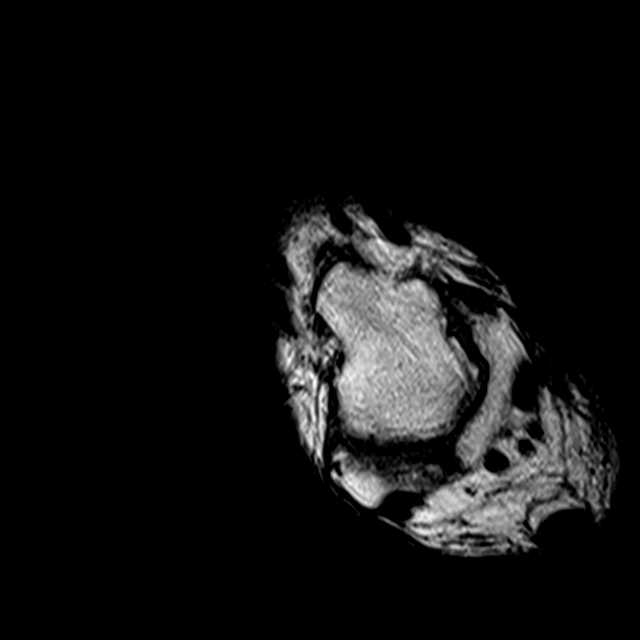

[13 of 40 positions shown; findings below may reference images not displayed]

FINDINGS: Evidence of prior amputation involving the forefoot. No MR findings
to suggest osteomyelitis.

Subcutaneous soft tissue swelling/edema involving the soft tissues
overlying the amputation site. Is also an open wound with some gas.
Small fluid collection is noted along the lateral aspect of the foot
near the lateral cuneiform. This measures 16 mm and could be a small
abscess.

Myositis involving the short flexor muscles but no findings for
pyomyositis. No findings for septic arthritis.
IMPRESSION: 1. Cellulitis and possible 16 mm abscess as described above.
2. Myofasciitis but no findings for pyomyositis.
3. No findings for septic arthritis or osteomyelitis.

## 2019-10-23 DIAGNOSIS — Z23 Encounter for immunization: Secondary | ICD-10-CM | POA: Diagnosis not present

## 2019-10-27 DIAGNOSIS — E11628 Type 2 diabetes mellitus with other skin complications: Secondary | ICD-10-CM | POA: Diagnosis not present

## 2019-10-27 DIAGNOSIS — E782 Mixed hyperlipidemia: Secondary | ICD-10-CM | POA: Diagnosis not present

## 2019-10-27 DIAGNOSIS — E875 Hyperkalemia: Secondary | ICD-10-CM | POA: Diagnosis not present

## 2019-10-27 DIAGNOSIS — E785 Hyperlipidemia, unspecified: Secondary | ICD-10-CM | POA: Diagnosis not present

## 2019-10-27 DIAGNOSIS — I10 Essential (primary) hypertension: Secondary | ICD-10-CM | POA: Diagnosis not present

## 2019-10-27 DIAGNOSIS — E13621 Other specified diabetes mellitus with foot ulcer: Secondary | ICD-10-CM | POA: Diagnosis not present

## 2019-10-27 DIAGNOSIS — I129 Hypertensive chronic kidney disease with stage 1 through stage 4 chronic kidney disease, or unspecified chronic kidney disease: Secondary | ICD-10-CM | POA: Diagnosis not present

## 2019-10-27 DIAGNOSIS — E1122 Type 2 diabetes mellitus with diabetic chronic kidney disease: Secondary | ICD-10-CM | POA: Diagnosis not present

## 2019-10-27 DIAGNOSIS — E11621 Type 2 diabetes mellitus with foot ulcer: Secondary | ICD-10-CM | POA: Diagnosis not present

## 2019-10-27 DIAGNOSIS — F5101 Primary insomnia: Secondary | ICD-10-CM | POA: Diagnosis not present

## 2019-10-27 DIAGNOSIS — G4700 Insomnia, unspecified: Secondary | ICD-10-CM | POA: Diagnosis not present

## 2019-10-28 DIAGNOSIS — H25011 Cortical age-related cataract, right eye: Secondary | ICD-10-CM | POA: Diagnosis not present

## 2019-10-28 DIAGNOSIS — H2511 Age-related nuclear cataract, right eye: Secondary | ICD-10-CM | POA: Diagnosis not present

## 2019-11-02 ENCOUNTER — Encounter (INDEPENDENT_AMBULATORY_CARE_PROVIDER_SITE_OTHER): Payer: Medicare Other | Admitting: Ophthalmology

## 2019-11-02 DIAGNOSIS — M25562 Pain in left knee: Secondary | ICD-10-CM | POA: Diagnosis not present

## 2019-11-03 DIAGNOSIS — H25811 Combined forms of age-related cataract, right eye: Secondary | ICD-10-CM | POA: Diagnosis not present

## 2019-11-03 DIAGNOSIS — H2511 Age-related nuclear cataract, right eye: Secondary | ICD-10-CM | POA: Diagnosis not present

## 2019-11-03 DIAGNOSIS — H25011 Cortical age-related cataract, right eye: Secondary | ICD-10-CM | POA: Diagnosis not present

## 2019-11-09 ENCOUNTER — Encounter (INDEPENDENT_AMBULATORY_CARE_PROVIDER_SITE_OTHER): Payer: Medicare Other | Admitting: Ophthalmology

## 2019-11-09 DIAGNOSIS — I10 Essential (primary) hypertension: Secondary | ICD-10-CM

## 2019-11-09 DIAGNOSIS — H318 Other specified disorders of choroid: Secondary | ICD-10-CM | POA: Diagnosis not present

## 2019-11-09 DIAGNOSIS — H35033 Hypertensive retinopathy, bilateral: Secondary | ICD-10-CM | POA: Diagnosis not present

## 2019-11-09 DIAGNOSIS — E11319 Type 2 diabetes mellitus with unspecified diabetic retinopathy without macular edema: Secondary | ICD-10-CM

## 2019-11-09 DIAGNOSIS — E113393 Type 2 diabetes mellitus with moderate nonproliferative diabetic retinopathy without macular edema, bilateral: Secondary | ICD-10-CM | POA: Diagnosis not present

## 2019-11-09 DIAGNOSIS — H43813 Vitreous degeneration, bilateral: Secondary | ICD-10-CM | POA: Diagnosis not present

## 2019-12-21 ENCOUNTER — Encounter (INDEPENDENT_AMBULATORY_CARE_PROVIDER_SITE_OTHER): Payer: Medicare Other | Admitting: Ophthalmology

## 2019-12-31 ENCOUNTER — Encounter (INDEPENDENT_AMBULATORY_CARE_PROVIDER_SITE_OTHER): Payer: Medicare Other | Admitting: Ophthalmology

## 2019-12-31 ENCOUNTER — Other Ambulatory Visit: Payer: Self-pay

## 2019-12-31 DIAGNOSIS — H43813 Vitreous degeneration, bilateral: Secondary | ICD-10-CM

## 2019-12-31 DIAGNOSIS — H318 Other specified disorders of choroid: Secondary | ICD-10-CM

## 2019-12-31 DIAGNOSIS — E113393 Type 2 diabetes mellitus with moderate nonproliferative diabetic retinopathy without macular edema, bilateral: Secondary | ICD-10-CM | POA: Diagnosis not present

## 2019-12-31 DIAGNOSIS — E11319 Type 2 diabetes mellitus with unspecified diabetic retinopathy without macular edema: Secondary | ICD-10-CM | POA: Diagnosis not present

## 2019-12-31 DIAGNOSIS — H35033 Hypertensive retinopathy, bilateral: Secondary | ICD-10-CM

## 2019-12-31 DIAGNOSIS — I10 Essential (primary) hypertension: Secondary | ICD-10-CM

## 2020-02-16 DIAGNOSIS — E785 Hyperlipidemia, unspecified: Secondary | ICD-10-CM | POA: Diagnosis not present

## 2020-02-16 DIAGNOSIS — I10 Essential (primary) hypertension: Secondary | ICD-10-CM | POA: Diagnosis not present

## 2020-02-16 DIAGNOSIS — I1310 Hypertensive heart and chronic kidney disease without heart failure, with stage 1 through stage 4 chronic kidney disease, or unspecified chronic kidney disease: Secondary | ICD-10-CM | POA: Diagnosis not present

## 2020-02-16 DIAGNOSIS — E1122 Type 2 diabetes mellitus with diabetic chronic kidney disease: Secondary | ICD-10-CM | POA: Diagnosis not present

## 2020-02-16 DIAGNOSIS — N184 Chronic kidney disease, stage 4 (severe): Secondary | ICD-10-CM | POA: Diagnosis not present

## 2020-02-16 DIAGNOSIS — E11621 Type 2 diabetes mellitus with foot ulcer: Secondary | ICD-10-CM | POA: Diagnosis not present

## 2020-02-16 DIAGNOSIS — N185 Chronic kidney disease, stage 5: Secondary | ICD-10-CM | POA: Diagnosis not present

## 2020-02-16 DIAGNOSIS — Z1321 Encounter for screening for nutritional disorder: Secondary | ICD-10-CM | POA: Diagnosis not present

## 2020-02-16 DIAGNOSIS — E782 Mixed hyperlipidemia: Secondary | ICD-10-CM | POA: Diagnosis not present

## 2020-02-18 ENCOUNTER — Encounter (INDEPENDENT_AMBULATORY_CARE_PROVIDER_SITE_OTHER): Payer: Medicare Other | Admitting: Ophthalmology

## 2020-02-18 ENCOUNTER — Other Ambulatory Visit: Payer: Self-pay

## 2020-02-18 DIAGNOSIS — H318 Other specified disorders of choroid: Secondary | ICD-10-CM | POA: Diagnosis not present

## 2020-02-18 DIAGNOSIS — E11319 Type 2 diabetes mellitus with unspecified diabetic retinopathy without macular edema: Secondary | ICD-10-CM

## 2020-02-18 DIAGNOSIS — E113393 Type 2 diabetes mellitus with moderate nonproliferative diabetic retinopathy without macular edema, bilateral: Secondary | ICD-10-CM

## 2020-02-18 DIAGNOSIS — H43813 Vitreous degeneration, bilateral: Secondary | ICD-10-CM

## 2020-02-19 DIAGNOSIS — I129 Hypertensive chronic kidney disease with stage 1 through stage 4 chronic kidney disease, or unspecified chronic kidney disease: Secondary | ICD-10-CM | POA: Diagnosis not present

## 2020-02-19 DIAGNOSIS — G4700 Insomnia, unspecified: Secondary | ICD-10-CM | POA: Diagnosis not present

## 2020-02-19 DIAGNOSIS — E1122 Type 2 diabetes mellitus with diabetic chronic kidney disease: Secondary | ICD-10-CM | POA: Diagnosis not present

## 2020-02-19 DIAGNOSIS — E875 Hyperkalemia: Secondary | ICD-10-CM | POA: Diagnosis not present

## 2020-02-19 DIAGNOSIS — E782 Mixed hyperlipidemia: Secondary | ICD-10-CM | POA: Diagnosis not present

## 2020-02-19 DIAGNOSIS — R809 Proteinuria, unspecified: Secondary | ICD-10-CM | POA: Diagnosis not present

## 2020-02-19 DIAGNOSIS — Z89429 Acquired absence of other toe(s), unspecified side: Secondary | ICD-10-CM | POA: Diagnosis not present

## 2020-02-19 DIAGNOSIS — N1832 Chronic kidney disease, stage 3b: Secondary | ICD-10-CM | POA: Diagnosis not present

## 2020-02-19 DIAGNOSIS — N184 Chronic kidney disease, stage 4 (severe): Secondary | ICD-10-CM | POA: Diagnosis not present

## 2020-02-19 DIAGNOSIS — M170 Bilateral primary osteoarthritis of knee: Secondary | ICD-10-CM | POA: Diagnosis not present

## 2020-03-09 DIAGNOSIS — Z8631 Personal history of diabetic foot ulcer: Secondary | ICD-10-CM | POA: Diagnosis not present

## 2020-03-09 DIAGNOSIS — N1832 Chronic kidney disease, stage 3b: Secondary | ICD-10-CM | POA: Diagnosis not present

## 2020-03-09 DIAGNOSIS — E13621 Other specified diabetes mellitus with foot ulcer: Secondary | ICD-10-CM | POA: Diagnosis not present

## 2020-03-09 DIAGNOSIS — Z6841 Body Mass Index (BMI) 40.0 and over, adult: Secondary | ICD-10-CM | POA: Diagnosis not present

## 2020-03-09 DIAGNOSIS — E785 Hyperlipidemia, unspecified: Secondary | ICD-10-CM | POA: Diagnosis not present

## 2020-03-09 DIAGNOSIS — L89602 Pressure ulcer of unspecified heel, stage 2: Secondary | ICD-10-CM | POA: Diagnosis not present

## 2020-03-09 DIAGNOSIS — L89892 Pressure ulcer of other site, stage 2: Secondary | ICD-10-CM | POA: Diagnosis not present

## 2020-03-09 DIAGNOSIS — M25562 Pain in left knee: Secondary | ICD-10-CM | POA: Diagnosis not present

## 2020-03-09 DIAGNOSIS — M79671 Pain in right foot: Secondary | ICD-10-CM | POA: Diagnosis not present

## 2020-03-09 DIAGNOSIS — N185 Chronic kidney disease, stage 5: Secondary | ICD-10-CM | POA: Diagnosis not present

## 2020-04-05 ENCOUNTER — Encounter (INDEPENDENT_AMBULATORY_CARE_PROVIDER_SITE_OTHER): Payer: Medicare Other | Admitting: Ophthalmology

## 2020-04-05 ENCOUNTER — Other Ambulatory Visit: Payer: Self-pay

## 2020-04-05 DIAGNOSIS — E113311 Type 2 diabetes mellitus with moderate nonproliferative diabetic retinopathy with macular edema, right eye: Secondary | ICD-10-CM

## 2020-04-05 DIAGNOSIS — H35033 Hypertensive retinopathy, bilateral: Secondary | ICD-10-CM | POA: Diagnosis not present

## 2020-04-05 DIAGNOSIS — H43813 Vitreous degeneration, bilateral: Secondary | ICD-10-CM | POA: Diagnosis not present

## 2020-04-05 DIAGNOSIS — E11311 Type 2 diabetes mellitus with unspecified diabetic retinopathy with macular edema: Secondary | ICD-10-CM

## 2020-04-05 DIAGNOSIS — I10 Essential (primary) hypertension: Secondary | ICD-10-CM

## 2020-04-05 DIAGNOSIS — H318 Other specified disorders of choroid: Secondary | ICD-10-CM

## 2020-04-05 DIAGNOSIS — E113392 Type 2 diabetes mellitus with moderate nonproliferative diabetic retinopathy without macular edema, left eye: Secondary | ICD-10-CM | POA: Diagnosis not present

## 2020-06-02 ENCOUNTER — Other Ambulatory Visit: Payer: Self-pay

## 2020-06-02 ENCOUNTER — Encounter (INDEPENDENT_AMBULATORY_CARE_PROVIDER_SITE_OTHER): Payer: Medicare Other | Admitting: Ophthalmology

## 2020-06-02 DIAGNOSIS — H43813 Vitreous degeneration, bilateral: Secondary | ICD-10-CM | POA: Diagnosis not present

## 2020-06-02 DIAGNOSIS — I10 Essential (primary) hypertension: Secondary | ICD-10-CM | POA: Diagnosis not present

## 2020-06-02 DIAGNOSIS — E113392 Type 2 diabetes mellitus with moderate nonproliferative diabetic retinopathy without macular edema, left eye: Secondary | ICD-10-CM

## 2020-06-02 DIAGNOSIS — H35033 Hypertensive retinopathy, bilateral: Secondary | ICD-10-CM | POA: Diagnosis not present

## 2020-06-02 DIAGNOSIS — E113311 Type 2 diabetes mellitus with moderate nonproliferative diabetic retinopathy with macular edema, right eye: Secondary | ICD-10-CM | POA: Diagnosis not present

## 2020-06-02 DIAGNOSIS — E11311 Type 2 diabetes mellitus with unspecified diabetic retinopathy with macular edema: Secondary | ICD-10-CM

## 2020-06-02 DIAGNOSIS — H318 Other specified disorders of choroid: Secondary | ICD-10-CM

## 2020-06-14 DIAGNOSIS — L89602 Pressure ulcer of unspecified heel, stage 2: Secondary | ICD-10-CM | POA: Diagnosis not present

## 2020-06-14 DIAGNOSIS — N1832 Chronic kidney disease, stage 3b: Secondary | ICD-10-CM | POA: Diagnosis not present

## 2020-06-14 DIAGNOSIS — E785 Hyperlipidemia, unspecified: Secondary | ICD-10-CM | POA: Diagnosis not present

## 2020-06-14 DIAGNOSIS — N185 Chronic kidney disease, stage 5: Secondary | ICD-10-CM | POA: Diagnosis not present

## 2020-06-14 DIAGNOSIS — E13621 Other specified diabetes mellitus with foot ulcer: Secondary | ICD-10-CM | POA: Diagnosis not present

## 2020-06-14 DIAGNOSIS — M25562 Pain in left knee: Secondary | ICD-10-CM | POA: Diagnosis not present

## 2020-06-14 DIAGNOSIS — M79671 Pain in right foot: Secondary | ICD-10-CM | POA: Diagnosis not present

## 2020-06-14 DIAGNOSIS — L89892 Pressure ulcer of other site, stage 2: Secondary | ICD-10-CM | POA: Diagnosis not present

## 2020-06-14 DIAGNOSIS — L97509 Non-pressure chronic ulcer of other part of unspecified foot with unspecified severity: Secondary | ICD-10-CM | POA: Diagnosis not present

## 2020-06-14 DIAGNOSIS — I10 Essential (primary) hypertension: Secondary | ICD-10-CM | POA: Diagnosis not present

## 2020-06-14 DIAGNOSIS — F5101 Primary insomnia: Secondary | ICD-10-CM | POA: Diagnosis not present

## 2020-06-17 DIAGNOSIS — M170 Bilateral primary osteoarthritis of knee: Secondary | ICD-10-CM | POA: Diagnosis not present

## 2020-06-17 DIAGNOSIS — E782 Mixed hyperlipidemia: Secondary | ICD-10-CM | POA: Diagnosis not present

## 2020-06-17 DIAGNOSIS — Z6841 Body Mass Index (BMI) 40.0 and over, adult: Secondary | ICD-10-CM | POA: Diagnosis not present

## 2020-06-17 DIAGNOSIS — Z0001 Encounter for general adult medical examination with abnormal findings: Secondary | ICD-10-CM | POA: Diagnosis not present

## 2020-06-17 DIAGNOSIS — E875 Hyperkalemia: Secondary | ICD-10-CM | POA: Diagnosis not present

## 2020-06-17 DIAGNOSIS — R809 Proteinuria, unspecified: Secondary | ICD-10-CM | POA: Diagnosis not present

## 2020-06-17 DIAGNOSIS — E1122 Type 2 diabetes mellitus with diabetic chronic kidney disease: Secondary | ICD-10-CM | POA: Diagnosis not present

## 2020-06-17 DIAGNOSIS — G4700 Insomnia, unspecified: Secondary | ICD-10-CM | POA: Diagnosis not present

## 2020-06-17 DIAGNOSIS — Z89429 Acquired absence of other toe(s), unspecified side: Secondary | ICD-10-CM | POA: Diagnosis not present

## 2020-06-17 DIAGNOSIS — I129 Hypertensive chronic kidney disease with stage 1 through stage 4 chronic kidney disease, or unspecified chronic kidney disease: Secondary | ICD-10-CM | POA: Diagnosis not present

## 2020-06-17 DIAGNOSIS — Z23 Encounter for immunization: Secondary | ICD-10-CM | POA: Diagnosis not present

## 2020-06-17 DIAGNOSIS — N184 Chronic kidney disease, stage 4 (severe): Secondary | ICD-10-CM | POA: Diagnosis not present

## 2020-07-14 DIAGNOSIS — Z23 Encounter for immunization: Secondary | ICD-10-CM | POA: Diagnosis not present

## 2020-08-04 ENCOUNTER — Encounter (INDEPENDENT_AMBULATORY_CARE_PROVIDER_SITE_OTHER): Payer: Medicare Other | Admitting: Ophthalmology

## 2020-08-09 ENCOUNTER — Encounter (INDEPENDENT_AMBULATORY_CARE_PROVIDER_SITE_OTHER): Payer: Medicare Other | Admitting: Ophthalmology

## 2020-08-10 ENCOUNTER — Other Ambulatory Visit: Payer: Self-pay

## 2020-08-10 ENCOUNTER — Encounter (INDEPENDENT_AMBULATORY_CARE_PROVIDER_SITE_OTHER): Payer: Medicare Other | Admitting: Ophthalmology

## 2020-08-10 DIAGNOSIS — H318 Other specified disorders of choroid: Secondary | ICD-10-CM | POA: Diagnosis not present

## 2020-08-10 DIAGNOSIS — H43813 Vitreous degeneration, bilateral: Secondary | ICD-10-CM

## 2020-08-10 DIAGNOSIS — E113312 Type 2 diabetes mellitus with moderate nonproliferative diabetic retinopathy with macular edema, left eye: Secondary | ICD-10-CM

## 2020-08-10 DIAGNOSIS — E113391 Type 2 diabetes mellitus with moderate nonproliferative diabetic retinopathy without macular edema, right eye: Secondary | ICD-10-CM | POA: Diagnosis not present

## 2020-08-10 DIAGNOSIS — H35033 Hypertensive retinopathy, bilateral: Secondary | ICD-10-CM | POA: Diagnosis not present

## 2020-08-10 DIAGNOSIS — I10 Essential (primary) hypertension: Secondary | ICD-10-CM | POA: Diagnosis not present

## 2020-10-12 ENCOUNTER — Encounter (INDEPENDENT_AMBULATORY_CARE_PROVIDER_SITE_OTHER): Payer: Medicare Other | Admitting: Ophthalmology

## 2020-10-12 ENCOUNTER — Other Ambulatory Visit: Payer: Self-pay

## 2020-10-12 DIAGNOSIS — H35372 Puckering of macula, left eye: Secondary | ICD-10-CM

## 2020-10-12 DIAGNOSIS — E113391 Type 2 diabetes mellitus with moderate nonproliferative diabetic retinopathy without macular edema, right eye: Secondary | ICD-10-CM | POA: Diagnosis not present

## 2020-10-12 DIAGNOSIS — I10 Essential (primary) hypertension: Secondary | ICD-10-CM | POA: Diagnosis not present

## 2020-10-12 DIAGNOSIS — H318 Other specified disorders of choroid: Secondary | ICD-10-CM | POA: Diagnosis not present

## 2020-10-12 DIAGNOSIS — E113312 Type 2 diabetes mellitus with moderate nonproliferative diabetic retinopathy with macular edema, left eye: Secondary | ICD-10-CM

## 2020-10-12 DIAGNOSIS — H43813 Vitreous degeneration, bilateral: Secondary | ICD-10-CM | POA: Diagnosis not present

## 2020-10-12 DIAGNOSIS — H35033 Hypertensive retinopathy, bilateral: Secondary | ICD-10-CM | POA: Diagnosis not present

## 2020-10-24 DIAGNOSIS — G4700 Insomnia, unspecified: Secondary | ICD-10-CM | POA: Diagnosis not present

## 2020-10-24 DIAGNOSIS — E559 Vitamin D deficiency, unspecified: Secondary | ICD-10-CM | POA: Diagnosis not present

## 2020-10-24 DIAGNOSIS — E1122 Type 2 diabetes mellitus with diabetic chronic kidney disease: Secondary | ICD-10-CM | POA: Diagnosis not present

## 2020-10-24 DIAGNOSIS — N184 Chronic kidney disease, stage 4 (severe): Secondary | ICD-10-CM | POA: Diagnosis not present

## 2020-10-24 DIAGNOSIS — M170 Bilateral primary osteoarthritis of knee: Secondary | ICD-10-CM | POA: Diagnosis not present

## 2020-10-24 DIAGNOSIS — R801 Persistent proteinuria, unspecified: Secondary | ICD-10-CM | POA: Diagnosis not present

## 2020-10-24 DIAGNOSIS — I129 Hypertensive chronic kidney disease with stage 1 through stage 4 chronic kidney disease, or unspecified chronic kidney disease: Secondary | ICD-10-CM | POA: Diagnosis not present

## 2020-10-24 DIAGNOSIS — E782 Mixed hyperlipidemia: Secondary | ICD-10-CM | POA: Diagnosis not present

## 2020-10-24 DIAGNOSIS — Z794 Long term (current) use of insulin: Secondary | ICD-10-CM | POA: Diagnosis not present

## 2020-12-14 ENCOUNTER — Other Ambulatory Visit: Payer: Self-pay

## 2020-12-14 ENCOUNTER — Encounter (INDEPENDENT_AMBULATORY_CARE_PROVIDER_SITE_OTHER): Payer: Medicare Other | Admitting: Ophthalmology

## 2020-12-14 DIAGNOSIS — H35033 Hypertensive retinopathy, bilateral: Secondary | ICD-10-CM

## 2020-12-14 DIAGNOSIS — H35371 Puckering of macula, right eye: Secondary | ICD-10-CM

## 2020-12-14 DIAGNOSIS — H43813 Vitreous degeneration, bilateral: Secondary | ICD-10-CM | POA: Diagnosis not present

## 2020-12-14 DIAGNOSIS — H318 Other specified disorders of choroid: Secondary | ICD-10-CM | POA: Diagnosis not present

## 2020-12-14 DIAGNOSIS — E113313 Type 2 diabetes mellitus with moderate nonproliferative diabetic retinopathy with macular edema, bilateral: Secondary | ICD-10-CM

## 2020-12-14 DIAGNOSIS — I10 Essential (primary) hypertension: Secondary | ICD-10-CM | POA: Diagnosis not present

## 2020-12-22 DIAGNOSIS — Z23 Encounter for immunization: Secondary | ICD-10-CM | POA: Diagnosis not present

## 2021-02-03 DIAGNOSIS — Z20822 Contact with and (suspected) exposure to covid-19: Secondary | ICD-10-CM | POA: Diagnosis not present

## 2021-02-15 ENCOUNTER — Encounter (INDEPENDENT_AMBULATORY_CARE_PROVIDER_SITE_OTHER): Payer: Medicare Other | Admitting: Ophthalmology

## 2021-02-15 ENCOUNTER — Other Ambulatory Visit: Payer: Self-pay

## 2021-02-15 DIAGNOSIS — I10 Essential (primary) hypertension: Secondary | ICD-10-CM

## 2021-02-15 DIAGNOSIS — H318 Other specified disorders of choroid: Secondary | ICD-10-CM

## 2021-02-15 DIAGNOSIS — E113392 Type 2 diabetes mellitus with moderate nonproliferative diabetic retinopathy without macular edema, left eye: Secondary | ICD-10-CM

## 2021-02-15 DIAGNOSIS — E113311 Type 2 diabetes mellitus with moderate nonproliferative diabetic retinopathy with macular edema, right eye: Secondary | ICD-10-CM

## 2021-02-15 DIAGNOSIS — H43813 Vitreous degeneration, bilateral: Secondary | ICD-10-CM | POA: Diagnosis not present

## 2021-02-15 DIAGNOSIS — H35033 Hypertensive retinopathy, bilateral: Secondary | ICD-10-CM

## 2021-02-15 DIAGNOSIS — H35371 Puckering of macula, right eye: Secondary | ICD-10-CM

## 2021-02-27 DIAGNOSIS — N184 Chronic kidney disease, stage 4 (severe): Secondary | ICD-10-CM | POA: Diagnosis not present

## 2021-02-27 DIAGNOSIS — Z794 Long term (current) use of insulin: Secondary | ICD-10-CM | POA: Diagnosis not present

## 2021-02-27 DIAGNOSIS — I129 Hypertensive chronic kidney disease with stage 1 through stage 4 chronic kidney disease, or unspecified chronic kidney disease: Secondary | ICD-10-CM | POA: Diagnosis not present

## 2021-02-27 DIAGNOSIS — Z1231 Encounter for screening mammogram for malignant neoplasm of breast: Secondary | ICD-10-CM | POA: Diagnosis not present

## 2021-02-27 DIAGNOSIS — Z89421 Acquired absence of other right toe(s): Secondary | ICD-10-CM | POA: Diagnosis not present

## 2021-02-27 DIAGNOSIS — E559 Vitamin D deficiency, unspecified: Secondary | ICD-10-CM | POA: Diagnosis not present

## 2021-02-27 DIAGNOSIS — J069 Acute upper respiratory infection, unspecified: Secondary | ICD-10-CM | POA: Diagnosis not present

## 2021-02-27 DIAGNOSIS — E782 Mixed hyperlipidemia: Secondary | ICD-10-CM | POA: Diagnosis not present

## 2021-02-27 DIAGNOSIS — Z0000 Encounter for general adult medical examination without abnormal findings: Secondary | ICD-10-CM | POA: Diagnosis not present

## 2021-02-27 DIAGNOSIS — E1122 Type 2 diabetes mellitus with diabetic chronic kidney disease: Secondary | ICD-10-CM | POA: Diagnosis not present

## 2021-03-08 ENCOUNTER — Other Ambulatory Visit (HOSPITAL_COMMUNITY): Payer: Self-pay | Admitting: Adult Health Nurse Practitioner

## 2021-03-08 DIAGNOSIS — Z1231 Encounter for screening mammogram for malignant neoplasm of breast: Secondary | ICD-10-CM

## 2021-03-08 DIAGNOSIS — Z20822 Contact with and (suspected) exposure to covid-19: Secondary | ICD-10-CM | POA: Diagnosis not present

## 2021-03-27 ENCOUNTER — Other Ambulatory Visit: Payer: Self-pay

## 2021-03-27 ENCOUNTER — Ambulatory Visit (HOSPITAL_COMMUNITY)
Admission: RE | Admit: 2021-03-27 | Discharge: 2021-03-27 | Disposition: A | Discharge status: Home / Self Care | Payer: Medicare Other | Source: Ambulatory Visit | Attending: Adult Health Nurse Practitioner | Admitting: Adult Health Nurse Practitioner

## 2021-03-27 DIAGNOSIS — Z1231 Encounter for screening mammogram for malignant neoplasm of breast: Secondary | ICD-10-CM | POA: Diagnosis not present

## 2021-04-19 ENCOUNTER — Encounter (INDEPENDENT_AMBULATORY_CARE_PROVIDER_SITE_OTHER): Payer: Medicare Other | Admitting: Ophthalmology

## 2021-04-19 ENCOUNTER — Other Ambulatory Visit: Payer: Self-pay

## 2021-04-19 DIAGNOSIS — E113393 Type 2 diabetes mellitus with moderate nonproliferative diabetic retinopathy without macular edema, bilateral: Secondary | ICD-10-CM

## 2021-04-19 DIAGNOSIS — I10 Essential (primary) hypertension: Secondary | ICD-10-CM | POA: Diagnosis not present

## 2021-04-19 DIAGNOSIS — H318 Other specified disorders of choroid: Secondary | ICD-10-CM

## 2021-04-19 DIAGNOSIS — H35033 Hypertensive retinopathy, bilateral: Secondary | ICD-10-CM

## 2021-06-08 DIAGNOSIS — Z20822 Contact with and (suspected) exposure to covid-19: Secondary | ICD-10-CM | POA: Diagnosis not present

## 2021-06-13 DIAGNOSIS — Z20822 Contact with and (suspected) exposure to covid-19: Secondary | ICD-10-CM | POA: Diagnosis not present

## 2021-06-21 ENCOUNTER — Encounter (INDEPENDENT_AMBULATORY_CARE_PROVIDER_SITE_OTHER): Payer: Medicare Other | Admitting: Ophthalmology

## 2021-06-26 ENCOUNTER — Encounter (INDEPENDENT_AMBULATORY_CARE_PROVIDER_SITE_OTHER): Payer: Medicare Other | Admitting: Ophthalmology

## 2021-06-26 ENCOUNTER — Other Ambulatory Visit: Payer: Self-pay

## 2021-06-26 DIAGNOSIS — I10 Essential (primary) hypertension: Secondary | ICD-10-CM

## 2021-06-26 DIAGNOSIS — H43813 Vitreous degeneration, bilateral: Secondary | ICD-10-CM

## 2021-06-26 DIAGNOSIS — E113391 Type 2 diabetes mellitus with moderate nonproliferative diabetic retinopathy without macular edema, right eye: Secondary | ICD-10-CM

## 2021-06-26 DIAGNOSIS — H35033 Hypertensive retinopathy, bilateral: Secondary | ICD-10-CM

## 2021-06-26 DIAGNOSIS — E113312 Type 2 diabetes mellitus with moderate nonproliferative diabetic retinopathy with macular edema, left eye: Secondary | ICD-10-CM | POA: Diagnosis not present

## 2021-06-26 DIAGNOSIS — H318 Other specified disorders of choroid: Secondary | ICD-10-CM

## 2021-06-30 DIAGNOSIS — N184 Chronic kidney disease, stage 4 (severe): Secondary | ICD-10-CM | POA: Diagnosis not present

## 2021-06-30 DIAGNOSIS — M7989 Other specified soft tissue disorders: Secondary | ICD-10-CM | POA: Diagnosis not present

## 2021-06-30 DIAGNOSIS — Z794 Long term (current) use of insulin: Secondary | ICD-10-CM | POA: Diagnosis not present

## 2021-06-30 DIAGNOSIS — J069 Acute upper respiratory infection, unspecified: Secondary | ICD-10-CM | POA: Diagnosis not present

## 2021-06-30 DIAGNOSIS — I129 Hypertensive chronic kidney disease with stage 1 through stage 4 chronic kidney disease, or unspecified chronic kidney disease: Secondary | ICD-10-CM | POA: Diagnosis not present

## 2021-06-30 DIAGNOSIS — E1122 Type 2 diabetes mellitus with diabetic chronic kidney disease: Secondary | ICD-10-CM | POA: Diagnosis not present

## 2021-06-30 DIAGNOSIS — E782 Mixed hyperlipidemia: Secondary | ICD-10-CM | POA: Diagnosis not present

## 2021-06-30 DIAGNOSIS — E559 Vitamin D deficiency, unspecified: Secondary | ICD-10-CM | POA: Diagnosis not present

## 2021-06-30 DIAGNOSIS — R062 Wheezing: Secondary | ICD-10-CM | POA: Diagnosis not present

## 2021-06-30 DIAGNOSIS — Z23 Encounter for immunization: Secondary | ICD-10-CM | POA: Diagnosis not present

## 2021-07-10 ENCOUNTER — Emergency Department (HOSPITAL_COMMUNITY)
Admission: EM | Admit: 2021-07-10 | Discharge: 2021-07-10 | Disposition: A | Discharge status: Home / Self Care | Payer: Medicare Other | Attending: Emergency Medicine | Admitting: Emergency Medicine

## 2021-07-10 ENCOUNTER — Other Ambulatory Visit: Payer: Self-pay

## 2021-07-10 ENCOUNTER — Encounter (HOSPITAL_COMMUNITY): Payer: Self-pay | Admitting: *Deleted

## 2021-07-10 ENCOUNTER — Emergency Department (HOSPITAL_COMMUNITY): Payer: Medicare Other

## 2021-07-10 DIAGNOSIS — N183 Chronic kidney disease, stage 3 unspecified: Secondary | ICD-10-CM | POA: Diagnosis not present

## 2021-07-10 DIAGNOSIS — E875 Hyperkalemia: Secondary | ICD-10-CM | POA: Insufficient documentation

## 2021-07-10 DIAGNOSIS — Z7984 Long term (current) use of oral hypoglycemic drugs: Secondary | ICD-10-CM | POA: Insufficient documentation

## 2021-07-10 DIAGNOSIS — Z79899 Other long term (current) drug therapy: Secondary | ICD-10-CM | POA: Insufficient documentation

## 2021-07-10 DIAGNOSIS — J111 Influenza due to unidentified influenza virus with other respiratory manifestations: Secondary | ICD-10-CM

## 2021-07-10 DIAGNOSIS — E119 Type 2 diabetes mellitus without complications: Secondary | ICD-10-CM | POA: Diagnosis not present

## 2021-07-10 DIAGNOSIS — Z794 Long term (current) use of insulin: Secondary | ICD-10-CM | POA: Insufficient documentation

## 2021-07-10 DIAGNOSIS — R059 Cough, unspecified: Secondary | ICD-10-CM | POA: Diagnosis not present

## 2021-07-10 DIAGNOSIS — Z7982 Long term (current) use of aspirin: Secondary | ICD-10-CM | POA: Diagnosis not present

## 2021-07-10 DIAGNOSIS — J101 Influenza due to other identified influenza virus with other respiratory manifestations: Secondary | ICD-10-CM | POA: Insufficient documentation

## 2021-07-10 DIAGNOSIS — Z20822 Contact with and (suspected) exposure to covid-19: Secondary | ICD-10-CM | POA: Insufficient documentation

## 2021-07-10 DIAGNOSIS — I129 Hypertensive chronic kidney disease with stage 1 through stage 4 chronic kidney disease, or unspecified chronic kidney disease: Secondary | ICD-10-CM | POA: Insufficient documentation

## 2021-07-10 LAB — CBC WITH DIFFERENTIAL/PLATELET
Abs Immature Granulocytes: 0.03 10*3/uL (ref 0.00–0.07)
Basophils Absolute: 0 10*3/uL (ref 0.0–0.1)
Basophils Relative: 0 %
Eosinophils Absolute: 0.2 10*3/uL (ref 0.0–0.5)
Eosinophils Relative: 2 %
HCT: 37.9 % (ref 36.0–46.0)
Hemoglobin: 11.8 g/dL — ABNORMAL LOW (ref 12.0–15.0)
Immature Granulocytes: 0 %
Lymphocytes Relative: 11 %
Lymphs Abs: 0.8 10*3/uL (ref 0.7–4.0)
MCH: 30.6 pg (ref 26.0–34.0)
MCHC: 31.1 g/dL (ref 30.0–36.0)
MCV: 98.4 fL (ref 80.0–100.0)
Monocytes Absolute: 1.1 10*3/uL — ABNORMAL HIGH (ref 0.1–1.0)
Monocytes Relative: 16 %
Neutro Abs: 4.8 10*3/uL (ref 1.7–7.7)
Neutrophils Relative %: 71 %
Platelets: 233 10*3/uL (ref 150–400)
RBC: 3.85 MIL/uL — ABNORMAL LOW (ref 3.87–5.11)
RDW: 13.1 % (ref 11.5–15.5)
WBC: 6.8 10*3/uL (ref 4.0–10.5)
nRBC: 0 % (ref 0.0–0.2)

## 2021-07-10 LAB — RESP PANEL BY RT-PCR (FLU A&B, COVID) ARPGX2
Influenza A by PCR: POSITIVE — AB
Influenza B by PCR: NEGATIVE
SARS Coronavirus 2 by RT PCR: NEGATIVE

## 2021-07-10 LAB — BASIC METABOLIC PANEL
Anion gap: 7 (ref 5–15)
BUN: 40 mg/dL — ABNORMAL HIGH (ref 8–23)
CO2: 22 mmol/L (ref 22–32)
Calcium: 8.8 mg/dL — ABNORMAL LOW (ref 8.9–10.3)
Chloride: 105 mmol/L (ref 98–111)
Creatinine, Ser: 2.64 mg/dL — ABNORMAL HIGH (ref 0.44–1.00)
GFR, Estimated: 19 mL/min — ABNORMAL LOW (ref >60)
Glucose, Bld: 193 mg/dL — ABNORMAL HIGH (ref 70–99)
Potassium: 5.2 mmol/L — ABNORMAL HIGH (ref 3.5–5.1)
Sodium: 134 mmol/L — ABNORMAL LOW (ref 135–145)

## 2021-07-10 MED ORDER — LOKELMA 5 G PO PACK: 5.0000 g | PACK | Freq: Every day | ORAL | 0 refills | Status: AC

## 2021-07-10 MED ORDER — ALBUTEROL SULFATE (2.5 MG/3ML) 0.083% IN NEBU
5.0000 mg | INHALATION_SOLUTION | Freq: Once | RESPIRATORY_TRACT | Status: AC
  Administered 2021-07-10: 5 mg via RESPIRATORY_TRACT
  Filled 2021-07-10: qty 6

## 2021-07-10 NOTE — ED Triage Notes (Signed)
Generally feeling bad, cough with congestion, unable to eat onset 4 days ago, has been feeling bad over a month

## 2021-07-10 NOTE — ED Provider Notes (Signed)
[facility] EMERGENCY DEPARTMENT Provider Note   CSN: [id number] Arrival date & time: 07/10/21  1148     History Chief Complaint  Patient presents with   Cough    Courtney Schneider is a 75 y.o. female.  HPI She presents for evaluation of ongoing cough, with congestion, for several weeks.  Symptoms are coming and going.  Improved when her PCP gave her Zithromax and a steroid injection, several days ago.  She denies shortness of breath, nausea, vomiting, focal weakness or paresthesia.  She has had decreased appetite.  She is not a smoker.  No chronic lung conditions.  No known sick contacts.  There are no other known active modifying factors.    Past Medical History:  Diagnosis Date   Diabetes mellitus without complication ([place])    Hypertension     Patient Active Problem List   Diagnosis Date Noted   ATN (acute tubular necrosis) ([place]) 12/24/2017   Acute renal failure superimposed on stage 3 chronic kidney disease ([place]) 12/24/2017   MRSA cellulitis of right foot 12/24/2017   Diabetic foot ulcer ([place])    Cellulitis 12/21/2017   AKI (acute kidney injury) ([place]) 12/21/2017   HTN (hypertension) 12/21/2017   HLD (hyperlipidemia) 12/21/2017   Insulin dependent type 2 diabetes mellitus ([place]) 12/21/2017    Past Surgical History:  Procedure Laterality Date   AMPUTATION TOE     CHOLECYSTECTOMY       OB History   No obstetric history on file.     No family history on file.  Social History   Tobacco Use   Smoking status: Never   Smokeless tobacco: Never  Vaping Use   Vaping Use: Never used  Substance Use Topics   Alcohol use: No   Drug use: No    Home Medications Prior to Admission medications   Medication Sig Start Date End Date Taking? Authorizing Provider  sodium zirconium cyclosilicate (LOKELMA) 5 g packet Take 5 g by mouth daily for 3 days. 07/10/21 07/13/21 Yes Daleen Bo, MD  amLODipine-valsartan (EXFORGE) 10-160 MG tablet Take 1 tablet by mouth daily.  Resume on 5/31 12/27/17   Barton Dubois, MD  aspirin EC 81 MG tablet Take 81 mg by mouth daily.    [provider]  ciprofloxacin (CIPRO) 500 MG tablet Take 500 mg by mouth 2 (two) times daily.    [provider]  furosemide (LASIX) 40 MG tablet Take 40 mg by mouth.    [provider]  insulin glargine (LANTUS) 100 UNIT/ML injection Inject 26 Units into the skin at bedtime.     [provider]  metoprolol (LOPRESSOR) 50 MG tablet Take 50 mg by mouth daily.    [provider]  Multiple Vitamins-Minerals (PRESERVISION AREDS PO) Take 1 tablet by mouth daily.    [provider]  pioglitazone (ACTOS) 15 MG tablet Take 15 mg by mouth daily.    [provider]  pravastatin (PRAVACHOL) 40 MG tablet Take 40 mg by mouth daily.    [provider]    Allergies    Patient has no known allergies.  Review of Systems   Review of Systems  All other systems reviewed and are negative.  Physical Exam Updated Vital Signs BP (!) 158/94   Pulse 86   Temp 98.2 F (36.8 C) (Oral)   Resp 16   SpO2 93%   Physical Exam Vitals and nursing note reviewed.  Constitutional:      General: She is not in acute  distress.    Appearance: She is well-developed. She is not ill-appearing, toxic-appearing or diaphoretic.  HENT:     Head: Normocephalic and atraumatic.     Right Ear: External ear normal.     Left Ear: External ear normal.  Eyes:     Conjunctiva/sclera: Conjunctivae normal.     Pupils: Pupils are equal, round, and reactive to light.  Neck:     Trachea: Phonation normal.  Cardiovascular:     Rate and Rhythm: Normal rate and regular rhythm.     Heart sounds: Normal heart sounds.  Pulmonary:     Effort: Pulmonary effort is normal. No respiratory distress.     Breath sounds: Normal breath sounds. No stridor. No wheezing.     Comments: Somewhat decreased air movement bilaterally. Abdominal:     General: There is no distension.      Palpations: Abdomen is soft.     Tenderness: There is no abdominal tenderness.  Musculoskeletal:        General: Normal range of motion.     Cervical back: Normal range of motion and neck supple.  Skin:    General: Skin is warm and dry.  Neurological:     Mental Status: She is alert and oriented to person, place, and time.     Cranial Nerves: No cranial nerve deficit.     Sensory: No sensory deficit.     Motor: No abnormal muscle tone.     Coordination: Coordination normal.  Psychiatric:        Behavior: Behavior normal.        Thought Content: Thought content normal.        Judgment: Judgment normal.    ED Results / Procedures / Treatments   Labs (all labs ordered are listed, but only abnormal results are displayed) Labs Reviewed  RESP PANEL BY RT-PCR (FLU A&B, COVID) ARPGX2 - Abnormal; Notable for the following components:      Result Value   Influenza A by PCR POSITIVE (*)    All other components within normal limits  BASIC METABOLIC PANEL - Abnormal; Notable for the following components:   Sodium 134 (*)    Potassium 5.2 (*)    Glucose, Bld 193 (*)    BUN 40 (*)    Creatinine, Ser 2.64 (*)    Calcium 8.8 (*)    GFR, Estimated 19 (*)    All other components within normal limits  CBC WITH DIFFERENTIAL/PLATELET - Abnormal; Notable for the following components:   RBC 3.85 (*)    Hemoglobin 11.8 (*)    Monocytes Absolute 1.1 (*)    All other components within normal limits    EKG None  Radiology DG Chest 2 View  Result Date: 07/10/2021 CLINICAL DATA:  Cough, congestion. EXAM: CHEST - 2 VIEW COMPARISON:  04/28/2009. FINDINGS: Trachea is midline. Heart is at the upper limits of normal in size. Lungs are clear. No airspace consolidation or pleural fluid. IMPRESSION: No acute findings. Electronically Signed   By: Lorin Picket M.D.   On: 07/10/2021 13:12    Procedures Procedures   Medications Ordered in ED Medications  albuterol (PROVENTIL) (2.5 MG/3ML) 0.083%  nebulizer solution 5 mg (5 mg Nebulization Given 07/10/21 1230)    ED Course  I have reviewed the triage vital signs and the nursing notes.  Pertinent labs & imaging results that were available during my care of the patient were reviewed by me and considered in my medical decision making (see chart for details).  MDM Rules/Calculators/A&P                            No data found.   At the time of discharge- reevaluation with update and discussion. After initial assessment and treatment, an updated evaluation reveals no further complaints.  Findings discussed with patient and daughter at bedside, all questions answered. Daleen Bo   Medical Decision Making:  This patient is presenting for evaluation of cough and nasal congestion, which does require a range of treatment options, and is a complaint that involves a moderate risk of morbidity and mortality. The differential diagnoses include viral infection, pneumonia, metabolic disorder. I decided to review old records, and in summary elderly female with history of hypertension, diabetes, tubular necrosis and acute renal failure.  I did not require additional historical information from anyone.  Clinical Laboratory Tests Ordered, included CBC, Metabolic panel, and viral panel . Review indicates normal except influenza positive, BUN high, creatinine high, calcium low, GFR low, potassium high, sodium low. Radiologic Tests Ordered, included chest x-ray.  I independently Visualized: Radiographic images, which show no infiltrate or edema   Critical Interventions-clinical evaluation, laboratory testing, radiography, medication treatment, observation and reassessment  After These Interventions, the Patient was reevaluated and was found stable for discharge.  Patient with influenza, without respiratory distress.  Incidental mild hyperkalemia which is recurrent.  She has previously required [facility], to treat hyperkalemia.  No evidence for  cardiovascular instability.  She does not require further ED interventions or hospitalizations.  There is no indication for initiation of antiviral medication.  CRITICAL CARE-no Performed by: Daleen Bo  Nursing Notes Reviewed/ Care Coordinated Applicable Imaging Reviewed Interpretation of Laboratory Data incorporated into ED treatment  The patient appears reasonably screened and/or stabilized for discharge and I doubt any other medical condition or other [facility] requiring further screening, evaluation, or treatment in the ED at this time prior to discharge.  Plan: Home Medications-continue current and use antibiotic OTC medication; Home Treatments-rest, fluids; return here if the recommended treatment, does not improve the symptoms; Recommended follow up-PCP, PRN     Final Clinical Impression(s) / ED Diagnoses Final diagnoses:  Influenza  Hyperkalemia    Rx / DC Orders ED Discharge Orders          Ordered    sodium zirconium cyclosilicate (LOKELMA) 5 g packet  Daily        07/10/21 1426             Daleen Bo, MD 07/11/21 1713

## 2021-07-10 NOTE — Discharge Instructions (Signed)
The testing indicates that you have influenza.  That is likely making her feel bad.  Continue taking your regular medication.  Use Tylenol for fever or pain.  We sent a prescription for glucometer to your pharmacy to treat some mild hyperkalemia.  Follow-up with your primary care doctor for checkup in a week or so.

## 2021-09-06 ENCOUNTER — Encounter (INDEPENDENT_AMBULATORY_CARE_PROVIDER_SITE_OTHER): Payer: Medicare Other | Admitting: Ophthalmology

## 2021-09-06 ENCOUNTER — Other Ambulatory Visit: Payer: Self-pay

## 2021-09-06 DIAGNOSIS — H318 Other specified disorders of choroid: Secondary | ICD-10-CM | POA: Diagnosis not present

## 2021-09-06 DIAGNOSIS — H43813 Vitreous degeneration, bilateral: Secondary | ICD-10-CM

## 2021-09-06 DIAGNOSIS — I10 Essential (primary) hypertension: Secondary | ICD-10-CM

## 2021-09-06 DIAGNOSIS — E113312 Type 2 diabetes mellitus with moderate nonproliferative diabetic retinopathy with macular edema, left eye: Secondary | ICD-10-CM | POA: Diagnosis not present

## 2021-09-06 DIAGNOSIS — H35033 Hypertensive retinopathy, bilateral: Secondary | ICD-10-CM

## 2021-09-06 DIAGNOSIS — E113391 Type 2 diabetes mellitus with moderate nonproliferative diabetic retinopathy without macular edema, right eye: Secondary | ICD-10-CM | POA: Diagnosis not present

## 2021-09-18 DIAGNOSIS — J069 Acute upper respiratory infection, unspecified: Secondary | ICD-10-CM | POA: Diagnosis not present

## 2021-09-18 DIAGNOSIS — Z7409 Other reduced mobility: Secondary | ICD-10-CM | POA: Diagnosis not present

## 2021-09-18 DIAGNOSIS — Z89421 Acquired absence of other right toe(s): Secondary | ICD-10-CM | POA: Diagnosis not present

## 2021-09-18 DIAGNOSIS — M25561 Pain in right knee: Secondary | ICD-10-CM | POA: Diagnosis not present

## 2021-09-26 DIAGNOSIS — Z20822 Contact with and (suspected) exposure to covid-19: Secondary | ICD-10-CM | POA: Diagnosis not present

## 2021-11-02 DIAGNOSIS — Z20828 Contact with and (suspected) exposure to other viral communicable diseases: Secondary | ICD-10-CM | POA: Diagnosis not present

## 2021-11-07 DIAGNOSIS — Z20822 Contact with and (suspected) exposure to covid-19: Secondary | ICD-10-CM | POA: Diagnosis not present

## 2021-11-10 DIAGNOSIS — Z794 Long term (current) use of insulin: Secondary | ICD-10-CM | POA: Diagnosis not present

## 2021-11-10 DIAGNOSIS — E1122 Type 2 diabetes mellitus with diabetic chronic kidney disease: Secondary | ICD-10-CM | POA: Diagnosis not present

## 2021-11-10 DIAGNOSIS — R062 Wheezing: Secondary | ICD-10-CM | POA: Diagnosis not present

## 2021-11-10 DIAGNOSIS — N184 Chronic kidney disease, stage 4 (severe): Secondary | ICD-10-CM | POA: Diagnosis not present

## 2021-11-10 DIAGNOSIS — E782 Mixed hyperlipidemia: Secondary | ICD-10-CM | POA: Diagnosis not present

## 2021-11-10 DIAGNOSIS — I129 Hypertensive chronic kidney disease with stage 1 through stage 4 chronic kidney disease, or unspecified chronic kidney disease: Secondary | ICD-10-CM | POA: Diagnosis not present

## 2021-11-13 DIAGNOSIS — Z20822 Contact with and (suspected) exposure to covid-19: Secondary | ICD-10-CM | POA: Diagnosis not present

## 2021-11-17 DIAGNOSIS — E875 Hyperkalemia: Secondary | ICD-10-CM | POA: Diagnosis not present

## 2021-11-17 DIAGNOSIS — Z20822 Contact with and (suspected) exposure to covid-19: Secondary | ICD-10-CM | POA: Diagnosis not present

## 2021-11-27 DIAGNOSIS — R059 Cough, unspecified: Secondary | ICD-10-CM | POA: Diagnosis not present

## 2021-11-27 DIAGNOSIS — Z20822 Contact with and (suspected) exposure to covid-19: Secondary | ICD-10-CM | POA: Diagnosis not present

## 2021-11-27 DIAGNOSIS — R051 Acute cough: Secondary | ICD-10-CM | POA: Diagnosis not present

## 2021-11-29 ENCOUNTER — Encounter (INDEPENDENT_AMBULATORY_CARE_PROVIDER_SITE_OTHER): Payer: Medicare Other | Admitting: Ophthalmology

## 2021-11-29 DIAGNOSIS — E113312 Type 2 diabetes mellitus with moderate nonproliferative diabetic retinopathy with macular edema, left eye: Secondary | ICD-10-CM | POA: Diagnosis not present

## 2021-11-29 DIAGNOSIS — E113391 Type 2 diabetes mellitus with moderate nonproliferative diabetic retinopathy without macular edema, right eye: Secondary | ICD-10-CM | POA: Diagnosis not present

## 2021-11-29 DIAGNOSIS — H43813 Vitreous degeneration, bilateral: Secondary | ICD-10-CM

## 2021-11-29 DIAGNOSIS — H318 Other specified disorders of choroid: Secondary | ICD-10-CM | POA: Diagnosis not present

## 2021-11-29 DIAGNOSIS — H35033 Hypertensive retinopathy, bilateral: Secondary | ICD-10-CM

## 2021-11-29 DIAGNOSIS — I10 Essential (primary) hypertension: Secondary | ICD-10-CM

## 2021-12-01 DIAGNOSIS — E875 Hyperkalemia: Secondary | ICD-10-CM | POA: Diagnosis not present

## 2021-12-04 DIAGNOSIS — Z20822 Contact with and (suspected) exposure to covid-19: Secondary | ICD-10-CM | POA: Diagnosis not present

## 2021-12-06 DIAGNOSIS — Z20822 Contact with and (suspected) exposure to covid-19: Secondary | ICD-10-CM | POA: Diagnosis not present

## 2022-03-14 ENCOUNTER — Encounter (INDEPENDENT_AMBULATORY_CARE_PROVIDER_SITE_OTHER): Payer: Medicare Other | Admitting: Ophthalmology

## 2022-03-19 ENCOUNTER — Encounter (INDEPENDENT_AMBULATORY_CARE_PROVIDER_SITE_OTHER): Payer: Medicare Other | Admitting: Ophthalmology

## 2022-03-28 ENCOUNTER — Encounter (INDEPENDENT_AMBULATORY_CARE_PROVIDER_SITE_OTHER): Payer: Medicare Other | Admitting: Ophthalmology

## 2022-03-28 DIAGNOSIS — H318 Other specified disorders of choroid: Secondary | ICD-10-CM

## 2022-03-28 DIAGNOSIS — H43813 Vitreous degeneration, bilateral: Secondary | ICD-10-CM

## 2022-03-28 DIAGNOSIS — I10 Essential (primary) hypertension: Secondary | ICD-10-CM

## 2022-03-28 DIAGNOSIS — E113391 Type 2 diabetes mellitus with moderate nonproliferative diabetic retinopathy without macular edema, right eye: Secondary | ICD-10-CM | POA: Diagnosis not present

## 2022-03-28 DIAGNOSIS — E113312 Type 2 diabetes mellitus with moderate nonproliferative diabetic retinopathy with macular edema, left eye: Secondary | ICD-10-CM | POA: Diagnosis not present

## 2022-03-28 DIAGNOSIS — H35033 Hypertensive retinopathy, bilateral: Secondary | ICD-10-CM | POA: Diagnosis not present

## 2022-04-11 DIAGNOSIS — I129 Hypertensive chronic kidney disease with stage 1 through stage 4 chronic kidney disease, or unspecified chronic kidney disease: Secondary | ICD-10-CM | POA: Diagnosis not present

## 2022-04-11 DIAGNOSIS — E1122 Type 2 diabetes mellitus with diabetic chronic kidney disease: Secondary | ICD-10-CM | POA: Diagnosis not present

## 2022-04-11 DIAGNOSIS — Z23 Encounter for immunization: Secondary | ICD-10-CM | POA: Diagnosis not present

## 2022-04-11 DIAGNOSIS — Z0000 Encounter for general adult medical examination without abnormal findings: Secondary | ICD-10-CM | POA: Diagnosis not present

## 2022-04-11 DIAGNOSIS — E559 Vitamin D deficiency, unspecified: Secondary | ICD-10-CM | POA: Diagnosis not present

## 2022-04-11 DIAGNOSIS — N184 Chronic kidney disease, stage 4 (severe): Secondary | ICD-10-CM | POA: Diagnosis not present

## 2022-04-11 DIAGNOSIS — Z794 Long term (current) use of insulin: Secondary | ICD-10-CM | POA: Diagnosis not present

## 2022-04-11 DIAGNOSIS — E782 Mixed hyperlipidemia: Secondary | ICD-10-CM | POA: Diagnosis not present

## 2022-05-23 ENCOUNTER — Encounter (INDEPENDENT_AMBULATORY_CARE_PROVIDER_SITE_OTHER): Payer: Medicare Other | Admitting: Ophthalmology

## 2022-05-28 ENCOUNTER — Encounter (INDEPENDENT_AMBULATORY_CARE_PROVIDER_SITE_OTHER): Payer: Medicare Other | Admitting: Ophthalmology

## 2022-05-30 ENCOUNTER — Encounter (INDEPENDENT_AMBULATORY_CARE_PROVIDER_SITE_OTHER): Payer: Medicare Other | Admitting: Ophthalmology

## 2022-05-30 DIAGNOSIS — I10 Essential (primary) hypertension: Secondary | ICD-10-CM | POA: Diagnosis not present

## 2022-05-30 DIAGNOSIS — H43813 Vitreous degeneration, bilateral: Secondary | ICD-10-CM

## 2022-05-30 DIAGNOSIS — H35033 Hypertensive retinopathy, bilateral: Secondary | ICD-10-CM

## 2022-05-30 DIAGNOSIS — H318 Other specified disorders of choroid: Secondary | ICD-10-CM

## 2022-05-30 DIAGNOSIS — E113393 Type 2 diabetes mellitus with moderate nonproliferative diabetic retinopathy without macular edema, bilateral: Secondary | ICD-10-CM

## 2022-06-17 ENCOUNTER — Other Ambulatory Visit: Payer: Self-pay

## 2022-06-17 ENCOUNTER — Inpatient Hospital Stay (HOSPITAL_COMMUNITY)
Admission: EM | Admit: 2022-06-17 | Discharge: 2022-06-22 | DRG: 871 | Disposition: A | Discharge status: Hospice | Payer: Medicare Other | Attending: Internal Medicine | Admitting: Internal Medicine

## 2022-06-17 ENCOUNTER — Emergency Department (HOSPITAL_COMMUNITY): Payer: Medicare Other

## 2022-06-17 ENCOUNTER — Inpatient Hospital Stay (HOSPITAL_COMMUNITY): Payer: Medicare Other

## 2022-06-17 ENCOUNTER — Encounter (HOSPITAL_COMMUNITY): Payer: Self-pay

## 2022-06-17 DIAGNOSIS — Z7982 Long term (current) use of aspirin: Secondary | ICD-10-CM

## 2022-06-17 DIAGNOSIS — M868X7 Other osteomyelitis, ankle and foot: Secondary | ICD-10-CM | POA: Diagnosis not present

## 2022-06-17 DIAGNOSIS — N184 Chronic kidney disease, stage 4 (severe): Secondary | ICD-10-CM | POA: Diagnosis present

## 2022-06-17 DIAGNOSIS — E11621 Type 2 diabetes mellitus with foot ulcer: Secondary | ICD-10-CM | POA: Diagnosis present

## 2022-06-17 DIAGNOSIS — Z66 Do not resuscitate: Secondary | ICD-10-CM | POA: Diagnosis not present

## 2022-06-17 DIAGNOSIS — E1140 Type 2 diabetes mellitus with diabetic neuropathy, unspecified: Secondary | ICD-10-CM | POA: Diagnosis present

## 2022-06-17 DIAGNOSIS — I5032 Chronic diastolic (congestive) heart failure: Secondary | ICD-10-CM | POA: Diagnosis not present

## 2022-06-17 DIAGNOSIS — R6520 Severe sepsis without septic shock: Secondary | ICD-10-CM | POA: Diagnosis present

## 2022-06-17 DIAGNOSIS — I5023 Acute on chronic systolic (congestive) heart failure: Secondary | ICD-10-CM | POA: Diagnosis not present

## 2022-06-17 DIAGNOSIS — E1169 Type 2 diabetes mellitus with other specified complication: Secondary | ICD-10-CM | POA: Diagnosis present

## 2022-06-17 DIAGNOSIS — I480 Paroxysmal atrial fibrillation: Secondary | ICD-10-CM | POA: Diagnosis not present

## 2022-06-17 DIAGNOSIS — Z6841 Body Mass Index (BMI) 40.0 and over, adult: Secondary | ICD-10-CM

## 2022-06-17 DIAGNOSIS — B9620 Unspecified Escherichia coli [E. coli] as the cause of diseases classified elsewhere: Secondary | ICD-10-CM | POA: Diagnosis present

## 2022-06-17 DIAGNOSIS — E785 Hyperlipidemia, unspecified: Secondary | ICD-10-CM | POA: Diagnosis present

## 2022-06-17 DIAGNOSIS — R609 Edema, unspecified: Secondary | ICD-10-CM | POA: Diagnosis not present

## 2022-06-17 DIAGNOSIS — I272 Pulmonary hypertension, unspecified: Secondary | ICD-10-CM | POA: Diagnosis present

## 2022-06-17 DIAGNOSIS — I10 Essential (primary) hypertension: Secondary | ICD-10-CM | POA: Diagnosis not present

## 2022-06-17 DIAGNOSIS — E1122 Type 2 diabetes mellitus with diabetic chronic kidney disease: Secondary | ICD-10-CM | POA: Diagnosis present

## 2022-06-17 DIAGNOSIS — R0600 Dyspnea, unspecified: Secondary | ICD-10-CM | POA: Diagnosis not present

## 2022-06-17 DIAGNOSIS — Z1152 Encounter for screening for COVID-19: Secondary | ICD-10-CM | POA: Diagnosis not present

## 2022-06-17 DIAGNOSIS — I700 Atherosclerosis of aorta: Secondary | ICD-10-CM | POA: Diagnosis not present

## 2022-06-17 DIAGNOSIS — Z7189 Other specified counseling: Secondary | ICD-10-CM

## 2022-06-17 DIAGNOSIS — R0602 Shortness of breath: Secondary | ICD-10-CM | POA: Diagnosis not present

## 2022-06-17 DIAGNOSIS — I350 Nonrheumatic aortic (valve) stenosis: Secondary | ICD-10-CM | POA: Diagnosis present

## 2022-06-17 DIAGNOSIS — M86271 Subacute osteomyelitis, right ankle and foot: Secondary | ICD-10-CM

## 2022-06-17 DIAGNOSIS — M86171 Other acute osteomyelitis, right ankle and foot: Secondary | ICD-10-CM | POA: Diagnosis present

## 2022-06-17 DIAGNOSIS — L97519 Non-pressure chronic ulcer of other part of right foot with unspecified severity: Secondary | ICD-10-CM | POA: Diagnosis present

## 2022-06-17 DIAGNOSIS — Z825 Family history of asthma and other chronic lower respiratory diseases: Secondary | ICD-10-CM

## 2022-06-17 DIAGNOSIS — T797XXA Traumatic subcutaneous emphysema, initial encounter: Secondary | ICD-10-CM | POA: Diagnosis not present

## 2022-06-17 DIAGNOSIS — Z9049 Acquired absence of other specified parts of digestive tract: Secondary | ICD-10-CM

## 2022-06-17 DIAGNOSIS — I130 Hypertensive heart and chronic kidney disease with heart failure and stage 1 through stage 4 chronic kidney disease, or unspecified chronic kidney disease: Secondary | ICD-10-CM | POA: Diagnosis present

## 2022-06-17 DIAGNOSIS — E1152 Type 2 diabetes mellitus with diabetic peripheral angiopathy with gangrene: Secondary | ICD-10-CM | POA: Diagnosis not present

## 2022-06-17 DIAGNOSIS — I5031 Acute diastolic (congestive) heart failure: Secondary | ICD-10-CM | POA: Diagnosis not present

## 2022-06-17 DIAGNOSIS — J9690 Respiratory failure, unspecified, unspecified whether with hypoxia or hypercapnia: Secondary | ICD-10-CM | POA: Diagnosis not present

## 2022-06-17 DIAGNOSIS — Z515 Encounter for palliative care: Secondary | ICD-10-CM | POA: Diagnosis not present

## 2022-06-17 DIAGNOSIS — I5043 Acute on chronic combined systolic (congestive) and diastolic (congestive) heart failure: Secondary | ICD-10-CM

## 2022-06-17 DIAGNOSIS — Z833 Family history of diabetes mellitus: Secondary | ICD-10-CM

## 2022-06-17 DIAGNOSIS — J9601 Acute respiratory failure with hypoxia: Secondary | ICD-10-CM

## 2022-06-17 DIAGNOSIS — E119 Type 2 diabetes mellitus without complications: Secondary | ICD-10-CM

## 2022-06-17 DIAGNOSIS — A419 Sepsis, unspecified organism: Principal | ICD-10-CM | POA: Diagnosis present

## 2022-06-17 DIAGNOSIS — D631 Anemia in chronic kidney disease: Secondary | ICD-10-CM | POA: Diagnosis present

## 2022-06-17 DIAGNOSIS — R52 Pain, unspecified: Secondary | ICD-10-CM | POA: Diagnosis not present

## 2022-06-17 DIAGNOSIS — R197 Diarrhea, unspecified: Secondary | ICD-10-CM | POA: Diagnosis present

## 2022-06-17 DIAGNOSIS — D6869 Other thrombophilia: Secondary | ICD-10-CM | POA: Diagnosis not present

## 2022-06-17 DIAGNOSIS — Z5329 Procedure and treatment not carried out because of patient's decision for other reasons: Secondary | ICD-10-CM | POA: Diagnosis not present

## 2022-06-17 DIAGNOSIS — L97509 Non-pressure chronic ulcer of other part of unspecified foot with unspecified severity: Secondary | ICD-10-CM | POA: Diagnosis not present

## 2022-06-17 DIAGNOSIS — I358 Other nonrheumatic aortic valve disorders: Secondary | ICD-10-CM | POA: Diagnosis present

## 2022-06-17 DIAGNOSIS — Z79899 Other long term (current) drug therapy: Secondary | ICD-10-CM

## 2022-06-17 DIAGNOSIS — J189 Pneumonia, unspecified organism: Secondary | ICD-10-CM | POA: Diagnosis not present

## 2022-06-17 DIAGNOSIS — Z89431 Acquired absence of right foot: Secondary | ICD-10-CM

## 2022-06-17 DIAGNOSIS — I2510 Atherosclerotic heart disease of native coronary artery without angina pectoris: Secondary | ICD-10-CM | POA: Diagnosis present

## 2022-06-17 DIAGNOSIS — M47814 Spondylosis without myelopathy or radiculopathy, thoracic region: Secondary | ICD-10-CM | POA: Diagnosis not present

## 2022-06-17 DIAGNOSIS — M869 Osteomyelitis, unspecified: Secondary | ICD-10-CM | POA: Diagnosis not present

## 2022-06-17 DIAGNOSIS — R296 Repeated falls: Secondary | ICD-10-CM | POA: Diagnosis present

## 2022-06-17 DIAGNOSIS — N179 Acute kidney failure, unspecified: Secondary | ICD-10-CM | POA: Diagnosis not present

## 2022-06-17 DIAGNOSIS — R0902 Hypoxemia: Secondary | ICD-10-CM | POA: Diagnosis not present

## 2022-06-17 DIAGNOSIS — Z7401 Bed confinement status: Secondary | ICD-10-CM | POA: Diagnosis not present

## 2022-06-17 DIAGNOSIS — L98499 Non-pressure chronic ulcer of skin of other sites with unspecified severity: Secondary | ICD-10-CM | POA: Diagnosis not present

## 2022-06-17 DIAGNOSIS — Z8249 Family history of ischemic heart disease and other diseases of the circulatory system: Secondary | ICD-10-CM

## 2022-06-17 DIAGNOSIS — Z794 Long term (current) use of insulin: Secondary | ICD-10-CM

## 2022-06-17 HISTORY — DX: Disorder of kidney and ureter, unspecified: N28.9

## 2022-06-17 LAB — COMPREHENSIVE METABOLIC PANEL
ALT: 8 U/L (ref 0–44)
AST: 16 U/L (ref 15–41)
Albumin: 3.2 g/dL — ABNORMAL LOW (ref 3.5–5.0)
Alkaline Phosphatase: 87 U/L (ref 38–126)
Anion gap: 13 (ref 5–15)
BUN: 54 mg/dL — ABNORMAL HIGH (ref 8–23)
CO2: 16 mmol/L — ABNORMAL LOW (ref 22–32)
Calcium: 8.8 mg/dL — ABNORMAL LOW (ref 8.9–10.3)
Chloride: 105 mmol/L (ref 98–111)
Creatinine, Ser: 3.81 mg/dL — ABNORMAL HIGH (ref 0.44–1.00)
GFR, Estimated: 12 mL/min — ABNORMAL LOW (ref >60)
Glucose, Bld: 168 mg/dL — ABNORMAL HIGH (ref 70–99)
Potassium: 4.7 mmol/L (ref 3.5–5.1)
Sodium: 134 mmol/L — ABNORMAL LOW (ref 135–145)
Total Bilirubin: 0.6 mg/dL (ref 0.3–1.2)
Total Protein: 7.3 g/dL (ref 6.5–8.1)

## 2022-06-17 LAB — CBC WITH DIFFERENTIAL/PLATELET
Abs Immature Granulocytes: 0.16 10*3/uL — ABNORMAL HIGH (ref 0.00–0.07)
Basophils Absolute: 0.1 10*3/uL (ref 0.0–0.1)
Basophils Relative: 0 %
Eosinophils Absolute: 0 10*3/uL (ref 0.0–0.5)
Eosinophils Relative: 0 %
HCT: 31.3 % — ABNORMAL LOW (ref 36.0–46.0)
Hemoglobin: 9.9 g/dL — ABNORMAL LOW (ref 12.0–15.0)
Immature Granulocytes: 1 %
Lymphocytes Relative: 6 %
Lymphs Abs: 1.2 10*3/uL (ref 0.7–4.0)
MCH: 30.5 pg (ref 26.0–34.0)
MCHC: 31.6 g/dL (ref 30.0–36.0)
MCV: 96.3 fL (ref 80.0–100.0)
Monocytes Absolute: 1.4 10*3/uL — ABNORMAL HIGH (ref 0.1–1.0)
Monocytes Relative: 7 %
Neutro Abs: 18 10*3/uL — ABNORMAL HIGH (ref 1.7–7.7)
Neutrophils Relative %: 86 %
Platelets: 376 10*3/uL (ref 150–400)
RBC: 3.25 MIL/uL — ABNORMAL LOW (ref 3.87–5.11)
RDW: 12.7 % (ref 11.5–15.5)
WBC: 20.9 10*3/uL — ABNORMAL HIGH (ref 4.0–10.5)
nRBC: 0 % (ref 0.0–0.2)

## 2022-06-17 LAB — PROTIME-INR
INR: 1.2 (ref 0.8–1.2)
Prothrombin Time: 14.9 seconds (ref 11.4–15.2)

## 2022-06-17 LAB — LACTIC ACID, PLASMA
Lactic Acid, Venous: 3 mmol/L (ref 0.5–1.9)
Lactic Acid, Venous: 1.6 mmol/L (ref 0.5–1.9)

## 2022-06-17 LAB — APTT: aPTT: 34 seconds (ref 24–36)

## 2022-06-17 LAB — CBG MONITORING, ED
Glucose-Capillary: 162 mg/dL — ABNORMAL HIGH (ref 70–99)
Glucose-Capillary: 162 mg/dL — ABNORMAL HIGH (ref 70–99)

## 2022-06-17 LAB — RESP PANEL BY RT-PCR (FLU A&B, COVID) ARPGX2
Influenza A by PCR: NEGATIVE
Influenza B by PCR: NEGATIVE
SARS Coronavirus 2 by RT PCR: NEGATIVE

## 2022-06-17 LAB — D-DIMER, QUANTITATIVE: D-Dimer, Quant: 3.31 ug/mL-FEU — ABNORMAL HIGH (ref 0.00–0.50)

## 2022-06-17 LAB — BRAIN NATRIURETIC PEPTIDE: B Natriuretic Peptide: 198 pg/mL — ABNORMAL HIGH (ref 0.0–100.0)

## 2022-06-17 LAB — SEDIMENTATION RATE: Sed Rate: 110 mm/hr — ABNORMAL HIGH (ref 0–22)

## 2022-06-17 LAB — GLUCOSE, CAPILLARY: Glucose-Capillary: 132 mg/dL — ABNORMAL HIGH (ref 70–99)

## 2022-06-17 LAB — C-REACTIVE PROTEIN: CRP: 9 mg/dL — ABNORMAL HIGH (ref <1.0)

## 2022-06-17 MED ORDER — SODIUM CHLORIDE 0.9 % IV SOLN: 2.0000 g | INTRAVENOUS | Status: DC

## 2022-06-17 MED ORDER — METRONIDAZOLE 500 MG PO TABS
500.0000 mg | ORAL_TABLET | Freq: Once | ORAL | Status: AC
  Administered 2022-06-17: 500 mg via ORAL
  Filled 2022-06-17: qty 1

## 2022-06-17 MED ORDER — INSULIN GLARGINE-YFGN 100 UNIT/ML ~~LOC~~ SOLN: 15.0000 [IU] | Freq: Every day | SUBCUTANEOUS | Status: DC

## 2022-06-17 MED ORDER — LACTATED RINGERS IV BOLUS (SEPSIS)
1000.0000 mL | Freq: Once | INTRAVENOUS | Status: AC
  Administered 2022-06-17: 1000 mL via INTRAVENOUS

## 2022-06-17 MED ORDER — INSULIN ASPART 100 UNIT/ML IJ SOLN
0.0000 [IU] | Freq: Three times a day (TID) | INTRAMUSCULAR | Status: DC
  Administered 2022-06-17: 2 [IU] via SUBCUTANEOUS
  Administered 2022-06-18 – 2022-06-21 (×6): 1 [IU] via SUBCUTANEOUS
  Administered 2022-06-22: 2 [IU] via SUBCUTANEOUS
  Filled 2022-06-17: qty 1

## 2022-06-17 MED ORDER — INSULIN GLARGINE-YFGN 100 UNIT/ML ~~LOC~~ SOLN
10.0000 [IU] | Freq: Every day | SUBCUTANEOUS | Status: DC
  Administered 2022-06-18 – 2022-06-20 (×4): 10 [IU] via SUBCUTANEOUS
  Filled 2022-06-17 (×7): qty 0.1

## 2022-06-17 MED ORDER — LACTATED RINGERS IV SOLN: INTRAVENOUS | Status: AC

## 2022-06-17 MED ORDER — HEPARIN BOLUS VIA INFUSION
5000.0000 [IU] | Freq: Once | INTRAVENOUS | Status: AC
  Administered 2022-06-17: 5000 [IU] via INTRAVENOUS

## 2022-06-17 MED ORDER — HYDROCODONE-ACETAMINOPHEN 5-325 MG PO TABS: 1.0000 | ORAL_TABLET | ORAL | Status: DC | PRN

## 2022-06-17 MED ORDER — LACTATED RINGERS IV BOLUS (SEPSIS)
800.0000 mL | Freq: Once | INTRAVENOUS | Status: AC
  Administered 2022-06-17: 800 mL via INTRAVENOUS

## 2022-06-17 MED ORDER — VANCOMYCIN HCL 2000 MG/400ML IV SOLN
2000.0000 mg | Freq: Once | INTRAVENOUS | Status: AC
  Administered 2022-06-17: 2000 mg via INTRAVENOUS
  Filled 2022-06-17: qty 400

## 2022-06-17 MED ORDER — HEPARIN (PORCINE) 25000 UT/250ML-% IV SOLN
1500.0000 [IU]/h | INTRAVENOUS | Status: DC
  Administered 2022-06-17: 1350 [IU]/h via INTRAVENOUS
  Administered 2022-06-18: 1500 [IU]/h via INTRAVENOUS
  Filled 2022-06-17 (×2): qty 250

## 2022-06-17 MED ORDER — SODIUM BICARBONATE 650 MG PO TABS
650.0000 mg | ORAL_TABLET | Freq: Every day | ORAL | Status: DC
  Administered 2022-06-17 – 2022-06-18 (×2): 650 mg via ORAL
  Filled 2022-06-17 (×2): qty 1

## 2022-06-17 MED ORDER — METOPROLOL TARTRATE 50 MG PO TABS
50.0000 mg | ORAL_TABLET | Freq: Every day | ORAL | Status: DC
  Administered 2022-06-17 – 2022-06-18 (×2): 50 mg via ORAL
  Filled 2022-06-17 (×2): qty 1

## 2022-06-17 MED ORDER — ACETAMINOPHEN 650 MG RE SUPP: 650.0000 mg | Freq: Four times a day (QID) | RECTAL | Status: DC | PRN

## 2022-06-17 MED ORDER — ONDANSETRON HCL 4 MG/2ML IJ SOLN
4.0000 mg | Freq: Four times a day (QID) | INTRAMUSCULAR | Status: DC | PRN
  Administered 2022-06-18 – 2022-06-21 (×2): 4 mg via INTRAVENOUS
  Filled 2022-06-17 (×2): qty 2

## 2022-06-17 MED ORDER — METRONIDAZOLE 500 MG/100ML IV SOLN: 500.0000 mg | Freq: Two times a day (BID) | INTRAVENOUS | Status: DC

## 2022-06-17 MED ORDER — ONDANSETRON HCL 4 MG PO TABS: 4.0000 mg | ORAL_TABLET | Freq: Four times a day (QID) | ORAL | Status: DC | PRN

## 2022-06-17 MED ORDER — VANCOMYCIN HCL IN DEXTROSE 1-5 GM/200ML-% IV SOLN
1000.0000 mg | INTRAVENOUS | Status: DC
  Administered 2022-06-19: 1000 mg via INTRAVENOUS
  Filled 2022-06-17: qty 200

## 2022-06-17 MED ORDER — ATORVASTATIN CALCIUM 10 MG PO TABS
10.0000 mg | ORAL_TABLET | Freq: Every day | ORAL | Status: DC
  Administered 2022-06-17 – 2022-06-18 (×2): 10 mg via ORAL
  Filled 2022-06-17 (×2): qty 1

## 2022-06-17 MED ORDER — VANCOMYCIN HCL IN DEXTROSE 1-5 GM/200ML-% IV SOLN: 1000.0000 mg | Freq: Once | INTRAVENOUS | Status: DC

## 2022-06-17 MED ORDER — FUROSEMIDE 10 MG/ML IJ SOLN
20.0000 mg | Freq: Once | INTRAMUSCULAR | Status: AC
  Administered 2022-06-17: 20 mg via INTRAVENOUS
  Filled 2022-06-17: qty 2

## 2022-06-17 MED ORDER — METRONIDAZOLE 500 MG/100ML IV SOLN
500.0000 mg | Freq: Two times a day (BID) | INTRAVENOUS | Status: DC
  Administered 2022-06-18 – 2022-06-21 (×7): 500 mg via INTRAVENOUS
  Filled 2022-06-17 (×7): qty 100

## 2022-06-17 MED ORDER — ACETAMINOPHEN 500 MG PO TABS
1000.0000 mg | ORAL_TABLET | Freq: Once | ORAL | Status: AC
  Administered 2022-06-17: 1000 mg via ORAL
  Filled 2022-06-17: qty 2

## 2022-06-17 MED ORDER — SODIUM CHLORIDE 0.9 % IV SOLN
2.0000 g | Freq: Once | INTRAVENOUS | Status: AC
  Administered 2022-06-17: 2 g via INTRAVENOUS
  Filled 2022-06-17: qty 20

## 2022-06-17 MED ORDER — ACETAMINOPHEN 325 MG PO TABS
650.0000 mg | ORAL_TABLET | Freq: Four times a day (QID) | ORAL | Status: DC | PRN
  Administered 2022-06-18 (×2): 650 mg via ORAL
  Filled 2022-06-17 (×2): qty 2

## 2022-06-17 NOTE — Assessment & Plan Note (Signed)
Has been normotensive to elevated Continue metoprolol, but hold if becomes hypotensive

## 2022-06-17 NOTE — ED Notes (Signed)
Unable to get a clear EKG at this time due to pt shivering and movement.

## 2022-06-17 NOTE — ED Notes (Signed)
RN walked into room to round on pt and O2 sat found to be at 84% on RA. Pt denied SOB. O2 placed on pt at 2L via [place] with no increase in O2 sat. RN continually titrated O2 up to 4L with O2 sat increasing to 89%. Dr. Rogers Blocker, Hospitalist, arrived at bedside and made aware of new findings. Lasix '20mg'$  IV ordered and given. Order given to decrease IVFs. Pt's complete fluid bolus dose of 1866m just completed at the same time. MD made aware. Dr. WRogers Blockerincreased O2 delivery to 6L via [place]. O2 sat currently 95%.

## 2022-06-17 NOTE — Assessment & Plan Note (Addendum)
Well controlled. A1C in 03/2022 was 6.5 Has not had her insulin in past few days. Decrease dose down to 10 units daily (normally on 26 units)  Sensitive SSI and accuchecks qac/hs

## 2022-06-17 NOTE — Assessment & Plan Note (Addendum)
Was satting 97% on RA and acutely became hypoxic to 80% and tachypneic but denied any shortness of breath Requiring 6L to maintain saturation -check BNP and repeat CXR stat -give '20mg'$  lasix stat (has received nearly 2L IVF) -stop IVF  -start heparin gtt in setting of sepsis, LE edema and acute respiratory failure for probably PE. Can not CTA due to renal function. D-dimer will likely be elevated in setting of chronic renal disease+sepsis, but will check -check bilateral DVT doppler/echo -VQ scan per day team

## 2022-06-17 NOTE — Assessment & Plan Note (Signed)
Last echo in 2018 and normal EF with grade 1 DD With what appears to be acute pulmonary edema for fluid IVF resus vs. PE vs. Acute on chronic diastolic CHF repeat echo ordered Strict I/O

## 2022-06-17 NOTE — ED Notes (Signed)
[facility] called at this time to setup transport.

## 2022-06-17 NOTE — Assessment & Plan Note (Signed)
Continue pravastatin 

## 2022-06-17 NOTE — ED Triage Notes (Signed)
Pt presents with "flu like symptoms" x 2 weeks. Today pt has had some disorientation this AM, chills, leg swelling, N/V.

## 2022-06-17 NOTE — ED Provider Notes (Signed)
[facility] EMERGENCY DEPARTMENT Provider Note   CSN: [id number] Arrival date & time: 06/17/22  1042     History  Chief Complaint  Patient presents with   Code Sepsis    Elayne KYNZLIE HILLEARY is a 76 y.o. female.  The history is provided by the patient, a relative and medical records. No language interpreter was used.     76 year old female significant history of hypertension, diabetes, renal disorder brought in via family member with weakness.  For the past week and a half patient endorsed progressive generalized weakness, subjective fever, chills, nausea, not eating much but is able to keep down fluid.  No report of headache, runny nose sneezing or coughing chest pain or shortness of breath abdominal pain or urinary symptoms.  She does have diabetic foot ulcer and daughter noticed increasing swelling to her right foot and leg.  She has had multiple toes amputation in the past secondary to diabetic complication.  She has peripheral neuropathy.  She denies any recent sick contact.  No specific treatment tried at home.  Daughter felt patient was a bit more disoriented this morning.  She also has some chills.  Home Medications Prior to Admission medications   Medication Sig Start Date End Date Taking? Authorizing Provider  amLODipine-valsartan (EXFORGE) 10-160 MG tablet Take 1 tablet by mouth daily. Resume on 5/31 12/27/17   Barton Dubois, MD  aspirin EC 81 MG tablet Take 81 mg by mouth daily.    [provider]  ciprofloxacin (CIPRO) 500 MG tablet Take 500 mg by mouth 2 (two) times daily.    [provider]  furosemide (LASIX) 40 MG tablet Take 40 mg by mouth.    [provider]  insulin glargine (LANTUS) 100 UNIT/ML injection Inject 26 Units into the skin at bedtime.     [provider]  metoprolol (LOPRESSOR) 50 MG tablet Take 50 mg by mouth daily.    [provider]  Multiple Vitamins-Minerals (PRESERVISION AREDS PO) Take 1 tablet by mouth  daily.    [provider]  pioglitazone (ACTOS) 15 MG tablet Take 15 mg by mouth daily.    [provider]  pravastatin (PRAVACHOL) 40 MG tablet Take 40 mg by mouth daily.    [provider]      Allergies    Patient has no known allergies.    Review of Systems   Review of Systems  All other systems reviewed and are negative.   Physical Exam Updated Vital Signs BP 113/71 (BP Location: Right Arm)   Pulse 99   Temp 100 F (37.8 C) (Oral)   Resp 20   Ht '5\' 4"'$  (1.626 m)   Wt 113.4 kg   SpO2 95%   BMI 42.91 kg/m  Physical Exam Vitals and nursing note reviewed.  Constitutional:      General: She is not in acute distress.    Appearance: She is well-developed. She is obese.  HENT:     Head: Atraumatic.  Eyes:     Conjunctiva/sclera: Conjunctivae normal.  Cardiovascular:     Rate and Rhythm: Normal rate and regular rhythm.     Pulses: Normal pulses.     Heart sounds: Normal heart sounds.  Pulmonary:     Effort: Pulmonary effort is normal.  Abdominal:     Palpations: Abdomen is soft.     Tenderness: There is no abdominal tenderness.  Musculoskeletal:        General: Tenderness (Right lower extremity: On the sole of her  right foot that has had multiple prior toe amputation there is an area of induration and fluctuant with necrotic skin changes concerning for for gangrene foot.  It is minimally tender to palpation.  Intact DP pulse) present.     Cervical back: Neck supple.  Skin:    Findings: No rash.  Neurological:     Mental Status: She is alert.  Psychiatric:        Mood and Affect: Mood normal.     ED Results / Procedures / Treatments   Labs (all labs ordered are listed, but only abnormal results are displayed) Labs Reviewed  COMPREHENSIVE METABOLIC PANEL - Abnormal; Notable for the following components:      Result Value   Sodium 134 (*)    CO2 16 (*)    Glucose, Bld 168 (*)    BUN 54 (*)    Creatinine, Ser 3.81 (*)    Calcium 8.8  (*)    Albumin 3.2 (*)    GFR, Estimated 12 (*)    All other components within normal limits  LACTIC ACID, PLASMA - Abnormal; Notable for the following components:   Lactic Acid, Venous 3.0 (*)    All other components within normal limits  CBC WITH DIFFERENTIAL/PLATELET - Abnormal; Notable for the following components:   WBC 20.9 (*)    RBC 3.25 (*)    Hemoglobin 9.9 (*)    HCT 31.3 (*)    Neutro Abs 18.0 (*)    Monocytes Absolute 1.4 (*)    Abs Immature Granulocytes 0.16 (*)    All other components within normal limits  CBG MONITORING, ED - Abnormal; Notable for the following components:   Glucose-Capillary 162 (*)    All other components within normal limits  CULTURE, BLOOD (ROUTINE X 2)  CULTURE, BLOOD (ROUTINE X 2)  RESP PANEL BY RT-PCR (FLU A&B, COVID) ARPGX2  URINE CULTURE  LACTIC ACID, PLASMA  PROTIME-INR  APTT  URINALYSIS, ROUTINE W REFLEX MICROSCOPIC  C-REACTIVE PROTEIN  SEDIMENTATION RATE    EKG None  Date: 06/17/2022  Rate: 89  Rhythm: normal sinus rhythm  QRS Axis: normal  Intervals: normal  ST/T Wave abnormalities: normal  Conduction Disutrbances: none  Narrative Interpretation:   Old EKG Reviewed: No significant changes noted    Radiology DG Foot 2 Views Right  Result Date: 06/17/2022 CLINICAL DATA:  Diabetic foot. EXAM: RIGHT FOOT - 2 VIEW COMPARISON:  None Available. FINDINGS: Post amputation midfoot level. Gas ulceration in the stomach. Osseous erosion. Potential skin ulceration posterior to the calcaneus. No subcutaneous gas identified. No osseous erosion. IMPRESSION: No osseous erosions to localize acute osteomyelitis. Electronically Signed   By: Suzy Bouchard M.D.   On: 06/17/2022 14:24   DG Chest 2 View  Result Date: 06/17/2022 CLINICAL DATA:  Suspected Sepsis EXAM: CHEST - 2 VIEW COMPARISON:  July 10, 2021 FINDINGS: The cardiomediastinal silhouette is unchanged in contour.Atherosclerotic calcifications. No pleural effusion. No  pneumothorax. No acute pleuroparenchymal abnormality. Visualized abdomen is unremarkable. Multilevel degenerative changes of the thoracic spine. IMPRESSION: No acute cardiopulmonary abnormality. Electronically Signed   By: Valentino Saxon M.D.   On: 06/17/2022 12:51    Procedures .Critical Care  Performed by: Domenic Moras, PA-C Authorized by: Domenic Moras, PA-C   Critical care provider statement:    Critical care time (minutes):  30   Critical care was time spent personally by me on the following activities:  Development of treatment plan with patient or surrogate, discussions with consultants, evaluation of patient's response  to treatment, examination of patient, ordering and review of laboratory studies, ordering and review of radiographic studies, ordering and performing treatments and interventions, pulse oximetry, re-evaluation of patient's condition and review of old charts     Medications Ordered in ED Medications  lactated ringers infusion (has no administration in time range)  lactated ringers bolus 1,000 mL (0 mLs Intravenous Stopped 06/17/22 1453)    And  lactated ringers bolus 800 mL (800 mLs Intravenous New Bag/Given 06/17/22 1508)  vancomycin (VANCOREADY) IVPB 2000 mg/400 mL (2,000 mg Intravenous New Bag/Given 06/17/22 1516)  vancomycin (VANCOCIN) IVPB 1000 mg/200 mL premix (has no administration in time range)  cefTRIAXone (ROCEPHIN) 2 g in sodium chloride 0.9 % 100 mL IVPB (0 g Intravenous Stopped 06/17/22 1439)  metroNIDAZOLE (FLAGYL) tablet 500 mg (500 mg Oral Given 06/17/22 1513)  acetaminophen (TYLENOL) tablet 1,000 mg (1,000 mg Oral Given 06/17/22 1407)    ED Course/ Medical Decision Making/ A&P                           Medical Decision Making Amount and/or Complexity of Data Reviewed Labs: ordered. Radiology: ordered. ECG/medicine tests: ordered.  Risk OTC drugs. Prescription drug management.   BP 113/71 (BP Location: Right Arm)   Pulse 99   Temp 100  F (37.8 C) (Oral)   Resp 20   Ht '5\' 4"'$  (1.626 m)   Wt 113.4 kg   SpO2 95%   BMI 42.91 kg/m   64:2 PM 76 year old female significant history of hypertension, diabetes, renal disorder brought in via family member with weakness.  For the past week and a half patient endorsed progressive generalized weakness, subjective fever, chills, nausea, not eating much but is able to keep down fluid.  No report of headache, runny nose sneezing or coughing chest pain or shortness of breath abdominal pain or urinary symptoms.  She does have diabetic foot ulcer and daughter noticed increasing swelling to her right foot and leg.  She has had multiple toes amputation in the past secondary to diabetic complication.  She has peripheral neuropathy.  She denies any recent sick contact.  No specific treatment tried at home.  Daughter felt patient was a bit more disoriented this morning.  She also has some chills.  On exam this is an elderly obese female laying in bed appears to be in some mild discomfort.  She is warm to the touch.  Heart and lung sounds normal.  Abdomen is soft and nontender.  Her right lower extremity is concerning for gangrene foot as the area necrotic skin changes to the sole of the foot with boggy discoloration she has intact DP pulse.  Her vital signs remarkable for an oral temperature of 100, blood pressure of 113 with 81, heart rate of 99, O2 sats of 95%.  Code sepsis have been initiated, will perform fluid resuscitation at 30 mill per kilogram as well as antibiotic which includes vancomycin cefepime, and metronidazole.  This patient presents to the ED for concern of fatigue, this involves an extensive number of treatment options, and is a complaint that carries with it a high risk of complications and morbidity.  The differential diagnosis includes sepsis, infected diabetic foot ulcer, UTI, pneumonia, electrolytes imbalance  Co morbidities that complicate the patient evaluation DM, HTN, kidney  disease Additional history obtained:  Additional history obtained from daughter External records from outside source obtained and reviewed including EMR  Lab Tests:  I Ordered, and personally interpreted  labs.  The pertinent results include:  Cr 3.81, worsen from prior, Lactic acid 3.0, WBC 20.9  Imaging Studies ordered:  I ordered imaging studies including R foot xray  I independently visualized and interpreted imaging which showed no obvious osteo I agree with the radiologist interpretation  Cardiac Monitoring:  The patient was maintained on a cardiac monitor.  I personally viewed and interpreted the cardiac monitored which showed an underlying rhythm of: NSR  Medicines ordered and prescription drug management:  I ordered medication including vanc/rocephin/metronidazole  for infected diabetic foot ulcer causing sepsis Reevaluation of the patient after these medicines showed that the patient improved I have reviewed the patients home medicines and have made adjustments as needed  Test Considered: as above  Critical Interventions: code sepsis  IVF resuscitation  Consultations Obtained:  I requested consultation with the hospitalist Dr. Rogers Blocker,  and discussed lab and imaging findings as well as pertinent plan - they recommend: admission  Problem List / ED Course: sepsis  Diabetic foot ulcer, R  Reevaluation:  After the interventions noted above, I reevaluated the patient and found that they have :improved  Social Determinants of Health:   Dispostion:  After consideration of the diagnostic results and the patients response to treatment, I feel that the patent would benefit from admission.         Final Clinical Impression(s) / ED Diagnoses Final diagnoses:  Sepsis, due to unspecified organism, unspecified whether acute organ dysfunction present [facility])  Type 2 diabetes mellitus with right diabetic foot ulcer [facility])    Rx / DC Orders ED Discharge Orders      None         Domenic Moras, PA-C 06/17/22 1533    Fredia Sorrow, MD 06/20/22 [phone]

## 2022-06-17 NOTE — ED Notes (Signed)
ED Provider at bedside. 

## 2022-06-17 NOTE — Progress Notes (Signed)
Pharmacy Antibiotic Note  Courtney Schneider is a 76 y.o. female admitted on 06/17/2022 with cellulitis.  Pharmacy has been consulted for vancomycin dosing.  Plan: Vancomycin 2000 mg IV x 1 dose. Vancomycin 1000 mg IV every 48 hours Monitor labs, c/s, and vanco level as indicated.  Height: '5\' 4"'$  (162.6 cm) Weight: 113.4 kg (250 lb) IBW/kg (Calculated) : 54.7  Temp (24hrs), Avg:100.3 F (37.9 C), Min:100 F (37.8 C), Max:100.6 F (38.1 C)  Recent Labs  Lab 06/17/22 1314  WBC 20.9*  CREATININE 3.81*  LATICACIDVEN 3.0*    Estimated Creatinine Clearance: 16.2 mL/min (A) (by C-G formula based on SCr of 3.81 mg/dL (H)).    No Known Allergies  Antimicrobials this admission: Vanco 11/19 >> CTX 11/19  Flagyl 11/19  Microbiology results: 11/19 BCx: pending 11/19 UCx: pending    Thank you for allowing pharmacy to be a part of this patient's care.  Margot Ables, PharmD Clinical Pharmacist 06/17/2022 2:11 PM

## 2022-06-17 NOTE — Progress Notes (Signed)
Pt has arrived Hep 13.5

## 2022-06-17 NOTE — H&P (Signed)
History and Physical    Patient: Courtney Schneider DOB: 01-Sep-1945 DOA: 06/17/2022 DOS: the patient was seen and examined on 06/17/2022 PCP: Pablo Lawrence, NP  Patient coming from: Home - lives alone. Uses a walker/cane and WC    Chief Complaint: weakness  HPI: Courtney Schneider is a 76 y.o. female with medical history significant of T2DM, HTN,  CKD stage 4, CHF, anemia of CKD who presented to ED with complaints of generalized weakness for several weakness and increasing pain and swelling to her right foot ulcer. She has been sick for a few weeks. She hasn't been eating well, her legs have been more swollen and her daughter decided to bring her to the Ed. She has been having chills, but no recorded temperature at home since she doesn't have a thermometer. She has new wound on the bottom of her right foot. She has no long how long it has been there as she has neuropathy. The daughter is un aware of how long it has been there either as she just saw it today.   Denies any fever +chills, denies any vision changes/headaches, chest pain or palpitations, shortness of breath or cough, abdominal pain, N/V/D, dysuria. +leg swelling.    She does not smoke or drink   ER Course:  vitals: temp: 100 (tmax: 100.6), bp: 113/71, HR: 99, RR: 20, oxygen: 95% RA Pertinent labs: BUN: 54, creatinine: 3.81, lactic acid: 3.0>1.6, WBC: 20.9, hgb: 9.9,  CXR: no acute finding Right foot xray: no osseous erosions to localize acute OM In ED: started on vanc/rocephin and flagyl. Bolused 62m/kg. Cultures obtained. TRH asked to admit.    Review of Systems: As mentioned in the history of present illness. All other systems reviewed and are negative. Past Medical History:  Diagnosis Date   Diabetes mellitus without complication ([place]    Hypertension    Renal disorder    CKD stage 3   Past Surgical History:  Procedure Laterality Date   AMPUTATION TOE     CHOLECYSTECTOMY     Social History:   reports that she has never smoked. She has never used smokeless tobacco. She reports that she does not drink alcohol and does not use drugs.  No Known Allergies  History reviewed. No pertinent family history.  Prior to Admission medications   Medication Sig Start Date End Date Taking? Authorizing Provider  amLODipine-valsartan (EXFORGE) 10-160 MG tablet Take 1 tablet by mouth daily. Resume on 5/31 12/27/17   MBarton Dubois MD  aspirin EC 81 MG tablet Take 81 mg by mouth daily.    [provider]  ciprofloxacin (CIPRO) 500 MG tablet Take 500 mg by mouth 2 (two) times daily.    [provider]  furosemide (LASIX) 40 MG tablet Take 40 mg by mouth.    [provider]  insulin glargine (LANTUS) 100 UNIT/ML injection Inject 26 Units into the skin at bedtime.     [provider]  metoprolol (LOPRESSOR) 50 MG tablet Take 50 mg by mouth daily.    [provider]  Multiple Vitamins-Minerals (PRESERVISION AREDS PO) Take 1 tablet by mouth daily.    [provider]  pioglitazone (ACTOS) 15 MG tablet Take 15 mg by mouth daily.    [provider]  pravastatin (PRAVACHOL) 40 MG tablet Take 40 mg by mouth daily.    [provider]    Physical Exam: Vitals:   06/17/22 1730 06/17/22 1800 06/17/22 1830 06/17/22 1900  BP: 111/76 100/65 109/83 99/83  Pulse: 86 85 78 73  Resp: (!) 21 20 (!) 24 19  Temp:      TempSrc:      SpO2: 96% 96% 96% 97%  Weight:      Height:       General:  Appears calm and comfortable and is in NAD Eyes:  PERRL, EOMI, normal lids, iris ENT:  grossly normal hearing, lips & tongue, dry mucous membranes; no teeth Neck:  no LAD, masses or thyromegaly; no carotid bruits Cardiovascular:  RRR, no m/r/g. +LE pitting edema, R>L Respiratory:    occasional crackle in bases, with decreased air movement in bases. No wheezing. Normal respiratory effort. Abdomen:  soft, NT, ND, NABS Back:   normal alignment, no  CVAT Skin:  right plantar foot with 2.5cm ulcer that is probable to bone and foul smelling. No drainage or surrounding erythema.   Musculoskeletal:  grossly normal tone BUE/BLE, good ROM, Lower extremity:   foot exam per above. 2+ distal pulses. Psychiatric:  grossly normal mood and affect, speech fluent and appropriate, AOx3 Neurologic:  CN 2-12 grossly intact, moves all extremities in coordinated fashion, sensation intact   Radiological Exams on Admission: Independently reviewed - see discussion in A/P where applicable  DG CHEST PORT 1 VIEW  Result Date: 06/17/2022 CLINICAL DATA:  Acute respiratory failure.  Hypoxia. EXAM: PORTABLE CHEST 1 VIEW COMPARISON:  June 17, 2022 FINDINGS: Stable cardiomegaly. The hila and mediastinum are normal. Bilateral hazy opacities are identified, more focal in the bases, right greater than left. No pneumothorax. No nodule or mass. IMPRESSION: 1. Cardiomegaly. 2. Bilateral hazy opacities, more focal in the bases, right greater than left. Findings could represent pulmonary edema or multifocal infection. Recommend clinical correlation and follow-up to resolution. Electronically Signed   By: Dorise Bullion III M.D.   On: 06/17/2022 16:57   DG Foot 2 Views Right  Result Date: 06/17/2022 CLINICAL DATA:  Diabetic foot. EXAM: RIGHT FOOT - 2 VIEW COMPARISON:  None Available. FINDINGS: Post amputation midfoot level. Gas ulceration in the stomach. Osseous erosion. Potential skin ulceration posterior to the calcaneus. No subcutaneous gas identified. No osseous erosion. IMPRESSION: No osseous erosions to localize acute osteomyelitis. Electronically Signed   By: Suzy Bouchard M.D.   On: 06/17/2022 14:24   DG Chest 2 View  Result Date: 06/17/2022 CLINICAL DATA:  Suspected Sepsis EXAM: CHEST - 2 VIEW COMPARISON:  July 10, 2021 FINDINGS: The cardiomediastinal silhouette is unchanged in contour.Atherosclerotic calcifications. No pleural effusion. No pneumothorax.  No acute pleuroparenchymal abnormality. Visualized abdomen is unremarkable. Multilevel degenerative changes of the thoracic spine. IMPRESSION: No acute cardiopulmonary abnormality. Electronically Signed   By: Valentino Saxon M.D.   On: 06/17/2022 12:51    EKG: Independently reviewed.  NSR with rate 89; nonspecific ST changes with no evidence of acute ischemia Original EKG with artifact and undetermined rhythm. Rate of 108.    Labs on Admission: I have personally reviewed the available labs and imaging studies at the time of the admission.  Pertinent labs:   BUN: 54, creatinine: 3.81,  lactic acid: 3.0>1.6,  WBC: 20.9,  hgb: 9.9,   Assessment and Plan: Principal Problem:   Sepsis secondary to right foot ulcer Active Problems:   Acute respiratory failure with hypoxia (HCC)   Acute renal failure superimposed on stage 4 chronic kidney disease (HCC)   Insulin dependent type 2 diabetes mellitus (HCC)   Chronic diastolic CHF (congestive heart failure) (HCC)   HTN (hypertension)   HLD (hyperlipidemia)   Right foot  ulcer ([place])    Assessment and Plan: * Sepsis secondary to right foot ulcer 76 year old presenting with 3 week history of general feeling ill, poor PO intake, weakness found to be septic from right foot ulcer that is foul smelling, probable to bone  -admit to progressive unit at [facility] -continue vanc/rocephin and flagyl -check inflammatory markers -check right foot MRI  -BC pending, UA still pending  -foot xray with no evidence of OM, but exam and clinical status says otherwise. Concern will need surgical intervention. Dr. Sharol Given will see in the AM -tylenol/norco for pain. Has neuropathy so not much feeling in foot     Acute respiratory failure with hypoxia (HCC) Was satting 97% on RA and acutely became hypoxic to 80% and tachypneic but denied any shortness of breath Requiring 6L to maintain saturation -check BNP and repeat CXR stat -give '20mg'$  lasix stat (has  received nearly 2L IVF) -stop IVF and put at 78m/hour -start heparin gtt in setting of sepsis, LE edema and acute respiratory failure for probably PE. Can not CTA due to renal function. D-dimer will likely be elevated in setting of chronic renal disease+sepsis, but will check -check bilateral DVT doppler/echo -VQ scan per day team    Acute renal failure superimposed on stage 4 chronic kidney disease ([place] Labs from 03/2022 show her creatinine at 3.10, up to 3.8 today Likely secondary to sepsis UA pending Strict I/O Hold nephrotoxic drugs Received fluid resus, continue maintenance IVF Trend   Insulin dependent type 2 diabetes mellitus ([place] Well controlled. A1C in 03/2022 was 6.5 Has not had her insulin in past few days. Decrease dose down to 10 units daily (normally on 26 units)  Sensitive SSI and accuchecks qac/hs   Chronic diastolic CHF (congestive heart failure) (HCC) Last echo in 2018 and normal EF with grade 1 DD With what appears to be acute pulmonary edema for fluid IVF resus vs. PE vs. Acute on chronic diastolic CHF repeat echo ordered Strict I/O   HTN (hypertension) Has been normotensive to elevated Continue metoprolol, but hold if becomes hypotensive   HLD (hyperlipidemia) Continue pravastatin    -checked on her later on in evening and oxygen down to 4L at 98%. Will let her eat now as she is comfortable and satting well. Continue to wean oxygen as tolerated.   Advance Care Planning:   Code Status: DNR   Consults: ortho   DVT Prophylaxis: heparin gtt  Family Communication: daughter at bedside, amanda   Severity of Illness: The appropriate patient status for this patient is INPATIENT. Inpatient status is judged to be reasonable and necessary in order to provide the required intensity of service to ensure the patient's safety. The patient's presenting symptoms, physical exam findings, and initial radiographic and laboratory data in the context of their chronic  comorbidities is felt to place them at high risk for further clinical deterioration. Furthermore, it is not anticipated that the patient will be medically stable for discharge from the hospital within 2 midnights of admission.   * I certify that at the point of admission it is my clinical judgment that the patient will require inpatient hospital care spanning beyond 2 midnights from the point of admission due to high intensity of service, high risk for further deterioration and high frequency of surveillance required.*  Author: AOrma Flaming MD 06/17/2022 8:44 PM  For on call review [email or web address]

## 2022-06-17 NOTE — Progress Notes (Signed)
ANTICOAGULATION CONSULT NOTE - Initial Consult  Pharmacy Consult for heparin Indication: pulmonary embolus  No Known Allergies  Patient Measurements: Height: '5\' 4"'$  (162.6 cm) Weight: 113.4 kg (250 lb) IBW/kg (Calculated) : 54.7 Heparin Dosing Weight: 82 kg   Vital Signs: Temp: 100.6 F (38.1 C) (11/19 1357) Temp Source: Rectal (11/19 1357) BP: 150/59 (11/19 1600) Pulse Rate: 89 (11/19 1616)  Labs: Recent Labs    06/17/22 1314  HGB 9.9*  HCT 31.3*  PLT 376  APTT 34  LABPROT 14.9  INR 1.2  CREATININE 3.81*    Estimated Creatinine Clearance: 16.2 mL/min (A) (by C-G formula based on SCr of 3.81 mg/dL (H)).   Medical History: Past Medical History:  Diagnosis Date   Diabetes mellitus without complication ([place])    Hypertension    Renal disorder    CKD stage 3    Medications:  (Not in a hospital admission)   Assessment: Pharmacy consulted to dose heparin in patient with pulmonary embolism.  Patient is not on anticoagulation prior to admission  CBC WNL  Goal of Therapy:  Heparin level 0.3-0.7 units/ml Monitor platelets by anticoagulation protocol: Yes   Plan:  Give 5000 units bolus x 1 Start heparin infusion at 1350 units/hr Heparin level in 8 hours and daily Continue to monitor H&H and platelets.  Ramond Craver 06/17/2022,4:44 PM

## 2022-06-17 NOTE — Assessment & Plan Note (Addendum)
76 year old presenting with 3 week history of general feeling ill, poor PO intake, weakness found to be septic from right foot ulcer that is foul smelling, probable to bone  -admit to progressive unit at [facility] -continue vanc/rocephin and flagyl -check inflammatory markers -check right foot MRI  -BC pending, UA still pending  -foot xray with no evidence of OM, but exam and clinical status says otherwise. Concern will need surgical intervention. Dr. Sharol Given will see in the AM -tylenol/norco for pain. Has neuropathy so not much feeling in foot

## 2022-06-17 NOTE — Assessment & Plan Note (Signed)
Labs from 03/2022 show her creatinine at 3.10, up to 3.8 today Likely secondary to sepsis UA pending Strict I/O Hold nephrotoxic drugs Received fluid resus, continue maintenance IVF Trend

## 2022-06-18 ENCOUNTER — Inpatient Hospital Stay (HOSPITAL_COMMUNITY): Payer: Medicare Other

## 2022-06-18 DIAGNOSIS — I5031 Acute diastolic (congestive) heart failure: Secondary | ICD-10-CM | POA: Diagnosis not present

## 2022-06-18 DIAGNOSIS — R52 Pain, unspecified: Secondary | ICD-10-CM

## 2022-06-18 DIAGNOSIS — A419 Sepsis, unspecified organism: Secondary | ICD-10-CM | POA: Diagnosis not present

## 2022-06-18 DIAGNOSIS — M86271 Subacute osteomyelitis, right ankle and foot: Secondary | ICD-10-CM

## 2022-06-18 DIAGNOSIS — R609 Edema, unspecified: Secondary | ICD-10-CM | POA: Diagnosis not present

## 2022-06-18 DIAGNOSIS — R6520 Severe sepsis without septic shock: Secondary | ICD-10-CM | POA: Diagnosis not present

## 2022-06-18 LAB — URINALYSIS, ROUTINE W REFLEX MICROSCOPIC
Bilirubin Urine: NEGATIVE
Glucose, UA: NEGATIVE mg/dL
Ketones, ur: NEGATIVE mg/dL
Nitrite: NEGATIVE
Protein, ur: 30 mg/dL — AB
Specific Gravity, Urine: 1.006 (ref 1.005–1.030)
pH: 5 (ref 5.0–8.0)

## 2022-06-18 LAB — COMPREHENSIVE METABOLIC PANEL
ALT: 7 U/L (ref 0–44)
AST: 16 U/L (ref 15–41)
Albumin: 2.3 g/dL — ABNORMAL LOW (ref 3.5–5.0)
Alkaline Phosphatase: 71 U/L (ref 38–126)
Anion gap: 13 (ref 5–15)
BUN: 51 mg/dL — ABNORMAL HIGH (ref 8–23)
CO2: 17 mmol/L — ABNORMAL LOW (ref 22–32)
Calcium: 8.6 mg/dL — ABNORMAL LOW (ref 8.9–10.3)
Chloride: 104 mmol/L (ref 98–111)
Creatinine, Ser: 3.62 mg/dL — ABNORMAL HIGH (ref 0.44–1.00)
GFR, Estimated: 13 mL/min — ABNORMAL LOW (ref >60)
Glucose, Bld: 134 mg/dL — ABNORMAL HIGH (ref 70–99)
Potassium: 4.3 mmol/L (ref 3.5–5.1)
Sodium: 134 mmol/L — ABNORMAL LOW (ref 135–145)
Total Bilirubin: 0.4 mg/dL (ref 0.3–1.2)
Total Protein: 5.9 g/dL — ABNORMAL LOW (ref 6.5–8.1)

## 2022-06-18 LAB — GLUCOSE, CAPILLARY
Glucose-Capillary: 128 mg/dL — ABNORMAL HIGH (ref 70–99)
Glucose-Capillary: 131 mg/dL — ABNORMAL HIGH (ref 70–99)
Glucose-Capillary: 148 mg/dL — ABNORMAL HIGH (ref 70–99)

## 2022-06-18 LAB — ECHOCARDIOGRAM COMPLETE
AR max vel: 1.53 cm2
AV Area VTI: 1.55 cm2
AV Area mean vel: 1.49 cm2
AV Mean grad: 12 mmHg
AV Peak grad: 19.4 mmHg
Ao pk vel: 2.21 m/s
Area-P 1/2: 3.21 cm2
Calc EF: 54.1 %
Height: 64 in
S' Lateral: 3.3 cm
Single Plane A2C EF: 71.6 %
Single Plane A4C EF: 32.1 %
Weight: 4000 oz

## 2022-06-18 LAB — CBC
HCT: 29.2 % — ABNORMAL LOW (ref 36.0–46.0)
Hemoglobin: 9.1 g/dL — ABNORMAL LOW (ref 12.0–15.0)
MCH: 30.5 pg (ref 26.0–34.0)
MCHC: 31.2 g/dL (ref 30.0–36.0)
MCV: 98 fL (ref 80.0–100.0)
Platelets: 353 10*3/uL (ref 150–400)
RBC: 2.98 MIL/uL — ABNORMAL LOW (ref 3.87–5.11)
RDW: 12.5 % (ref 11.5–15.5)
WBC: 22.6 10*3/uL — ABNORMAL HIGH (ref 4.0–10.5)
nRBC: 0 % (ref 0.0–0.2)

## 2022-06-18 LAB — HEPARIN LEVEL (UNFRACTIONATED)
Heparin Unfractionated: 0.26 IU/mL — ABNORMAL LOW (ref 0.30–0.70)
Heparin Unfractionated: 0.57 IU/mL (ref 0.30–0.70)

## 2022-06-18 MED ORDER — SODIUM BICARBONATE 650 MG PO TABS
1300.0000 mg | ORAL_TABLET | Freq: Two times a day (BID) | ORAL | Status: DC
  Administered 2022-06-18 (×2): 1300 mg via ORAL
  Filled 2022-06-18 (×2): qty 2

## 2022-06-18 MED ORDER — POVIDONE-IODINE 10 % EX SWAB: 2.0000 | Freq: Once | CUTANEOUS | Status: DC

## 2022-06-18 MED ORDER — FUROSEMIDE 10 MG/ML IJ SOLN: 40.0000 mg | Freq: Once | INTRAMUSCULAR | Status: DC

## 2022-06-18 MED ORDER — FUROSEMIDE 10 MG/ML IJ SOLN
60.0000 mg | Freq: Once | INTRAMUSCULAR | Status: AC
  Administered 2022-06-18: 60 mg via INTRAVENOUS
  Filled 2022-06-18: qty 6

## 2022-06-18 MED ORDER — HEPARIN SODIUM (PORCINE) 5000 UNIT/ML IJ SOLN
5000.0000 [IU] | Freq: Three times a day (TID) | INTRAMUSCULAR | Status: DC
  Administered 2022-06-18 – 2022-06-19 (×3): 5000 [IU] via SUBCUTANEOUS
  Filled 2022-06-18 (×3): qty 1

## 2022-06-18 MED ORDER — ORAL CARE MOUTH RINSE: 15.0000 mL | OROMUCOSAL | Status: DC | PRN

## 2022-06-18 MED ORDER — HEPARIN BOLUS VIA INFUSION
2000.0000 [IU] | Freq: Once | INTRAVENOUS | Status: AC
  Administered 2022-06-18: 2000 [IU] via INTRAVENOUS
  Filled 2022-06-18: qty 2000

## 2022-06-18 MED ORDER — CHLORHEXIDINE GLUCONATE 4 % EX LIQD
60.0000 mL | Freq: Once | CUTANEOUS | Status: DC
  Filled 2022-06-18: qty 60

## 2022-06-18 MED ORDER — MELATONIN 5 MG PO TABS
5.0000 mg | ORAL_TABLET | Freq: Every day | ORAL | Status: DC
  Administered 2022-06-18 – 2022-06-20 (×2): 5 mg via ORAL
  Filled 2022-06-18 (×3): qty 1

## 2022-06-18 MED ORDER — SODIUM CHLORIDE 0.9 % IV SOLN
2.0000 g | INTRAVENOUS | Status: DC
  Administered 2022-06-18 – 2022-06-20 (×3): 2 g via INTRAVENOUS
  Filled 2022-06-18 (×3): qty 12.5

## 2022-06-18 MED ORDER — TRAZODONE HCL 50 MG PO TABS
50.0000 mg | ORAL_TABLET | Freq: Every day | ORAL | Status: DC
  Administered 2022-06-18 – 2022-06-22 (×2): 50 mg via ORAL
  Filled 2022-06-18 (×3): qty 1

## 2022-06-18 MED ORDER — PERFLUTREN LIPID MICROSPHERE
1.0000 mL | INTRAVENOUS | Status: AC | PRN
  Administered 2022-06-18: 2 mL via INTRAVENOUS

## 2022-06-18 MED ORDER — TECHNETIUM TO 99M ALBUMIN AGGREGATED
4.1000 | Freq: Once | INTRAVENOUS | Status: AC | PRN
  Administered 2022-06-18: 4.1 via INTRAVENOUS

## 2022-06-18 NOTE — Progress Notes (Addendum)
PROGRESS NOTE    MARIAPAULA KRIST  XNA:[id number] DOB: 01-15-1946 DOA: 06/17/2022 PCP: Pablo Lawrence, NP   Chief Complaint  Patient presents with   Code Sepsis    Brief Narrative:   ANGELICE PIECH is a 76 y.o. female with medical history significant of T2DM, HTN,  CKD stage 4, CHF, anemia of CKD who presented to ED with complaints of generalized weakness for several weakness and increasing pain and swelling to her right foot ulcer. She has been sick for a few weeks. She hasn't been eating well, her legs have been more swollen and her daughter decided to bring her to the Ed. She has been having chills, it is neuropathic, daughter noted large ulcer right foot, brought her to ED    Assessment & Plan:   Principal Problem:   Sepsis secondary to right foot ulcer Active Problems:   Acute respiratory failure with hypoxia (HCC)   Acute renal failure superimposed on stage 4 chronic kidney disease (HCC)   Insulin dependent type 2 diabetes mellitus (HCC)   Chronic diastolic CHF (congestive heart failure) (HCC)   HTN (hypertension)   HLD (hyperlipidemia)   Right foot ulcer (HCC)   Subacute osteomyelitis, right ankle and foot (HCC)   Severe sepsis present on admission due to right foot ulcer /acute osteomyelitis  76 year old presenting with 3 week history of general feeling ill, poor PO intake, weakness found to be septic from right foot ulcer that is foul smelling, RI confirms osteomyelitis. -Continue with IV vancomycin and cefepime -Follow-up blood cultures -Orthopedic input greatly appreciated, plan for BKA on Wednesday -Continue with as needed pain medications    Acute respiratory failure with hypoxia ([place]) likely related to volume overload and possible pneumonia - Was satting 97% on RA and acutely became hypoxic to 80% and tachypneic but denied any shortness of breath Requiring 6L to maintain saturation -Events of volume overload, continue with IV diuresis -Incentive  spirometer -VQ scan with no evidence of PE, will DC her heparin drip -Continue with IV diuresis as tolerated -Need to treat for bibasilar pneumonia   Acute renal failure superimposed on stage 4 chronic kidney disease ([place]) Labs from 03/2022 show her creatinine at 3.10, up to 3.8 today Likely secondary to sepsis Hold nephrotoxic drugs Closely as on IV diuresis Continue with sodium bicarb  Insulin dependent type 2 diabetes mellitus ([place]) Well controlled. A1C in 03/2022 was 6.5 Has not had her insulin in past few days. Decrease dose down to 10 units daily (normally on 26 units)  Sensitive SSI and accuchecks qac/hs    Acute on chronic diastolic CHF (congestive heart failure) (HCC) Last echo in 2018 and normal EF with grade 1 DD With what appears to be acute pulmonary edema for fluid IVF resus vs. PE vs. Acute on chronic diastolic CHF repeat echo ordered Strict I/O    HTN (hypertension) Has been normotensive to elevated Continue metoprolol, but hold if becomes hypotensive    HLD (hyperlipidemia) Continue pravastatin    Morbid obesity Body mass index is 42.91 kg/m.    DVT prophylaxis: Heparin Code Status: Full code Family Communication: D/W daughter at bedside Disposition:   Status is: Inpatient    Consultants:  Orthopedic Dr Sharol Given   Subjective:  Patient with respiratory distress overnight, requiring increased oxygen, complains of pain on the right foot.  Objective: Vitals:   06/18/22 0328 06/18/22 0434 06/18/22 0512 06/18/22 1215  BP: (!) 132/51 (!) 161/65  139/69  Pulse: 68 71 73  Resp: (!) 23 (!) 23 (!) 25   Temp: 98.2 F (36.8 C) 98.5 F (36.9 C)  98.8 F (37.1 C)  TempSrc: Oral Oral  Oral  SpO2: (!) 87% 90% 91% (!) 86%  Weight:      Height:        Intake/Output Summary (Last 24 hours) at 06/18/2022 1350 Last data filed at 06/18/2022 0600 Gross per 24 hour  Intake 3013.17 ml  Output 850 ml  Net 2163.17 ml   Filed Weights   06/17/22 1225  Weight:  113.4 kg    Examination:  Awake Alert, Oriented X 3, extremely frail, chronically ill-appearing. Symmetrical Chest wall movement, air entry at the bases with scattered Rales RRR,No Gallops,Rubs or new Murmurs, No Parasternal Heave +ve B.Sounds, Abd Soft, No tenderness, No rebound - guarding or rigidity. Status post TMA, with plantar foot ulcer, foul-smelling odor.    Data Reviewed: I have personally reviewed following labs and imaging studies  CBC: Recent Labs  Lab 06/17/22 1314 06/18/22 0105  WBC 20.9* 22.6*  NEUTROABS 18.0*  --   HGB 9.9* 9.1*  HCT 31.3* 29.2*  MCV 96.3 98.0  PLT 376 782    Basic Metabolic Panel: Recent Labs  Lab 06/17/22 1314 06/18/22 0105  NA 134* 134*  K 4.7 4.3  CL 105 104  CO2 16* 17*  GLUCOSE 168* 134*  BUN 54* 51*  CREATININE 3.81* 3.62*  CALCIUM 8.8* 8.6*    GFR: Estimated Creatinine Clearance: 16.8 mL/min (A) (by C-G formula based on SCr of 3.62 mg/dL (H)).  Liver Function Tests: Recent Labs  Lab 06/17/22 1314 06/18/22 0105  AST 16 16  ALT 8 7  ALKPHOS 87 71  BILITOT 0.6 0.4  PROT 7.3 5.9*  ALBUMIN 3.2* 2.3*    CBG: Recent Labs  Lab 06/17/22 1229 06/17/22 1834 06/17/22 2341 06/18/22 1213  GLUCAP 162* 162* 132* 128*     Recent Results (from the past 240 hour(s))  Resp Panel by RT-PCR (Flu A&B, Covid) Anterior Nasal Swab     Status: None   Collection Time: 06/17/22  1:07 PM   Specimen: Anterior Nasal Swab  Result Value Ref Range Status   SARS Coronavirus 2 by RT PCR NEGATIVE NEGATIVE Final    Comment: (NOTE) SARS-CoV-2 target nucleic acids are NOT DETECTED.  The SARS-CoV-2 RNA is generally detectable in upper respiratory specimens during the acute phase of infection. The lowest concentration of SARS-CoV-2 viral copies this assay can detect is 138 copies/mL. A negative result does not preclude SARS-Cov-2 infection and should not be used as the sole basis for treatment or other patient management decisions. A  negative result may occur with  improper specimen collection/handling, submission of specimen other than nasopharyngeal swab, presence of viral mutation(s) within the areas targeted by this assay, and inadequate number of viral copies(<138 copies/mL). A negative result must be combined with clinical observations, patient history, and epidemiological information. The expected result is Negative.  Fact Sheet for Patients:  EntrepreneurPulse.com.au  Fact Sheet for Healthcare Providers:  IncredibleEmployment.be  This test is no t yet approved or cleared by the [place] FDA and  has been authorized for detection and/or diagnosis of SARS-CoV-2 by FDA under an Emergency Use Authorization (EUA). This EUA will remain  in effect (meaning this test can be used) for the duration of the COVID-19 declaration under Section 564(b)(1) of the Act, 21 U.S.C.section 360bbb-3(b)(1), unless the authorization is terminated  or revoked sooner.       Influenza A  by PCR NEGATIVE NEGATIVE Final   Influenza B by PCR NEGATIVE NEGATIVE Final    Comment: (NOTE) The Xpert Xpress SARS-CoV-2/FLU/RSV plus assay is intended as an aid in the diagnosis of influenza from Nasopharyngeal swab specimens and should not be used as a sole basis for treatment. Nasal washings and aspirates are unacceptable for Xpert Xpress SARS-CoV-2/FLU/RSV testing.  Fact Sheet for Patients: EntrepreneurPulse.com.au  Fact Sheet for Healthcare Providers: IncredibleEmployment.be  This test is not yet approved or cleared by the [place] FDA and has been authorized for detection and/or diagnosis of SARS-CoV-2 by FDA under an Emergency Use Authorization (EUA). This EUA will remain in effect (meaning this test can be used) for the duration of the COVID-19 declaration under Section 564(b)(1) of the Act, 21 U.S.C. section 360bbb-3(b)(1), unless the authorization  is terminated or revoked.  Performed at [facility], [address] [place], [place] [address]   Culture, blood (Routine x 2)     Status: None (Preliminary result)   Collection Time: 06/17/22  1:14 PM   Specimen: Right Antecubital; Blood  Result Value Ref Range Status   Specimen Description   Final    RIGHT ANTECUBITAL BOTTLES DRAWN AEROBIC AND ANAEROBIC   Special Requests Blood Culture adequate volume  Final   Culture   Final    NO GROWTH < 12 HOURS Performed at [facility], [address], [place], [place] [address]    Report Status PENDING  Incomplete  Culture, blood (Routine x 2)     Status: None (Preliminary result)   Collection Time: 06/17/22  1:14 PM   Specimen: Left Antecubital; Blood  Result Value Ref Range Status   Specimen Description   Final    LEFT ANTECUBITAL BOTTLES DRAWN AEROBIC AND ANAEROBIC   Special Requests   Final    Blood Culture results may not be optimal due to an excessive volume of blood received in culture bottles   Culture   Final    NO GROWTH < 12 HOURS Performed at [facility], [address] [place], [place] [address]    Report Status PENDING  Incomplete         Radiology Studies: NM Pulmonary Perfusion  Result Date: 06/18/2022 CLINICAL DATA:  77 year old female with shortness of breath. EXAM: NUCLEAR MEDICINE PERFUSION LUNG SCAN TECHNIQUE: Perfusion images were obtained in multiple projections after intravenous injection of radiopharmaceutical. Ventilation scans intentionally deferred if perfusion scan and chest x-ray adequate for interpretation during COVID 19 epidemic. RADIOPHARMACEUTICALS:  4.1 mCi Tc-25mMAA IV COMPARISON:  Portable chest x-ray yesterday 1647 hours. FINDINGS: Homogeneous perfusion radiotracer activity in both lungs when accounting for mediastinal - and on the lateral views upper extremity related photopenia. No convincing perfusion defect. IMPRESSION: Normal perfusion, no evidence of pulmonary embolus. Electronically  Signed   By: HGenevie AnnM.D.   On: 06/18/2022 10:16   MR FOOT RIGHT WO CONTRAST  Result Date: 06/18/2022 CLINICAL DATA:  Diabetic right foot ulcer. History of prior amputation. EXAM: MRI OF THE RIGHT FOREFOOT WITHOUT CONTRAST TECHNIQUE: Multiplanar, multisequence MR imaging of the right foot was performed. No intravenous contrast was administered. COMPARISON:  Right foot x-rays from same day. MRI right foot dated Dec 24, 2017. FINDINGS: Bones/Joint/Cartilage Prior transmetatarsal amputation. Abnormal marrow edema with early decreased T1 marrow signal involving the residual fourth and fifth metatarsal bases. No fracture or dislocation. Joint spaces are preserved. No joint effusion. Ligaments Medial and lateral ankle ligaments are grossly intact. Muscles and Tendons No tenosynovitis. Soft tissue Lateral plantar  midfoot soft tissue ulceration adjacent to the residual fifth metatarsal base with subcutaneous emphysema but no fluid collection. Diffuse soft tissue swelling. No soft tissue mass. IMPRESSION: 1. Lateral plantar midfoot soft tissue ulceration adjacent to the residual fifth metatarsal base with underlying osteomyelitis of the residual fourth and fifth metatarsal bases. No abscess. Electronically Signed   By: Titus Dubin M.D.   On: 06/18/2022 07:53   DG CHEST PORT 1 VIEW  Result Date: 06/17/2022 CLINICAL DATA:  Acute respiratory failure.  Hypoxia. EXAM: PORTABLE CHEST 1 VIEW COMPARISON:  June 17, 2022 FINDINGS: Stable cardiomegaly. The hila and mediastinum are normal. Bilateral hazy opacities are identified, more focal in the bases, right greater than left. No pneumothorax. No nodule or mass. IMPRESSION: 1. Cardiomegaly. 2. Bilateral hazy opacities, more focal in the bases, right greater than left. Findings could represent pulmonary edema or multifocal infection. Recommend clinical correlation and follow-up to resolution. Electronically Signed   By: Dorise Bullion III M.D.   On: 06/17/2022 16:57    DG Foot 2 Views Right  Result Date: 06/17/2022 CLINICAL DATA:  Diabetic foot. EXAM: RIGHT FOOT - 2 VIEW COMPARISON:  None Available. FINDINGS: Post amputation midfoot level. Gas ulceration in the stomach. Osseous erosion. Potential skin ulceration posterior to the calcaneus. No subcutaneous gas identified. No osseous erosion. IMPRESSION: No osseous erosions to localize acute osteomyelitis. Electronically Signed   By: Suzy Bouchard M.D.   On: 06/17/2022 14:24   DG Chest 2 View  Result Date: 06/17/2022 CLINICAL DATA:  Suspected Sepsis EXAM: CHEST - 2 VIEW COMPARISON:  July 10, 2021 FINDINGS: The cardiomediastinal silhouette is unchanged in contour.Atherosclerotic calcifications. No pleural effusion. No pneumothorax. No acute pleuroparenchymal abnormality. Visualized abdomen is unremarkable. Multilevel degenerative changes of the thoracic spine. IMPRESSION: No acute cardiopulmonary abnormality. Electronically Signed   By: Valentino Saxon M.D.   On: 06/17/2022 12:51        Scheduled Meds:  atorvastatin  10 mg Oral Daily   insulin aspart  0-9 Units Subcutaneous TID WC   insulin glargine-yfgn  10 Units Subcutaneous QHS   melatonin  5 mg Oral QHS   metoprolol tartrate  50 mg Oral Daily   sodium bicarbonate  650 mg Oral Daily   traZODone  50 mg Oral QHS   Continuous Infusions:  ceFEPime (MAXIPIME) IV 2 g (06/18/22 1028)   heparin 1,500 Units/hr (06/18/22 0617)   metronidazole 500 mg (06/18/22 0322)   [START ON 06/19/2022] vancomycin       LOS: 1 day        Phillips Climes, MD [facility]

## 2022-06-18 NOTE — Progress Notes (Signed)
Patient refusing ABG at this time. RN aware.

## 2022-06-18 NOTE — Progress Notes (Addendum)
  Transition of Care [facility]) Screening Note   Patient Details  Name: Courtney Schneider Date of Birth: 1946/03/04   Transition of Care [facility]) CM/SW Contact:    Cyndi Bender, RN Phone Number: 06/18/2022, 8:51 AM    Transition of Care Department [facility]) has reviewed patient and no TOC needs have been identified at this time. We will continue to monitor patient advancement through interdisciplinary progression rounds. If new patient transition needs arise, please place a TOC consult.  Patient is scheduled for Below the knee amputation 06/20/22.

## 2022-06-18 NOTE — Progress Notes (Signed)
  Echocardiogram 2D Echocardiogram has been performed.  Courtney Schneider 06/18/2022, 4:20 PM

## 2022-06-18 NOTE — Progress Notes (Signed)
ANTICOAGULATION CONSULT NOTE - Initial Consult  Pharmacy Consult for Heparin Indication: Rule out pulmonary embolus  No Known Allergies  Patient Measurements: Height: '5\' 4"'$  (162.6 cm) Weight: 113.4 kg (250 lb) IBW/kg (Calculated) : 54.7   Vital Signs: Temp: 98.2 F (36.8 C) (11/19 2000) Temp Source: Oral (11/19 2000) BP: 90/59 (11/19 2000) Pulse Rate: 63 (11/19 2000)  Labs: Recent Labs    06/17/22 1314 06/18/22 0105  HGB 9.9* 9.1*  HCT 31.3* 29.2*  PLT 376 353  APTT 34  --   LABPROT 14.9  --   INR 1.2  --   HEPARINUNFRC  --  0.26*  CREATININE 3.81*  --     Estimated Creatinine Clearance: 16 mL/min (A) (by C-G formula based on SCr of 3.81 mg/dL (H)).   Medical History: Past Medical History:  Diagnosis Date   Diabetes mellitus without complication ([place])    Hypertension    Renal disorder    CKD stage 3      Assessment: 76 y/o F on heparin for rule out PE, looks like plan is for VQ but one isn't currently ordered, has venous duplex ordered, heparin level is just below goal  Goal of Therapy:  Heparin level 0.3-0.7 units/ml Monitor platelets by anticoagulation protocol: Yes   Plan:  Heparin 2000 units bolus Inc heparin to 1500 units/hr 1000 heparin level  Narda Bonds, PharmD, BCPS Clinical Pharmacist Phone: [phone]

## 2022-06-18 NOTE — Progress Notes (Signed)
Patient developed increased oxygen requirement and confusion tonight.   She is being treated for acute CHF and possible pneumonia.   She has been started on BiPAP. Plan to check ABG, give additional Lasix, continue BiPAP overnight.

## 2022-06-18 NOTE — Progress Notes (Signed)
ANTICOAGULATION CONSULT NOTE - follow up  Pharmacy Consult for Heparin Indication: Rule out pulmonary embolus  No Known Allergies  Patient Measurements: Height: '5\' 4"'$  (162.6 cm) Weight: 113.4 kg (250 lb) IBW/kg (Calculated) : 54.7   Vital Signs: Temp: 98.8 F (37.1 C) (11/20 1215) Temp Source: Oral (11/20 1215) BP: 139/69 (11/20 1215) Pulse Rate: 73 (11/20 0512)  Labs: Recent Labs    06/17/22 1314 06/18/22 0105 06/18/22 1056  HGB 9.9* 9.1*  --   HCT 31.3* 29.2*  --   PLT 376 353  --   APTT 34  --   --   LABPROT 14.9  --   --   INR 1.2  --   --   HEPARINUNFRC  --  0.26* 0.57  CREATININE 3.81* 3.62*  --      Estimated Creatinine Clearance: 16.8 mL/min (A) (by C-G formula based on SCr of 3.62 mg/dL (H)).   Medical History: Past Medical History:  Diagnosis Date   Diabetes mellitus without complication ([place])    Hypertension    Renal disorder    CKD stage 3     Assessment: 76 y/o F on heparin for rule out PE, looks like plan is for VQ but one isn't currently ordered, has venous duplex ordered,   Initial 8 hour heparin level was just below goal , thus heparin rate increased to 1500 units/hr.   Next 8 hr heparin level is now 0.57, therapeutic on heparin 1500 units/hr. Anemia of CKD IV,  Hgb 9.1 stable, pltc wnl stable. No bleeding reported.  VQ scan 06/18/22 : no evidence of pulmonary embolus.  Venous LE duplex 06/18/22:   pending   Goal of Therapy:  Heparin level 0.3-0.7 units/ml Monitor platelets by anticoagulation protocol: Yes   Plan:  Continue heparin infusion 1500 units/hr Check 6- 8 hour heparin level to confirm remains therapeutic, if heparin continued Daily HL and CBC while on IV heparin. Follow up venous duplex study results.    Nicole Cella, RPh Clinical Pharmacist [phone] 06/18/2022 12:40 PM Please check AMION for all [place] phone numbers After 10:00 PM, call [place] [phone]

## 2022-06-18 NOTE — Progress Notes (Signed)
Patient has increased oxygen requirement, tachypnea, and dyspnea with speech.   She was admitted yesterday evening for sepsis secondary to right foot infection with AKI on CKD IV, developed acute hypoxic respiratory failure after fluid-resuscitation in ED, and had CXR findings consistent with pulmonary edema.   She received 20 mg IV Lasix yesterday evening by admitting physician, echo was ordered, and IV heparin started while ruling-out VTE.   Plan to give additional Lasix now.

## 2022-06-18 NOTE — Progress Notes (Signed)
Pharmacy Antibiotic Note  BRYN SALINE is a 76 y.o. female admitted on 06/17/2022 with cellulitis.  Pharmacy  was  consulted  on 06/17/22 for vancomycin dosing. Today 11/20 pharmacy consulted to dose Cefepime for HCAP and discontinue Ceftriaxone.  Plan: Discontinue Ceftriaxone Start Cefepime 2g IV q24hr Continue Vancomycin 1000 mg IV every 48 hours Monitor renal function, cultures, and vanco level as indicated.  Height: '5\' 4"'$  (162.6 cm) Weight: 113.4 kg (250 lb) IBW/kg (Calculated) : 54.7  Temp (24hrs), Avg:98.9 F (37.2 C), Min:98.1 F (36.7 C), Max:100.6 F (38.1 C)  Recent Labs  Lab 06/17/22 1314 06/17/22 1448 06/18/22 0105  WBC 20.9*  --  22.6*  CREATININE 3.81*  --  3.62*  LATICACIDVEN 3.0* 1.6  --      Estimated Creatinine Clearance: 16.8 mL/min (A) (by C-G formula based on SCr of 3.62 mg/dL (H)).    No Known Allergies  Antimicrobials this admission: Vanco 11/19 >> CTX 11/19 >11/20 Flagyl 11/19 Cefepime 11/20>>  Microbiology results: 11/19 BCx: ngtd 11/19 UCx: pending  11/19 COVID : neg:  flu: neg   Thank you for allowing pharmacy to be a part of this patient's care.  Nicole Cella, RPh Clinical Pharmacist [phone] 06/18/2022 11:46 AM Please check AMION for all [place] phone numbers After 10:00 PM, call [place] [phone]

## 2022-06-18 NOTE — Progress Notes (Signed)
Pt admitted last night from [facility] for sepsis r/t right foot diabetic ulcer and acute respiratory failure. She received 2L of fluids in [facility] ED and had '20mg'$  of lasix. She put out about 565m of urine since than. Up until now pt was able to maintain her SpO2 above 92% on 5 L, now she stays in 87-89 range on 5 L, she seems to be short of breath while talking.  Above note sent to [place], MD. New order for Lasix '60mg'$  placed.

## 2022-06-18 NOTE — Consult Note (Signed)
ORTHOPAEDIC CONSULTATION  REQUESTING PHYSICIAN: Elgergawy, Silver Huguenin, MD  Chief Complaint: Abscess purulent drainage ulcer right transmetatarsal amputation.  HPI: Courtney Schneider is a 76 y.o. female who presents with abscess and purulent drainage ulcer with exposed bone right transmetatarsal amputation.  Patient is unsure of when this started.  Past Medical History:  Diagnosis Date   Diabetes mellitus without complication ([place])    Hypertension    Renal disorder    CKD stage 3   Past Surgical History:  Procedure Laterality Date   AMPUTATION TOE     CHOLECYSTECTOMY     Social History   Socioeconomic History   Marital status: Married    Spouse name: Not on file   Number of children: Not on file   Years of education: Not on file   Highest education level: Not on file  Occupational History   Not on file  Tobacco Use   Smoking status: Never   Smokeless tobacco: Never  Vaping Use   Vaping Use: Never used  Substance and Sexual Activity   Alcohol use: No   Drug use: No   Sexual activity: Not on file  Other Topics Concern   Not on file  Social History Narrative   Not on file   Social Determinants of Health   Financial Resource Strain: Not on file  Food Insecurity: No Food Insecurity (06/18/2022)   Hunger Vital Sign    Worried About Running Out of Food in the Last Year: Never true    Ran Out of Food in the Last Year: Never true  Transportation Needs: No Transportation Needs (06/18/2022)   PRAPARE - Hydrologist (Medical): No    Lack of Transportation (Non-Medical): No  Physical Activity: Not on file  Stress: Not on file  Social Connections: Not on file   History reviewed. No pertinent family history. - negative except otherwise stated in the family history section No Known Allergies Prior to Admission medications   Medication Sig Start Date End Date Taking? Authorizing Provider  acetaminophen (TYLENOL) 500 MG tablet Take 1,000 mg  by mouth every 6 (six) hours as needed for moderate pain.   Yes [provider]  albuterol (VENTOLIN HFA) 108 (90 Base) MCG/ACT inhaler Inhale into the lungs. 11/10/21  Yes [provider]  amLODipine-valsartan (EXFORGE) 10-160 MG tablet Take 1 tablet by mouth daily. Resume on 5/31 12/27/17  Yes Barton Dubois, MD  aspirin EC 81 MG tablet Take 81 mg by mouth daily.   Yes [provider]  atorvastatin (LIPITOR) 10 MG tablet Take 10 mg by mouth daily.   Yes [provider]  cholecalciferol (VITAMIN D3) 25 MCG (1000 UNIT) tablet Take by mouth.   Yes [provider]  diphenhydramine-acetaminophen (TYLENOL PM) 25-500 MG TABS tablet Take 1 tablet by mouth at bedtime as needed (sleep).   Yes [provider]  furosemide (LASIX) 40 MG tablet Take 40 mg by mouth.   Yes [provider]  insulin glargine (LANTUS) 100 UNIT/ML injection Inject 30 Units into the skin at bedtime.   Yes [provider]  metoprolol (LOPRESSOR) 50 MG tablet Take 50 mg by mouth daily.   Yes [provider]  Multiple Vitamins-Minerals (PRESERVISION AREDS PO) Take 1 tablet by mouth daily.   Yes [provider]  sodium bicarbonate 650 MG tablet Take 1 tablet by mouth daily. 04/10/19  Yes [provider]   DG CHEST PORT 1 VIEW  Result Date: 06/17/2022 CLINICAL  DATA:  Acute respiratory failure.  Hypoxia. EXAM: PORTABLE CHEST 1 VIEW COMPARISON:  June 17, 2022 FINDINGS: Stable cardiomegaly. The hila and mediastinum are normal. Bilateral hazy opacities are identified, more focal in the bases, right greater than left. No pneumothorax. No nodule or mass. IMPRESSION: 1. Cardiomegaly. 2. Bilateral hazy opacities, more focal in the bases, right greater than left. Findings could represent pulmonary edema or multifocal infection. Recommend clinical correlation and follow-up to resolution. Electronically Signed   By: Dorise Bullion III M.D.   On:  06/17/2022 16:57   DG Foot 2 Views Right  Result Date: 06/17/2022 CLINICAL DATA:  Diabetic foot. EXAM: RIGHT FOOT - 2 VIEW COMPARISON:  None Available. FINDINGS: Post amputation midfoot level. Gas ulceration in the stomach. Osseous erosion. Potential skin ulceration posterior to the calcaneus. No subcutaneous gas identified. No osseous erosion. IMPRESSION: No osseous erosions to localize acute osteomyelitis. Electronically Signed   By: Suzy Bouchard M.D.   On: 06/17/2022 14:24   DG Chest 2 View  Result Date: 06/17/2022 CLINICAL DATA:  Suspected Sepsis EXAM: CHEST - 2 VIEW COMPARISON:  July 10, 2021 FINDINGS: The cardiomediastinal silhouette is unchanged in contour.Atherosclerotic calcifications. No pleural effusion. No pneumothorax. No acute pleuroparenchymal abnormality. Visualized abdomen is unremarkable. Multilevel degenerative changes of the thoracic spine. IMPRESSION: No acute cardiopulmonary abnormality. Electronically Signed   By: Valentino Saxon M.D.   On: 06/17/2022 12:51   - pertinent xrays, CT, MRI studies were reviewed and independently interpreted  Positive ROS: All other systems have been reviewed and were otherwise negative with the exception of those mentioned in the HPI and as above.  Physical Exam: General: Alert, no acute distress Psychiatric: Patient is competent for consent with normal mood and affect Lymphatic: No axillary or cervical lymphadenopathy Cardiovascular: No pedal edema Respiratory: No cyanosis, no use of accessory musculature GI: No organomegaly, abdomen is soft and non-tender    Images:  '@ENCIMAGES'$ @  Labs:  Lab Results  Component Value Date   HGBA1C 6.3 (H) 12/21/2017   HGBA1C (H) 04/28/2009    13.4 (NOTE) The ADA recommends the following therapeutic goal for glycemic control related to Hgb A1c measurement: Goal of therapy: <6.5 Hgb A1c  Reference: American Diabetes Association: Clinical Practice Recommendations 2010, Diabetes Care,  2010, 33: (Suppl  1).   ESRSEDRATE 110 (H) 06/17/2022   ESRSEDRATE 95 (H) 12/21/2017   CRP 9.0 (H) 06/17/2022   CRP 10.8 (H) 12/21/2017   REPTSTATUS PENDING 06/17/2022   REPTSTATUS PENDING 06/17/2022   GRAMSTAIN  12/22/2017    RARE WBC PRESENT, PREDOMINANTLY PMN ABUNDANT GRAM POSITIVE COCCI ABUNDANT GRAM NEGATIVE RODS FEW GRAM POSITIVE RODS Performed at [facility], [place] [address], [place], [place] [address]    CULT  06/17/2022    NO GROWTH < 12 HOURS Performed at [facility], [address], [place], [place] [address]    CULT  06/17/2022    NO GROWTH < 12 HOURS Performed at [facility], [address] [place], [place] [address]    LABORGA METHICILLIN RESISTANT STAPHYLOCOCCUS AUREUS 12/22/2017   LABORGA MORGANELLA MORGANII 12/22/2017    Lab Results  Component Value Date   ALBUMIN 2.3 (L) 06/18/2022   ALBUMIN 3.2 (L) 06/17/2022   ALBUMIN 2.5 (L) 12/25/2017   PREALBUMIN 13.2 (L) 12/21/2017        Latest Ref Rng & Units 06/18/2022    1:05 AM 06/17/2022    1:14 PM 07/10/2021   12:36 PM  CBC EXTENDED  WBC 4.0 - 10.5 K/uL  22.6  20.9  6.8   RBC 3.87 - 5.11 MIL/uL 2.98  3.25  3.85   Hemoglobin 12.0 - 15.0 g/dL 9.1  9.9  11.8   HCT 36.0 - 46.0 % 29.2  31.3  37.9   Platelets 150 - 400 K/uL 353  376  233   NEUT# 1.7 - 7.7 K/uL  18.0  4.8   Lymph# 0.7 - 4.0 K/uL  1.2  0.8     Neurologic: Patient does not have protective sensation bilateral lower extremities.   MUSCULOSKELETAL:   Skin: Examination patient has a necrotic wound over the plantar lateral aspect of the right transmetatarsal amputation.  There is necrotic tissue the ulcer extends down to bone.  Review of the MRI scan shows osteomyelitis of the lateral column.  White cell count 22.6 hemoglobin 9.1.  Albumin 2.3 with a prealbumin of 13.2.  Hemoglobin A1c 6.3.  I cannot palpate a palpable dorsalis pedis pulse however patient does not have foot salvage intervention options.  Assessment: Assessment:  Abscess osteomyelitis necrotic ulcer right transmetatarsal amputation.  Plan: Discussed with the patient with her sepsis and extensive infection in the right foot her recommended treatment would be a transtibial amputation.  Risk and benefits were discussed including risk of the wound not healing.  Patient states she understands she would like to think about this and I will read readdress these issues tomorrow morning.  Thank you for the consult and the opportunity to see Ms. Dawna Part, MD [facility] [phone] 7:47 AM

## 2022-06-18 NOTE — Plan of Care (Signed)
  Problem: Education: Goal: Ability to describe self-care measures that may prevent or decrease complications (Diabetes Survival Skills Education) will improve Outcome: Progressing Goal: Individualized Educational Video(s) Outcome: Progressing   Problem: Coping: Goal: Ability to adjust to condition or change in health will improve Outcome: Progressing   Problem: Fluid Volume: Goal: Ability to maintain a balanced intake and output will improve Outcome: Progressing   Problem: Health Behavior/Discharge Planning: Goal: Ability to identify and utilize available resources and services will improve Outcome: Progressing Goal: Ability to manage health-related needs will improve Outcome: Progressing   Problem: Metabolic: Goal: Ability to maintain appropriate glucose levels will improve Outcome: Progressing   Problem: Nutritional: Goal: Maintenance of adequate nutrition will improve Outcome: Progressing Goal: Progress toward achieving an optimal weight will improve Outcome: Progressing   Problem: Skin Integrity: Goal: Risk for impaired skin integrity will decrease Outcome: Progressing   Problem: Tissue Perfusion: Goal: Adequacy of tissue perfusion will improve Outcome: Progressing   Problem: Education: Goal: Knowledge of General Education information will improve Description: Including pain rating scale, medication(s)/side effects and non-pharmacologic comfort measures Outcome: Progressing   Problem: Health Behavior/Discharge Planning: Goal: Ability to manage health-related needs will improve Outcome: Progressing   Problem: Clinical Measurements: Goal: Ability to maintain clinical measurements within normal limits will improve Outcome: Progressing Goal: Will remain free from infection Outcome: Progressing Goal: Diagnostic test results will improve Outcome: Progressing Goal: Respiratory complications will improve Outcome: Progressing Goal: Cardiovascular complication will  be avoided Outcome: Progressing   Problem: Activity: Goal: Risk for activity intolerance will decrease Outcome: Progressing   Problem: Nutrition: Goal: Adequate nutrition will be maintained Outcome: Progressing   Problem: Coping: Goal: Level of anxiety will decrease Outcome: Progressing   Problem: Elimination: Goal: Will not experience complications related to bowel motility Outcome: Progressing Goal: Will not experience complications related to urinary retention Outcome: Progressing   Problem: Pain Managment: Goal: General experience of comfort will improve Outcome: Progressing   Problem: Safety: Goal: Ability to remain free from injury will improve Outcome: Progressing   Problem: Skin Integrity: Goal: Risk for impaired skin integrity will decrease Outcome: Progressing   Problem: Education: Goal: Knowledge of General Education information will improve Description: Including pain rating scale, medication(s)/side effects and non-pharmacologic comfort measures Outcome: Progressing   Problem: Health Behavior/Discharge Planning: Goal: Ability to manage health-related needs will improve Outcome: Progressing   Problem: Clinical Measurements: Goal: Ability to maintain clinical measurements within normal limits will improve Outcome: Progressing Goal: Will remain free from infection Outcome: Progressing Goal: Diagnostic test results will improve Outcome: Progressing Goal: Respiratory complications will improve Outcome: Progressing Goal: Cardiovascular complication will be avoided Outcome: Progressing   Problem: Activity: Goal: Risk for activity intolerance will decrease Outcome: Progressing   Problem: Nutrition: Goal: Adequate nutrition will be maintained Outcome: Progressing   Problem: Coping: Goal: Level of anxiety will decrease Outcome: Progressing   Problem: Elimination: Goal: Will not experience complications related to bowel motility Outcome:  Progressing Goal: Will not experience complications related to urinary retention Outcome: Progressing   Problem: Pain Managment: Goal: General experience of comfort will improve Outcome: Progressing   Problem: Safety: Goal: Ability to remain free from injury will improve Outcome: Progressing   Problem: Skin Integrity: Goal: Risk for impaired skin integrity will decrease Outcome: Progressing   Problem: Clinical Measurements: Goal: Ability to avoid or minimize complications of infection will improve Outcome: Progressing   Problem: Skin Integrity: Goal: Skin integrity will improve Outcome: Progressing

## 2022-06-18 NOTE — Progress Notes (Signed)
BLE venous duplex has been completed.  Results can be found under chart review under CV PROC. 06/18/2022 3:17 PM Josealberto Montalto RVT, RDMS

## 2022-06-19 ENCOUNTER — Encounter (HOSPITAL_COMMUNITY): Payer: Self-pay | Admitting: Family Medicine

## 2022-06-19 ENCOUNTER — Inpatient Hospital Stay (HOSPITAL_COMMUNITY): Payer: Medicare Other

## 2022-06-19 DIAGNOSIS — M86271 Subacute osteomyelitis, right ankle and foot: Secondary | ICD-10-CM | POA: Diagnosis not present

## 2022-06-19 DIAGNOSIS — A419 Sepsis, unspecified organism: Secondary | ICD-10-CM | POA: Diagnosis not present

## 2022-06-19 DIAGNOSIS — N179 Acute kidney failure, unspecified: Secondary | ICD-10-CM | POA: Diagnosis not present

## 2022-06-19 DIAGNOSIS — Z7189 Other specified counseling: Secondary | ICD-10-CM

## 2022-06-19 DIAGNOSIS — N184 Chronic kidney disease, stage 4 (severe): Secondary | ICD-10-CM

## 2022-06-19 DIAGNOSIS — Z515 Encounter for palliative care: Secondary | ICD-10-CM | POA: Diagnosis not present

## 2022-06-19 DIAGNOSIS — M869 Osteomyelitis, unspecified: Secondary | ICD-10-CM

## 2022-06-19 DIAGNOSIS — J9601 Acute respiratory failure with hypoxia: Secondary | ICD-10-CM | POA: Diagnosis not present

## 2022-06-19 DIAGNOSIS — R6520 Severe sepsis without septic shock: Secondary | ICD-10-CM | POA: Diagnosis not present

## 2022-06-19 LAB — BLOOD CULTURE ID PANEL (REFLEXED) - BCID2

## 2022-06-19 LAB — CBC
HCT: 27.3 % — ABNORMAL LOW (ref 36.0–46.0)
Hemoglobin: 8.8 g/dL — ABNORMAL LOW (ref 12.0–15.0)
MCH: 30.8 pg (ref 26.0–34.0)
MCHC: 32.2 g/dL (ref 30.0–36.0)
MCV: 95.5 fL (ref 80.0–100.0)
Platelets: 399 10*3/uL (ref 150–400)
RBC: 2.86 MIL/uL — ABNORMAL LOW (ref 3.87–5.11)
RDW: 12.7 % (ref 11.5–15.5)
WBC: 18.6 10*3/uL — ABNORMAL HIGH (ref 4.0–10.5)
nRBC: 0 % (ref 0.0–0.2)

## 2022-06-19 LAB — BASIC METABOLIC PANEL
Anion gap: 20 — ABNORMAL HIGH (ref 5–15)
BUN: 59 mg/dL — ABNORMAL HIGH (ref 8–23)
CO2: 14 mmol/L — ABNORMAL LOW (ref 22–32)
Calcium: 8.6 mg/dL — ABNORMAL LOW (ref 8.9–10.3)
Chloride: 103 mmol/L (ref 98–111)
Creatinine, Ser: 3.75 mg/dL — ABNORMAL HIGH (ref 0.44–1.00)
GFR, Estimated: 12 mL/min — ABNORMAL LOW (ref >60)
Glucose, Bld: 149 mg/dL — ABNORMAL HIGH (ref 70–99)
Potassium: 5 mmol/L (ref 3.5–5.1)
Sodium: 137 mmol/L (ref 135–145)

## 2022-06-19 LAB — GLUCOSE, CAPILLARY
Glucose-Capillary: 134 mg/dL — ABNORMAL HIGH (ref 70–99)
Glucose-Capillary: 133 mg/dL — ABNORMAL HIGH (ref 70–99)
Glucose-Capillary: 117 mg/dL — ABNORMAL HIGH (ref 70–99)
Glucose-Capillary: 145 mg/dL — ABNORMAL HIGH (ref 70–99)
Glucose-Capillary: 121 mg/dL — ABNORMAL HIGH (ref 70–99)

## 2022-06-19 LAB — MRSA NEXT GEN BY PCR, NASAL: MRSA by PCR Next Gen: DETECTED — AB

## 2022-06-19 MED ORDER — MUPIROCIN 2 % EX OINT
1.0000 | TOPICAL_OINTMENT | Freq: Two times a day (BID) | CUTANEOUS | Status: DC
  Administered 2022-06-19 – 2022-06-20 (×3): 1 via NASAL
  Filled 2022-06-19: qty 22

## 2022-06-19 MED ORDER — HYDRALAZINE HCL 10 MG PO TABS
10.0000 mg | ORAL_TABLET | Freq: Three times a day (TID) | ORAL | Status: DC
  Administered 2022-06-19 – 2022-06-22 (×9): 10 mg via ORAL
  Filled 2022-06-19 (×10): qty 1

## 2022-06-19 MED ORDER — FUROSEMIDE 10 MG/ML IJ SOLN
120.0000 mg | Freq: Once | INTRAVENOUS | Status: AC
  Administered 2022-06-19: 120 mg via INTRAVENOUS
  Filled 2022-06-19: qty 10

## 2022-06-19 MED ORDER — ISOSORBIDE MONONITRATE ER 30 MG PO TB24
15.0000 mg | ORAL_TABLET | Freq: Every day | ORAL | Status: DC
  Administered 2022-06-19 – 2022-06-22 (×4): 15 mg via ORAL
  Filled 2022-06-19 (×4): qty 1

## 2022-06-19 MED ORDER — FUROSEMIDE 10 MG/ML IJ SOLN
60.0000 mg | Freq: Two times a day (BID) | INTRAMUSCULAR | Status: DC
  Administered 2022-06-19: 60 mg via INTRAVENOUS
  Filled 2022-06-19: qty 6

## 2022-06-19 MED ORDER — METOPROLOL SUCCINATE ER 50 MG PO TB24
50.0000 mg | ORAL_TABLET | Freq: Every day | ORAL | Status: DC
  Administered 2022-06-20 – 2022-06-22 (×3): 50 mg via ORAL
  Filled 2022-06-19 (×3): qty 1

## 2022-06-19 MED ORDER — SODIUM BICARBONATE 650 MG PO TABS
1300.0000 mg | ORAL_TABLET | Freq: Four times a day (QID) | ORAL | Status: DC
  Administered 2022-06-19 – 2022-06-21 (×8): 1300 mg via ORAL
  Filled 2022-06-19 (×8): qty 2

## 2022-06-19 MED ORDER — ATORVASTATIN CALCIUM 40 MG PO TABS
40.0000 mg | ORAL_TABLET | Freq: Every day | ORAL | Status: DC
  Administered 2022-06-20: 40 mg via ORAL
  Filled 2022-06-19: qty 1

## 2022-06-19 MED ORDER — CHLORHEXIDINE GLUCONATE CLOTH 2 % EX PADS
6.0000 | MEDICATED_PAD | Freq: Every day | CUTANEOUS | Status: DC
  Administered 2022-06-20 – 2022-06-22 (×3): 6 via TOPICAL

## 2022-06-19 MED ORDER — HEPARIN (PORCINE) 25000 UT/250ML-% IV SOLN
1500.0000 [IU]/h | INTRAVENOUS | Status: DC
  Administered 2022-06-19 – 2022-06-20 (×3): 1500 [IU]/h via INTRAVENOUS
  Filled 2022-06-19 (×3): qty 250

## 2022-06-19 NOTE — H&P (Signed)
Plan right heart cath to determine volume status in a patient with heart failure/respiratory failure/requiring tune up for surgery.  Has CKD 4 and refuses coronary angiography.

## 2022-06-19 NOTE — Progress Notes (Signed)
ANTICOAGULATION CONSULT NOTE - Follow Up  Pharmacy Consult for Heparin Indication: afib  No Known Allergies  Patient Measurements: Height: '5\' 4"'$  (162.6 cm) Weight: 113.4 kg (250 lb) IBW/kg (Calculated) : 54.7   Vital Signs: Temp: 99.5 F (37.5 C) (11/21 1200) Temp Source: Oral (11/21 1200) BP: 150/87 (11/21 1200) Pulse Rate: 72 (11/21 1200)  Labs: Recent Labs    06/17/22 1314 06/18/22 0105 06/18/22 1056 06/19/22 0314 06/19/22 0350  HGB 9.9* 9.1*  --   --  8.8*  HCT 31.3* 29.2*  --   --  27.3*  PLT 376 353  --   --  399  APTT 34  --   --   --   --   LABPROT 14.9  --   --   --   --   INR 1.2  --   --   --   --   HEPARINUNFRC  --  0.26* 0.57  --   --   CREATININE 3.81* 3.62*  --  3.75*  --      Estimated Creatinine Clearance: 16.2 mL/min (A) (by C-G formula based on SCr of 3.75 mg/dL (H)).   Medical History: Past Medical History:  Diagnosis Date   Diabetes mellitus without complication ([place])    Hypertension    Renal disorder    CKD stage 3     Assessment: 76 y/o F initially on heparin for rule out PE, with negative VQ scan.  Heparin was changed to subq for VTE prophylaxis.  Patient then evaluated by cards for peri-operative risk assessment.  Cards recommending treatment dose heparin continue for afib with plans for cath and possible BKA.  Ultimate plans for oral anticoagulation when procedures completed.  Patient previously therapeutic on heparin at 1500 units/hr. Will restart at this rate.    Goal of Therapy:  Heparin level 0.3-0.7 units/ml Monitor platelets by anticoagulation protocol: Yes   Plan:  Restart heparin infusion at 1500 units/hr Check heparin level and CBC daily while on heparin.  Follow up venous duplex study results.  Follow up plans for cardiac cath 11/22.  [employer], Pharm.D., BCPS Clinical Pharmacist  **Pharmacist phone directory can be found on [email or web address] listed under [place].  06/19/2022 4:17 PM

## 2022-06-19 NOTE — Consult Note (Signed)
Cardiology Consultation   Patient ID: Courtney Schneider MRN: [id number]; DOB: May 02, 1946  Admit date: 06/17/2022 Date of Consult: 06/19/2022  PCP:  Pablo Lawrence, NP   [facility] Prov[facility] Cardiologist:  [facility] (Dr. Martinique)  Patient Profile:   Courtney Schneider is a 76 y.o. female with a history of hypertension, hyperlipidemia, type 2 diabetes mellitus, CKD stage IV, and s/p right transmetatarsal amputation secondary to osteomyelitis in 2010  who is being seen 06/19/2022 for pre-op evaluation for right below knee amputation at the request of Dr. Waldron Labs.   History of Present Illness:   Courtney Schneider is a 76 year old female with the above history.  No known cardiac history prior to admission.  Patient ED on 06/17/2022 for evaluation of weakness as well as increasing pain and swelling of her right foot was found to be septic from a right foot ulcer.  Foot x-ray showed no osseous erosions to localize acute osteomyelitis.  MRI foot showed lateral plantar midfoot soft tissue ulceration adjacent to the residual fifth metatarsal base with underlying osteomyelitis of the residual fourth and fifth metatarsal bases.  She was started on IV fluids and antibiotics while in the ED, she became acutely hypoxic with O2 sats in the 80s and tachypneic but denied feeling short of breath.  Chest x-ray showed no acute findings.  BNP elevated at 198.  She started on IV heparin due to concern for possible PE.  D-dimer was elevated at 3.31.  VQ scan was ordered and showed normal perfusion with no evidence of PE.  Lower extremity Dopplers were negative for DVT.  Echo showed LVEF of 35-40% with hypokinesis of the mid and distal anterior wall, mid and distal anterior septum, and entire apex as well as grade 1 diastolic dysfunction and mild aortic stenosis.  She is currently being treated for acute CHF as well as pneumonia. She actually ended up requiring BiPAP last night due to decline in respiratory  status.   Ortho ultimately saw the patient and has recommended a transtibial below-knee amputation tomorrow.  Therefore, Cardiology has been consulted for preop evaluation in setting of acute CHF.  At the time of this evaluation, patient O2 sats are in the high 80s on 15 L of HFNC. She also appears to be in new onset atrial fibrillation. She states she that over the last month she has just not been feeling well at all. She described decreased appetite and weight loss. However, she reported worsening lower extremity edema over the last 3 months as well as worsening shortness of breath over the last month. She has some chronic dyspnea on exertion but states it has worsened over the last month and she gets short of breath after walking short distances such as from one room to the other. She denies any real shortness of breath at rest. She has been sleeping a recliner lately but she states this is because she likes to "stretch out." She does not describe orthopnea or any recent PND. She denies any chest pain, palpitations, lightheadedness, dizziness, or syncope. It sounds like she is pretty sedentary. She states she often weights in the car when friends or family take her to the grocery store because it is difficult for her to get around. This is largely due to her prior transmetatarsal amputation. She has multiple falls due to balance issues from this. She reports some body chills for about 1 week prior to presentation and had a low grade fever on admission. She also reports a recent productive  cough. She has intermittent diarrhea but no other GI symptoms. No abnormal bleeding in urine or stools.  She denies any smoking history. She thinks her maternal grandmother had some type of heart disease but unclear what. No other known family history of heart disease.  Past Medical History:  Diagnosis Date   Diabetes mellitus without complication ([place])    Hypertension    Renal disorder    CKD stage 3    Past  Surgical History:  Procedure Laterality Date   AMPUTATION TOE     CHOLECYSTECTOMY       Home Medications:  Prior to Admission medications   Medication Sig Start Date End Date Taking? Authorizing Provider  acetaminophen (TYLENOL) 500 MG tablet Take 1,000 mg by mouth every 6 (six) hours as needed for moderate pain.   Yes [provider]  albuterol (VENTOLIN HFA) 108 (90 Base) MCG/ACT inhaler Inhale into the lungs. 11/10/21  Yes [provider]  amLODipine-valsartan (EXFORGE) 10-160 MG tablet Take 1 tablet by mouth daily. Resume on 5/31 12/27/17  Yes Barton Dubois, MD  aspirin EC 81 MG tablet Take 81 mg by mouth daily.   Yes [provider]  atorvastatin (LIPITOR) 10 MG tablet Take 10 mg by mouth daily.   Yes [provider]  cholecalciferol (VITAMIN D3) 25 MCG (1000 UNIT) tablet Take by mouth.   Yes [provider]  diphenhydramine-acetaminophen (TYLENOL PM) 25-500 MG TABS tablet Take 1 tablet by mouth at bedtime as needed (sleep).   Yes [provider]  furosemide (LASIX) 40 MG tablet Take 40 mg by mouth.   Yes [provider]  insulin glargine (LANTUS) 100 UNIT/ML injection Inject 30 Units into the skin at bedtime.   Yes [provider]  metoprolol (LOPRESSOR) 50 MG tablet Take 50 mg by mouth daily.   Yes [provider]  Multiple Vitamins-Minerals (PRESERVISION AREDS PO) Take 1 tablet by mouth daily.   Yes [provider]  sodium bicarbonate 650 MG tablet Take 1 tablet by mouth daily. 04/10/19  Yes [provider]    Inpatient Medications: Scheduled Meds:  atorvastatin  10 mg Oral Daily   chlorhexidine  60 mL Topical Once   furosemide  60 mg Intravenous BID   heparin injection (subcutaneous)  5,000 Units Subcutaneous Q8H   insulin aspart  0-9 Units Subcutaneous TID WC   insulin glargine-yfgn  10 Units Subcutaneous QHS   melatonin  5 mg Oral QHS   metoprolol tartrate  50 mg Oral Daily    povidone-iodine  2 Application Topical Once   sodium bicarbonate  1,300 mg Oral QID   traZODone  50 mg Oral QHS   Continuous Infusions:  ceFEPime (MAXIPIME) IV 2 g (06/19/22 0933)   metronidazole 100 mL/hr at 06/19/22 0400   vancomycin 1,000 mg (06/19/22 1443)   PRN Meds: acetaminophen **OR** acetaminophen, HYDROcodone-acetaminophen, ondansetron **OR** ondansetron (ZOFRAN) IV, mouth rinse  Allergies:   No Known Allergies  Social History:   Social History   Socioeconomic History   Marital status: Married    Spouse name: Not on file   Number of children: Not on file   Years of education: Not on file   Highest education level: Not on file  Occupational History   Not on file  Tobacco Use   Smoking status: Never   Smokeless tobacco: Never  Vaping Use   Vaping Use: Never used  Substance and Sexual Activity   Alcohol use: No   Drug use: No  Sexual activity: Not on file  Other Topics Concern   Not on file  Social History Narrative   Not on file   Social Determinants of Health   Financial Resource Strain: Not on file  Food Insecurity: No Food Insecurity (06/18/2022)   Hunger Vital Sign    Worried About Running Out of Food in the Last Year: Never true    Ran Out of Food in the Last Year: Never true  Transportation Needs: No Transportation Needs (06/18/2022)   PRAPARE - Hydrologist (Medical): No    Lack of Transportation (Non-Medical): No  Physical Activity: Not on file  Stress: Not on file  Social Connections: Not on file  Intimate Partner Violence: Not At Risk (06/18/2022)   Humiliation, Afraid, Rape, and Kick questionnaire    Fear of Current or Ex-Partner: No    Emotionally Abused: No    Physically Abused: No    Sexually Abused: No    Family History:   Family History  Problem Relation Age of Onset   Diabetes Father    COPD Father    Heart disease Maternal Grandmother      ROS:  Please see the history of present illness.   Review of Systems  Constitutional:  Positive for chills. Negative for fever.  HENT:  Negative for congestion.   Respiratory:  Positive for cough, sputum production and shortness of breath.   Cardiovascular:  Positive for leg swelling. Negative for chest pain, palpitations, orthopnea and PND.  Gastrointestinal:  Positive for diarrhea (intermittent). Negative for blood in stool, melena, nausea and vomiting.  Genitourinary:  Negative for hematuria.  Musculoskeletal:  Positive for falls.  Neurological:  Negative for dizziness and loss of consciousness.  Endo/Heme/Allergies:  Does not bruise/bleed easily.  Psychiatric/Behavioral:  Negative for substance abuse.     All other ROS reviewed and negative.     Physical Exam/Data:   Vitals:   06/19/22 0842 06/19/22 0930 06/19/22 1000 06/19/22 1200  BP:   (!) 158/129 (!) 150/87  Pulse: 63  73 72  Resp: (!) '22  20 15  '$ Temp:    99.5 F (37.5 C)  TempSrc:    Oral  SpO2: 98% 91% (!) 88% (!) 87%  Weight:      Height:        Intake/Output Summary (Last 24 hours) at 06/19/2022 1534 Last data filed at 06/19/2022 0400 Gross per 24 hour  Intake 505.3 ml  Output 600 ml  Net -94.7 ml      06/17/2022   12:25 PM 01/21/2018   10:01 AM 12/21/2017    4:26 PM  Last 3 Weights  Weight (lbs) 250 lb 253 lb 254 lb 3.1 oz  Weight (kg) 113.399 kg 114.76 kg 115.3 kg     Body mass index is 42.91 kg/m.  General: 76 y.o. morbidly obese Caucasian female resting comfortably in no acute distress. On 15L of O2 via HFNC. HEENT: Normocephalic and atraumatic. Sclera clear.  Neck: Supple. JVD difficult to assess due to body habitus. Heart: Irregular rhythm with normal rate. Distinct S1 and S2. No murmurs, gallops, or rubs. Radial and distal pedal pulses 2+ and equal bilaterally. Lungs: No increased work of breathing. Bibasilar crackles and scattered expiratory wheezing. Abdomen: Soft, non-distended, and non-tender to palpation. Bowel sounds present. Extremities:  1+ pitting edema of bilateral lower extremities. S/p transmetatarsal amputation of the right foot. Ulcer on plantar surface of right foot. Skin: Warm and dry. Neuro: Alert and oriented x3. No  focal deficits. Psych: Normal affect. Responds appropriately.   EKG:  The EKG was personally reviewed and demonstrates:  Normal sinus rhythm, rate 89 bpm, with no acute ST/T changes.  Normal axis.  Normal PR and QRS intervals.  QTc 431 ms.  Telemetry:  Telemetry was personally reviewed and demonstrates:  Atrial fibrillation with rates in the 70s.  Relevant CV Studies:  Echocardiogram 06/18/2022: Impressions: 1. Left ventricular ejection fraction, by estimation, is 35 to 40%. The  left ventricle has moderately decreased function. The left ventricle  demonstrates regional wall motion abnormalities (see scoring  diagram/findings for description). There is mild  concentric left ventricular hypertrophy. Left ventricular diastolic  parameters are consistent with Grade I diastolic dysfunction (impaired  relaxation). Elevated left ventricular end-diastolic pressure.   2. Right ventricular systolic function is normal. The right ventricular  size is normal. There is mildly elevated pulmonary artery systolic  pressure.   3. Left atrial size was moderately dilated.   4. The mitral valve is normal in structure. Trivial mitral valve  regurgitation. No evidence of mitral stenosis.   5. The aortic valve is tricuspid. There is mild calcification of the  aortic valve. There is mild thickening of the aortic valve. Aortic valve  regurgitation is not visualized. Mild aortic valve stenosis. Aortic valve  area, by VTI measures 1.55 cm.  Aortic valve mean gradient measures 12.0 mmHg. Aortic valve Vmax measures  2.20 m/s.   6. The inferior vena cava is dilated in size with >50% respiratory  variability, suggesting right atrial pressure of 8 mmHg.    Laboratory Data:  High Sensitivity Troponin:  No results for  input(s): "TROPONINIHS" in the last 720 hours.   Chemistry Recent Labs  Lab 06/17/22 1314 06/18/22 0105 06/19/22 0314  NA 134* 134* 137  K 4.7 4.3 5.0  CL 105 104 103  CO2 16* 17* 14*  GLUCOSE 168* 134* 149*  BUN 54* 51* 59*  CREATININE 3.81* 3.62* 3.75*  CALCIUM 8.8* 8.6* 8.6*  GFRNONAA 12* 13* 12*  ANIONGAP 13 13 20*    Recent Labs  Lab 06/17/22 1314 06/18/22 0105  PROT 7.3 5.9*  ALBUMIN 3.2* 2.3*  AST 16 16  ALT 8 7  ALKPHOS 87 71  BILITOT 0.6 0.4   Lipids No results for input(s): "CHOL", "TRIG", "HDL", "LABVLDL", "LDLCALC", "CHOLHDL" in the last 168 hours.  Hematology Recent Labs  Lab 06/17/22 1314 06/18/22 0105 06/19/22 0350  WBC 20.9* 22.6* 18.6*  RBC 3.25* 2.98* 2.86*  HGB 9.9* 9.1* 8.8*  HCT 31.3* 29.2* 27.3*  MCV 96.3 98.0 95.5  MCH 30.5 30.5 30.8  MCHC 31.6 31.2 32.2  RDW 12.7 12.5 12.7  PLT 376 353 399   Thyroid No results for input(s): "TSH", "FREET4" in the last 168 hours.  BNP Recent Labs  Lab 06/17/22 1711  BNP 198.0*    DDimer  Recent Labs  Lab 06/17/22 1711  DDIMER 3.31*     Radiology/Studies:  DG Chest Port 1 View  Result Date: 06/19/2022 CLINICAL DATA:  Dyspnea EXAM: PORTABLE CHEST 1 VIEW COMPARISON:  06/17/2022 chest radiograph. FINDINGS: Stable cardiomediastinal silhouette with mild cardiomegaly. No pneumothorax. No pleural effusion. Diffuse hazy patchy parahilar lung opacities, most prominent in the upper lungs, worsened. IMPRESSION: Stable mild cardiomegaly. Worsened diffuse hazy patchy parahilar lung opacities, most prominent in the upper lungs, favor pulmonary edema. Electronically Signed   By: Ilona Sorrel M.D.   On: 06/19/2022 08:31   ECHOCARDIOGRAM COMPLETE  Result Date: 06/18/2022    ECHOCARDIOGRAM  REPORT   Patient Name:   Courtney Schneider Date of Exam: 06/18/2022 Medical Rec #:  [id number]          Height:       64.0 in Accession #:    [id number]         Weight:       250.0 lb Date of Birth:  02/13/1946         BSA:           2.151 m Patient Age:    50 years           BP:           139/62 mmHg Patient Gender: F                  HR:           75 bpm. Exam Location:  Inpatient Procedure: 2D Echo, Cardiac Doppler, Color Doppler and Intracardiac            Opacification Agent Indications:    CHF-Acute Diastolic Y85.02  History:        Patient has no prior history of Echocardiogram examinations.                 CHF, Sepsis; Risk Factors:Hypertension, Dyslipidemia, Diabetes                 and Non-Smoker.  Sonographer:    Greer Pickerel Referring Phys: [id number] Orma Flaming  Sonographer Comments: Technically difficult study due to poor echo windows. Image acquisition challenging due to COPD and Image acquisition challenging due to patient body habitus. IMPRESSIONS  1. Left ventricular ejection fraction, by estimation, is 35 to 40%. The left ventricle has moderately decreased function. The left ventricle demonstrates regional wall motion abnormalities (see scoring diagram/findings for description). There is mild concentric left ventricular hypertrophy. Left ventricular diastolic parameters are consistent with Grade I diastolic dysfunction (impaired relaxation). Elevated left ventricular end-diastolic pressure.  2. Right ventricular systolic function is normal. The right ventricular size is normal. There is mildly elevated pulmonary artery systolic pressure.  3. Left atrial size was moderately dilated.  4. The mitral valve is normal in structure. Trivial mitral valve regurgitation. No evidence of mitral stenosis.  5. The aortic valve is tricuspid. There is mild calcification of the aortic valve. There is mild thickening of the aortic valve. Aortic valve regurgitation is not visualized. Mild aortic valve stenosis. Aortic valve area, by VTI measures 1.55 cm. Aortic valve mean gradient measures 12.0 mmHg. Aortic valve Vmax measures 2.20 m/s.  6. The inferior vena cava is dilated in size with >50% respiratory variability, suggesting right  atrial pressure of 8 mmHg. FINDINGS  Left Ventricle: Left ventricular ejection fraction, by estimation, is 35 to 40%. The left ventricle has moderately decreased function. The left ventricle demonstrates regional wall motion abnormalities. Definity contrast agent was given IV to delineate the left ventricular endocardial borders. The left ventricular internal cavity size was normal in size. There is mild concentric left ventricular hypertrophy. Left ventricular diastolic parameters are consistent with Grade I diastolic dysfunction (impaired relaxation). Elevated left ventricular end-diastolic pressure.  LV Wall Scoring: The mid and distal anterior wall, mid and distal anterior septum, and entire apex are hypokinetic. The antero-lateral wall, inferior wall, posterior wall, basal anteroseptal segment, mid inferoseptal segment, basal anterior segment, and basal inferoseptal segment are normal. Right Ventricle: The right ventricular size is normal. No increase in right ventricular wall thickness. Right ventricular systolic function is normal. There  is mildly elevated pulmonary artery systolic pressure. The tricuspid regurgitant velocity is 2.65  m/s, and with an assumed right atrial pressure of 8 mmHg, the estimated right ventricular systolic pressure is 85.8 mmHg. Left Atrium: Left atrial size was moderately dilated. Right Atrium: Right atrial size was normal in size. Pericardium: There is no evidence of pericardial effusion. Mitral Valve: The mitral valve is normal in structure. Trivial mitral valve regurgitation. No evidence of mitral valve stenosis. Tricuspid Valve: The tricuspid valve is normal in structure. Tricuspid valve regurgitation is trivial. No evidence of tricuspid stenosis. Aortic Valve: The aortic valve is tricuspid. There is mild calcification of the aortic valve. There is mild thickening of the aortic valve. Aortic valve regurgitation is not visualized. Mild aortic stenosis is present. Aortic valve  mean gradient measures  12.0 mmHg. Aortic valve peak gradient measures 19.4 mmHg. Aortic valve area, by VTI measures 1.55 cm. Pulmonic Valve: The pulmonic valve was normal in structure. Pulmonic valve regurgitation is not visualized. No evidence of pulmonic stenosis. Aorta: The aortic root is normal in size and structure. Venous: The inferior vena cava is dilated in size with greater than 50% respiratory variability, suggesting right atrial pressure of 8 mmHg. IAS/Shunts: No atrial level shunt detected by color flow Doppler.  LEFT VENTRICLE PLAX 2D LVIDd:         4.00 cm      Diastology LVIDs:         3.30 cm      LV e' medial:    6.09 cm/s LV PW:         1.20 cm      LV E/e' medial:  17.6 LV IVS:        1.10 cm      LV e' lateral:   3.92 cm/s LVOT diam:     1.90 cm      LV E/e' lateral: 27.3 LV SV:         76 LV SV Index:   35 LVOT Area:     2.84 cm  LV Volumes (MOD) LV vol d, MOD A2C: 146.0 ml LV vol d, MOD A4C: 162.0 ml LV vol s, MOD A2C: 41.4 ml LV vol s, MOD A4C: 110.0 ml LV SV MOD A2C:     104.6 ml LV SV MOD A4C:     162.0 ml LV SV MOD BP:      85.0 ml RIGHT VENTRICLE RV S prime:     9.79 cm/s TAPSE (M-mode): 2.0 cm LEFT ATRIUM           Index        RIGHT ATRIUM           Index LA diam:      3.90 cm 1.81 cm/m   RA Area:     19.90 cm LA Vol (A2C): 82.3 ml 38.26 ml/m  RA Volume:   61.10 ml  28.41 ml/m LA Vol (A4C): 42.6 ml 19.80 ml/m  AORTIC VALVE AV Area (Vmax):    1.53 cm AV Area (Vmean):   1.49 cm AV Area (VTI):     1.55 cm AV Vmax:           220.50 cm/s AV Vmean:          159.000 cm/s AV VTI:            0.489 m AV Peak Grad:      19.4 mmHg AV Mean Grad:      12.0 mmHg LVOT Vmax:  119.00 cm/s LVOT Vmean:        83.500 cm/s LVOT VTI:          0.267 m LVOT/AV VTI ratio: 0.55  AORTA Ao Root diam: 3.30 cm Ao Asc diam:  3.10 cm MITRAL VALVE                TRICUSPID VALVE MV Area (PHT): 3.21 cm     TR Peak grad:   28.1 mmHg MV Decel Time: 236 msec     TR Vmax:        265.00 cm/s MV E velocity:  107.00 cm/s                             SHUNTS                             Systemic VTI:  0.27 m                             Systemic Diam: 1.90 cm Skeet Latch MD Electronically signed by Skeet Latch MD Signature Date/Time: 06/18/2022/6:03:27 PM    Final    VAS [place] LOWER EXTREMITY VENOUS (DVT)  Result Date: 06/18/2022  Lower Venous DVT Study Patient Name:  Courtney Schneider  Date of Exam:   06/18/2022 Medical Rec #: [id number]           Accession #:    [id number] Date of Birth: May 14, 1946          Patient Gender: F Patient Age:   76 years Exam Location:  [facility] Procedure:      VAS [place] LOWER EXTREMITY VENOUS (DVT) Referring Phys: DAWOOD ELGERGAWY --------------------------------------------------------------------------------  Indications: Pain, and Edema.  Limitations: Body habitus, poor ultrasound/tissue interface and pain intolerance. Comparison Study: No previous exams Performing Technologist: Jody Hill RVT, RDMS  Examination Guidelines: A complete evaluation includes B-mode imaging, spectral Doppler, color Doppler, and power Doppler as needed of all accessible portions of each vessel. Bilateral testing is considered an integral part of a complete examination. Limited examinations for reoccurring indications may be performed as noted. The reflux portion of the exam is performed with the patient in reverse Trendelenburg.  +--------+---------------+---------+-----------+----------+--------------------+ RIGHT   CompressibilityPhasicitySpontaneityPropertiesThrombus Aging       +--------+---------------+---------+-----------+----------+--------------------+ CFV     Full           Yes      Yes                                       +--------+---------------+---------+-----------+----------+--------------------+ SFJ     Full                                                              +--------+---------------+---------+-----------+----------+--------------------+ FV Prox  Full           Yes      Yes                                       +--------+---------------+---------+-----------+----------+--------------------+  FV Mid  Full           Yes      Yes                                       +--------+---------------+---------+-----------+----------+--------------------+ FV                     Yes      Yes                  patent by            Distal                                               color/doppler        +--------+---------------+---------+-----------+----------+--------------------+ PFV     Full                                                              +--------+---------------+---------+-----------+----------+--------------------+ POP     Full                                                              +--------+---------------+---------+-----------+----------+--------------------+ PTV     Full                                         Not well visualized  +--------+---------------+---------+-----------+----------+--------------------+ PERO    Full                                         Not well visualized  +--------+---------------+---------+-----------+----------+--------------------+   +---------+---------------+---------+-----------+----------+-------------------+ LEFT     CompressibilityPhasicitySpontaneityPropertiesThrombus Aging      +---------+---------------+---------+-----------+----------+-------------------+ CFV      Full           Yes      Yes                                      +---------+---------------+---------+-----------+----------+-------------------+ SFJ      Full                                                             +---------+---------------+---------+-----------+----------+-------------------+ FV Prox  Full           Yes      Yes                                      +---------+---------------+---------+-----------+----------+-------------------+  FV Mid    Full           Yes      Yes                                      +---------+---------------+---------+-----------+----------+-------------------+ FV DistalFull           Yes      Yes                                      +---------+---------------+---------+-----------+----------+-------------------+ PFV      Full                                                             +---------+---------------+---------+-----------+----------+-------------------+ POP      Full           Yes      Yes                                      +---------+---------------+---------+-----------+----------+-------------------+ PTV      Full                                         Not well visualized +---------+---------------+---------+-----------+----------+-------------------+ PERO     Full                                         Not well visualized +---------+---------------+---------+-----------+----------+-------------------+     Summary: BILATERAL: - No evidence of deep vein thrombosis seen in the lower extremities in areas visualized, bilaterally. -No evidence of popliteal cyst, bilaterally.   *See table(s) above for measurements and observations. Electronically signed by Servando Snare MD on 06/18/2022 at 5:31:54 PM.    Final    NM Pulmonary Perfusion  Result Date: 06/18/2022 CLINICAL DATA:  76 year old female with shortness of breath. EXAM: NUCLEAR MEDICINE PERFUSION LUNG SCAN TECHNIQUE: Perfusion images were obtained in multiple projections after intravenous injection of radiopharmaceutical. Ventilation scans intentionally deferred if perfusion scan and chest x-ray adequate for interpretation during COVID 19 epidemic. RADIOPHARMACEUTICALS:  4.1 mCi Tc-44mMAA IV COMPARISON:  Portable chest x-ray yesterday 1647 hours. FINDINGS: Homogeneous perfusion radiotracer activity in both lungs when accounting for mediastinal - and on the lateral views upper extremity related photopenia. No  convincing perfusion defect. IMPRESSION: Normal perfusion, no evidence of pulmonary embolus. Electronically Signed   By: HGenevie AnnM.D.   On: 06/18/2022 10:16   MR FOOT RIGHT WO CONTRAST  Result Date: 06/18/2022 CLINICAL DATA:  Diabetic right foot ulcer. History of prior amputation. EXAM: MRI OF THE RIGHT FOREFOOT WITHOUT CONTRAST TECHNIQUE: Multiplanar, multisequence MR imaging of the right foot was performed. No intravenous contrast was administered. COMPARISON:  Right foot x-rays from same day. MRI right foot dated Dec 24, 2017. FINDINGS: Bones/Joint/Cartilage Prior transmetatarsal amputation. Abnormal marrow edema with early decreased T1 marrow signal involving the residual fourth and  fifth metatarsal bases. No fracture or dislocation. Joint spaces are preserved. No joint effusion. Ligaments Medial and lateral ankle ligaments are grossly intact. Muscles and Tendons No tenosynovitis. Soft tissue Lateral plantar midfoot soft tissue ulceration adjacent to the residual fifth metatarsal base with subcutaneous emphysema but no fluid collection. Diffuse soft tissue swelling. No soft tissue mass. IMPRESSION: 1. Lateral plantar midfoot soft tissue ulceration adjacent to the residual fifth metatarsal base with underlying osteomyelitis of the residual fourth and fifth metatarsal bases. No abscess. Electronically Signed   By: Titus Dubin M.D.   On: 06/18/2022 07:53   DG CHEST PORT 1 VIEW  Result Date: 06/17/2022 CLINICAL DATA:  Acute respiratory failure.  Hypoxia. EXAM: PORTABLE CHEST 1 VIEW COMPARISON:  June 17, 2022 FINDINGS: Stable cardiomegaly. The hila and mediastinum are normal. Bilateral hazy opacities are identified, more focal in the bases, right greater than left. No pneumothorax. No nodule or mass. IMPRESSION: 1. Cardiomegaly. 2. Bilateral hazy opacities, more focal in the bases, right greater than left. Findings could represent pulmonary edema or multifocal infection. Recommend clinical  correlation and follow-up to resolution. Electronically Signed   By: Dorise Bullion III M.D.   On: 06/17/2022 16:57   DG Foot 2 Views Right  Result Date: 06/17/2022 CLINICAL DATA:  Diabetic foot. EXAM: RIGHT FOOT - 2 VIEW COMPARISON:  None Available. FINDINGS: Post amputation midfoot level. Gas ulceration in the stomach. Osseous erosion. Potential skin ulceration posterior to the calcaneus. No subcutaneous gas identified. No osseous erosion. IMPRESSION: No osseous erosions to localize acute osteomyelitis. Electronically Signed   By: Suzy Bouchard M.D.   On: 06/17/2022 14:24   DG Chest 2 View  Result Date: 06/17/2022 CLINICAL DATA:  Suspected Sepsis EXAM: CHEST - 2 VIEW COMPARISON:  July 10, 2021 FINDINGS: The cardiomediastinal silhouette is unchanged in contour.Atherosclerotic calcifications. No pleural effusion. No pneumothorax. No acute pleuroparenchymal abnormality. Visualized abdomen is unremarkable. Multilevel degenerative changes of the thoracic spine. IMPRESSION: No acute cardiopulmonary abnormality. Electronically Signed   By: Valentino Saxon M.D.   On: 06/17/2022 12:51     Assessment and Plan:   Pre-Op Evaluation Patient presented with sepsis secondary to right foot ulcer and acute osteomyelitis.  Ortho has recommended right tibial below-knee amputation.  Cardiology has been consulted for preop evaluation. This is complicated by new onset acute CHF with development of pulmonary edema after receiving IV fluids in the ED. She also has new onset atrial fibrillation. Echo this admission showed LVEF of 35-40% with hypokinesis of the mid and distal anterior wall, mid and distal anterior septum, and entire apex (in distribution of LAD) as well as grade 1 diastolic dysfunction and mild aortic stenosis. She required BiPAP last night due to her respiratory status and is currently requiring 15 L of HFNC with O2 sat in the high 80s. She is high risk for any surgeries. Per Revised Cardiac Risk  Index, considered class IV risk with 15% 30 day risk for death, MI, or cardiac arrest given history of CHF, diabetes on insulin, and CKD. She is going to be high risk regardless but would recommend delaying surgery to better optimize her from a cardiac standpoint.  Given newly reduced EF with wall motion abnormalities, there is also concern that she has CAD. Would ideally perform cardiac catheterization but she has CKD stage IV with creatinine of 3.7 and is adamant that she does not want surgery. Therefore, would not proceed with LHC. However, we could do RHC due better assess volume status and help [place]  know whether current respiratory issues are primarily due to CHF or pneumonia. Patient is agreeable to this. Will plan on RHC tomorrow.  The patient understands that risks include but are not limited to stroke (1 in 1000), death (1 in 72), kidney failure [usually temporary] (1 in 500), bleeding (1 in 200), allergic reaction [possibly serious] (1 in 200), and agrees to proceed.     New Onset Acute Combined CHF BNP mildly elevated at 198. Chest x-ray showed no acute findings. Echo showed LVEF of 35-40% with hypokinesis of the mid and distal anterior wall, mid and distal anterior septum, and entire apex (in distribution of LAD) as well as grade 1 diastolic dysfunction and mild aortic stenosis. She is currently being diuresed with IV Lasix. However, still net positive 2.068 L. Renal function stable with diuresis. - She still has bibasilar crackles and lower extremity edema on exam. - Agree with additional dose of IV Lasix '60mg'$  today.  - Currently on Lopressor '50mg'$  daily which she takes at home. Will transition to Toprol-XL '50mg'$  daily. - Will also start Hydralazine '100mg'$  three times daily and Imdur '15mg'$  daily. Will start on very low doses to make sure we kidneys remain well perfused and we do not causing worsening renal function. - No ACEi/ARB/ARNI, MRA, or SGLT2 inhibitor due to renal function. - Continue to  monitor daily weights, strict I/Os, and renal function. - Given wall motion abnormalities, suspect this is ischemic cardiomyopathy. However, unfortunately unable to proceed with LHC given renal function and patient is adamant she would not want dialysis. Therefore, will treat medically.  New Onset Atrial Fibrillation Presented in sinus rhythm but went into atrial fibrillation last night.  - Rates controlled.  - Will check EKG while in atrial fibrillation. - Will transition from Lopressor to Toprol-XL '50mg'$  daily as above given reduced EF. - CHA2DS2-VASc = 5 (CHF, HTN, DM, female, age). Will start IV Heparin in anticipation for upcoming amputation. After surgery, would transition to Eliquis.  Hypertension BP elevated. - Will transition from Lopressor to Toprol-XL and add Hydralazine/Imdur as above.  Hyperlipidemia LDL 64 in 03/2022. - Currently on home Lipitor '10mg'$  daily. Given presumed CAD based on Echo and diabetes, will increase to '40mg'$  daily in favor of high-intensity statin.  Acute on CKD Stage IV Creatinine 3.81 on admission. Baseline around 2.4 to 2.6. - Creatinine stable at 3.75 today with diuresis. - Continue to monitor closely. - Patient is adamant that she would not want dialysis. If renal function worsens, may need to consult Nephrology (previously followed them as an outpatient but has not seen them in a couple of years).  Otherwise, per primary team: - Sepsis secondary to osteomyelitis - Type 2 diabetes mellitus    Risk Assessment/Risk Scores:    New York Heart Association (NYHA) Functional Class NYHA Class III  CHA2DS2-VASc Score = 5  This indicates a 7.2% annual risk of stroke. The patient's score is based upon: CHF History: 1 HTN History: 1 Diabetes History: 1 Stroke History: 0 Vascular Disease History: 0 Age Score: 1 Gender Score: 1     For questions or updates, please contact [place] Please consult [email or web address] for contact info under     Signed, Darreld Mclean, PA-C  06/19/2022 3:34 PM

## 2022-06-19 NOTE — Consult Note (Signed)
Consultation Note Date: 06/19/2022   Patient Name: Courtney Schneider  DOB: 1945-08-01  MRN: [id number]  Age / Sex: 76 y.o., female  PCP: Pablo Lawrence, NP Referring Physician: Albertine Patricia, MD  Reason for Consultation: Establishing goals of care  HPI/Patient Profile: 76 y.o. female  with past medical history of diabetes, hypertension, hyperlipidemia, CKD stage 4, HFrEF EF 35-40%, anemia related to CKD, right transmetatarsal amputation admitted on 06/17/2022 with weakness and pain due to right foot ulcer with severe sepsis and acute osteomyelitis with necrotic tissue down to bone. Plans for transtibial amputation. Treating also for acute heart failure and possible pneumonia.   Clinical Assessment and Goals of Care: I received palliative referral and completed thorough chart review. I spoke with Dr. Waldron Labs regarding illness, prognosis, and plan forward. I met today with Ms. Sundberg, daughter Steffanie Dunn, son-in-law, and Dr. Silas Flood. Ms. Mincy is currently off BiPAP and sats staying in high 80s and with poor reserve but able to speak and converse. She understands that she has multiple serious health concerns with respiratory decline, heart failure and fluid overload, chronic kidney failure, and now in need of amputation with necrotic wound to foot. Ms. Metts is able to confirm DNR status and she would never want dialysis. She shares about other family members going through dialysis and amputations. She wants more time with her family and her dog but she values her quality of life. She understands her poor prognosis but wishes for continued medical support, surgery, and BiPAP as needed to help prolong life at current time. She is working with her daughter to express her wishes for funeral plans. Ultimately she desires to improve enough to return back to her home where she lives with her dog. If she  declines further she does not wish to suffer and would desire a peaceful death. At this time she wants to be hopeful for more time and she knows that her medical team will do all they can to support her.   Family at bedside very understanding of situation and poor prognosis.   All questions/concerns addressed. Emotional support provided.   Primary Decision Maker PATIENT    SUMMARY OF RECOMMENDATIONS   - DNR - No dialysis - Agrees with surgery/amputation - Hopeful for more time and improvement but understanding that she is high risk for poor outcome  Code Status/Advance Care Planning: DNR   Symptom Management:  Per attending, surgery, PCCM  Psycho-social/Spiritual:  Desire for further Chaplaincy support:yes - support from community pastor today; educated how to access spiritual care team  Prognosis:  Overall prognosis poor.   Discharge Planning: To Be Determined      Primary Diagnoses: Present on Admission:  HLD (hyperlipidemia)  HTN (hypertension)  Sepsis secondary to right foot ulcer  Acute renal failure superimposed on stage 4 chronic kidney disease ([place])   I have reviewed the medical record, interviewed the patient and family, and examined the patient. The following aspects are pertinent.  Past Medical History:  Diagnosis Date   Diabetes mellitus without complication (  [place])    Hypertension    Renal disorder    CKD stage 3   Social History   Socioeconomic History   Marital status: Married    Spouse name: Not on file   Number of children: Not on file   Years of education: Not on file   Highest education level: Not on file  Occupational History   Not on file  Tobacco Use   Smoking status: Never   Smokeless tobacco: Never  Vaping Use   Vaping Use: Never used  Substance and Sexual Activity   Alcohol use: No   Drug use: No   Sexual activity: Not on file  Other Topics Concern   Not on file  Social History Narrative   Not on file   Social  Determinants of Health   Financial Resource Strain: Not on file  Food Insecurity: No Food Insecurity (06/18/2022)   Hunger Vital Sign    Worried About Running Out of Food in the Last Year: Never true    Ran Out of Food in the Last Year: Never true  Transportation Needs: No Transportation Needs (06/18/2022)   PRAPARE - Hydrologist (Medical): No    Lack of Transportation (Non-Medical): No  Physical Activity: Not on file  Stress: Not on file  Social Connections: Not on file   History reviewed. No pertinent family history. Scheduled Meds:  atorvastatin  10 mg Oral Daily   chlorhexidine  60 mL Topical Once   heparin injection (subcutaneous)  5,000 Units Subcutaneous Q8H   insulin aspart  0-9 Units Subcutaneous TID WC   insulin glargine-yfgn  10 Units Subcutaneous QHS   melatonin  5 mg Oral QHS   metoprolol tartrate  50 mg Oral Daily   povidone-iodine  2 Application Topical Once   sodium bicarbonate  1,300 mg Oral BID   traZODone  50 mg Oral QHS   Continuous Infusions:  ceFEPime (MAXIPIME) IV 2 g (06/19/22 0933)   metronidazole 100 mL/hr at 06/19/22 0400   vancomycin     PRN Meds:.acetaminophen **OR** acetaminophen, HYDROcodone-acetaminophen, ondansetron **OR** ondansetron (ZOFRAN) IV, mouth rinse No Known Allergies Review of Systems  Constitutional:  Positive for activity change and appetite change.  Respiratory:  Positive for cough and shortness of breath.   Cardiovascular:  Positive for leg swelling.  Neurological:  Positive for weakness.    Physical Exam Vitals and nursing note reviewed.  Constitutional:      General: She is not in acute distress.    Appearance: She is ill-appearing.  Cardiovascular:     Rate and Rhythm: Normal rate. Rhythm irregularly irregular.     Comments: BLE edema present but much improved Pulmonary:     Effort: No tachypnea, accessory muscle usage or respiratory distress.     Comments: Sats 86-89%. Poor reserve at  rest.  Abdominal:     General: Abdomen is flat.  Neurological:     Mental Status: She is alert and oriented to person, place, and time.     Vital Signs: BP (!) 154/60 (BP Location: Right Arm)   Pulse 63   Temp 97.6 F (36.4 C) (Oral)   Resp (!) 22   Ht _0  (1.626 m)   Wt 113.4 kg   SpO2 98%   BMI 42.91 kg/m  Pain Scale: 0-10   Pain Score: 0-No pain   SpO2: SpO2: 98 % O2 Device:SpO2: 98 % O2 Flow Rate: .O2 Flow Rate (L/min): 15 L/min  IO: Intake/output summary:  Intake/Output Summary (Last 24 hours) at 06/19/2022 0940 Last data filed at 06/19/2022 0400 Gross per 24 hour  Intake 505.3 ml  Output 600 ml  Net -94.7 ml    LBM: Last BM Date : 06/17/22 Baseline Weight: Weight: 113.4 kg Most recent weight: Weight: 113.4 kg     Palliative Assessment/Data:     Time In: 0930  Time Total: 75 min  Greater than 50%  of this time was spent counseling and coordinating care related to the above assessment and plan.  Signed by: Vinie Sill, NP Palliative Medicine Team Pager # [phone] (M-F 8a-5p) Team Phone # [phone] (Nights/Weekends)

## 2022-06-19 NOTE — Progress Notes (Signed)
PHARMACY - PHYSICIAN COMMUNICATION CRITICAL VALUE ALERT - BLOOD CULTURE IDENTIFICATION (BCID)  Courtney Schneider is an 76 y.o. female who presented to [facility] on 06/17/2022 with a chief complaint of weakness  Assessment:  Patient admitted for sepsis from her right foot ulcer.  BKA planned for tomorrow.  One set of blood cultures growing GPC in clusters, BCID negative.  Suspect contamination  Name of physician (or Provider) Contacted: Dr. Waldron Labs  Current antibiotics: cefepime, metronidazole, vancomycin  Changes to prescribed antibiotics recommended: None   Results for orders placed or performed during the hospital encounter of 06/17/22  Blood Culture ID Panel (Reflexed) (Collected: 06/17/2022  1:14 PM)  Result Value Ref Range   Enterococcus faecalis NOT DETECTED NOT DETECTED   Enterococcus Faecium NOT DETECTED NOT DETECTED   Listeria monocytogenes NOT DETECTED NOT DETECTED   Staphylococcus species NOT DETECTED NOT DETECTED   Staphylococcus aureus (BCID) NOT DETECTED NOT DETECTED   Staphylococcus epidermidis NOT DETECTED NOT DETECTED   Staphylococcus lugdunensis NOT DETECTED NOT DETECTED   Streptococcus species NOT DETECTED NOT DETECTED   Streptococcus agalactiae NOT DETECTED NOT DETECTED   Streptococcus pneumoniae NOT DETECTED NOT DETECTED   Streptococcus pyogenes NOT DETECTED NOT DETECTED   A.calcoaceticus-baumannii NOT DETECTED NOT DETECTED   Bacteroides fragilis NOT DETECTED NOT DETECTED   Enterobacterales NOT DETECTED NOT DETECTED   Enterobacter cloacae complex NOT DETECTED NOT DETECTED   Escherichia coli NOT DETECTED NOT DETECTED   Klebsiella aerogenes NOT DETECTED NOT DETECTED   Klebsiella oxytoca NOT DETECTED NOT DETECTED   Klebsiella pneumoniae NOT DETECTED NOT DETECTED   Proteus species NOT DETECTED NOT DETECTED   Salmonella species NOT DETECTED NOT DETECTED   Serratia marcescens NOT DETECTED NOT DETECTED   Haemophilus influenzae NOT DETECTED NOT DETECTED    Neisseria meningitidis NOT DETECTED NOT DETECTED   Pseudomonas aeruginosa NOT DETECTED NOT DETECTED   Stenotrophomonas maltophilia NOT DETECTED NOT DETECTED   Candida albicans NOT DETECTED NOT DETECTED   Candida auris NOT DETECTED NOT DETECTED   Candida glabrata NOT DETECTED NOT DETECTED   Candida krusei NOT DETECTED NOT DETECTED   Candida parapsilosis NOT DETECTED NOT DETECTED   Candida tropicalis NOT DETECTED NOT DETECTED   Cryptococcus neoformans/gattii NOT DETECTED NOT DETECTED    Candie Mile 06/19/2022  2:59 PM

## 2022-06-19 NOTE — Progress Notes (Signed)
PROGRESS NOTE    Courtney Schneider  GEX:[id number] DOB: [birth year] DOA: 06/17/2022 PCP: Pablo Lawrence, NP   Chief Complaint  Patient presents with   Code Sepsis    Brief Narrative:   Courtney Schneider is a 76 y.o. female with medical history significant of T2DM, HTN,  CKD stage 4, CHF, anemia of CKD who presented to ED with complaints of generalized weakness for several weakness and increasing pain and swelling to her right foot ulcer. She has been sick for a few weeks. She hasn't been eating well, her legs have been more swollen and her daughter decided to bring her to the Ed. She has been having chills, it is neuropathic, daughter noted large ulcer right foot, brought her to ED, work-up significant for severe sepsis due to foot osteomyelitis, and possibly bacteremia, respiratory status is very tenuous, she was seen by Dr. Sharol Given with plan for BKA.    Assessment & Plan:   Principal Problem:   Sepsis secondary to right foot ulcer Active Problems:   Acute respiratory failure with hypoxia (HCC)   Acute renal failure superimposed on stage 4 chronic kidney disease (HCC)   Insulin dependent type 2 diabetes mellitus (HCC)   Chronic diastolic CHF (congestive heart failure) (HCC)   HTN (hypertension)   HLD (hyperlipidemia)   Right foot ulcer (HCC)   Subacute osteomyelitis, right ankle and foot (HCC)   Severe sepsis present on admission due to right foot ulcer /acute osteomyelitis  76 year old presenting with 3 week history of general feeling ill, poor PO intake, weakness found to be septic from right foot ulcer that is foul smelling, RI confirms osteomyelitis. -Continue with IV vancomycin and cefepime -Blood cultures are positive for gram-positive cocci, unclear 1/4 or 2/4 bottles, BC ID is pending. -Orthopedic input greatly appreciated, plan for BKA on tomorrow -Continue with as needed pain medications -Patient is high risk for surgery given her respiratory and cardiac issues, both  services has been consulted regarding further recommendations, as well palliative medicine has been consulted.    Acute respiratory failure with hypoxia (HCC) likely related to volume overload and possible pneumonia - Was satting 97% on RA and acutely became hypoxic to 80% and tachypneic but denied any shortness of breath -Recurrent respiratory failure requiring BiPAP intermittently, currently on 15 L oxygen nasal cannula -Events of volume overload, continue with IV diuresis -Incentive spirometer -VQ scan with no evidence of PE, stopped IV heparin -Continue with IV diuresis  -Need to treat for bibasilar pneumonia -PCCM consulted  Acute on chronic /systolic diastolic CHF (congestive heart failure) ([place]) Last echo in 2018 and normal EF with grade 1 DD and preserved EF, echo this admission with EF 35%, cardiology consulted for further management -Continue with IV Lasix, daily weights, strict ins and out.   Acute renal failure superimposed on stage 4 chronic kidney disease ([place]) Labs from 03/2022 show her creatinine at 3.10, up to 3.8 today Likely secondary to sepsis Hold nephrotoxic drugs Closely as on IV diuresis Will increase her PO  sodium bicarb  Insulin dependent type 2 diabetes mellitus ([place]) Well controlled. A1C in 03/2022 was 6.5 Has not had her insulin in past few days. Decrease dose down to 10 units daily (normally on 26 units)  Sensitive SSI and accuchecks qac/hs     HTN (hypertension) Has been normotensive to elevated Continue metoprolol, but hold if becomes hypotensive    HLD (hyperlipidemia) Continue pravastatin    Morbid obesity Body mass index is 42.91 kg/m.  DVT prophylaxis: Heparin Code Status: Full code Family Communication: D/W daughter at bedside daily Disposition:   Status is: Inpatient    Consultants:  Orthopedic Dr Luane School Cardiology PCCM   Subjective:  With respiratory distress, increased work of breathing overnight requiring  to be started on BiPAP. Objective: Vitals:   06/19/22 0842 06/19/22 0930 06/19/22 1000 06/19/22 1200  BP:   (!) 158/129 (!) 150/87  Pulse: 63  73 72  Resp: (!) '22  20 15  '$ Temp:    99.5 F (37.5 C)  TempSrc:    Oral  SpO2: 98% 91% (!) 88% (!) 87%  Weight:      Height:        Intake/Output Summary (Last 24 hours) at 06/19/2022 1308 Last data filed at 06/19/2022 0400 Gross per 24 hour  Intake 505.3 ml  Output 600 ml  Net -94.7 ml   Filed Weights   06/17/22 1225  Weight: 113.4 kg    Examination:    Awake Alert, Oriented X 3, tremulous frail, chronically ill-appearing, deconditioned Symmetrical Chest wall movement, diminished air entry at the bases bilaterally RRR,No Gallops,Rubs or new Murmurs, No Parasternal Heave +ve B.Sounds, Abd Soft, No tenderness, No rebound - guarding or rigidity. Foot with TMA, Bandaged   Data Reviewed: I have personally reviewed following labs and imaging studies  CBC: Recent Labs  Lab 06/17/22 1314 06/18/22 0105 06/19/22 0350  WBC 20.9* 22.6* 18.6*  NEUTROABS 18.0*  --   --   HGB 9.9* 9.1* 8.8*  HCT 31.3* 29.2* 27.3*  MCV 96.3 98.0 95.5  PLT 376 353 161    Basic Metabolic Panel: Recent Labs  Lab 06/17/22 1314 06/18/22 0105 06/19/22 0314  NA 134* 134* 137  K 4.7 4.3 5.0  CL 105 104 103  CO2 16* 17* 14*  GLUCOSE 168* 134* 149*  BUN 54* 51* 59*  CREATININE 3.81* 3.62* 3.75*  CALCIUM 8.8* 8.6* 8.6*    GFR: Estimated Creatinine Clearance: 16.2 mL/min (A) (by C-G formula based on SCr of 3.75 mg/dL (H)).  Liver Function Tests: Recent Labs  Lab 06/17/22 1314 06/18/22 0105  AST 16 16  ALT 8 7  ALKPHOS 87 71  BILITOT 0.6 0.4  PROT 7.3 5.9*  ALBUMIN 3.2* 2.3*    CBG: Recent Labs  Lab 06/18/22 1646 06/18/22 2030 06/19/22 0626 06/19/22 0815 06/19/22 1224  GLUCAP 131* 148* 134* 133* 117*     Recent Results (from the past 240 hour(s))  Resp Panel by RT-PCR (Flu A&B, Covid) Anterior Nasal Swab     Status: None    Collection Time: 06/17/22  1:07 PM   Specimen: Anterior Nasal Swab  Result Value Ref Range Status   SARS Coronavirus 2 by RT PCR NEGATIVE NEGATIVE Final    Comment: (NOTE) SARS-CoV-2 target nucleic acids are NOT DETECTED.  The SARS-CoV-2 RNA is generally detectable in upper respiratory specimens during the acute phase of infection. The lowest concentration of SARS-CoV-2 viral copies this assay can detect is 138 copies/mL. A negative result does not preclude SARS-Cov-2 infection and should not be used as the sole basis for treatment or other patient management decisions. A negative result may occur with  improper specimen collection/handling, submission of specimen other than nasopharyngeal swab, presence of viral mutation(s) within the areas targeted by this assay, and inadequate number of viral copies(<138 copies/mL). A negative result must be combined with clinical observations, patient history, and epidemiological information. The expected result is Negative.  Fact Sheet for Patients:  EntrepreneurPulse.com.au  Fact Sheet for Healthcare Providers:  IncredibleEmployment.be  This test is no t yet approved or cleared by the [place] FDA and  has been authorized for detection and/or diagnosis of SARS-CoV-2 by FDA under an Emergency Use Authorization (EUA). This EUA will remain  in effect (meaning this test can be used) for the duration of the COVID-19 declaration under Section 564(b)(1) of the Act, 21 U.S.C.section 360bbb-3(b)(1), unless the authorization is terminated  or revoked sooner.       Influenza A by PCR NEGATIVE NEGATIVE Final   Influenza B by PCR NEGATIVE NEGATIVE Final    Comment: (NOTE) The Xpert Xpress SARS-CoV-2/FLU/RSV plus assay is intended as an aid in the diagnosis of influenza from Nasopharyngeal swab specimens and should not be used as a sole basis for treatment. Nasal washings and aspirates are unacceptable for  Xpert Xpress SARS-CoV-2/FLU/RSV testing.  Fact Sheet for Patients: EntrepreneurPulse.com.au  Fact Sheet for Healthcare Providers: IncredibleEmployment.be  This test is not yet approved or cleared by the [place] FDA and has been authorized for detection and/or diagnosis of SARS-CoV-2 by FDA under an Emergency Use Authorization (EUA). This EUA will remain in effect (meaning this test can be used) for the duration of the COVID-19 declaration under Section 564(b)(1) of the Act, 21 U.S.C. section 360bbb-3(b)(1), unless the authorization is terminated or revoked.  Performed at [facility], [address] [place], [place] [address]   Culture, blood (Routine x 2)     Status: None (Preliminary result)   Collection Time: 06/17/22  1:14 PM   Specimen: Right Antecubital; Blood  Result Value Ref Range Status   Specimen Description   Final    RIGHT ANTECUBITAL BOTTLES DRAWN AEROBIC AND ANAEROBIC   Special Requests Blood Culture adequate volume  Final   Culture  Setup Time   Final    GRAM POSITIVE COCCI WBC PRESENT,BOTH PMN AND MONONUCLEAR CYTOSPIN SMEAR RESULT CALLED TO, READ BACK BY AND VERIFIED WITH: called J. Teffer RN at [facility]  on 06/19/22 R.Byrd   Culture   Final    NO GROWTH 2 DAYS Performed at [facility], [address], [place], [place] [address]    Report Status PENDING  Incomplete  Culture, blood (Routine x 2)     Status: None (Preliminary result)   Collection Time: 06/17/22  1:14 PM   Specimen: Left Antecubital; Blood  Result Value Ref Range Status   Specimen Description   Final    LEFT ANTECUBITAL BOTTLES DRAWN AEROBIC AND ANAEROBIC   Special Requests   Final    Blood Culture results may not be optimal due to an excessive volume of blood received in culture bottles   Culture   Final    NO GROWTH 2 DAYS Performed at [facility], [address], [place], [place] [address]    Report Status PENDING  Incomplete  Urine Culture      Status: Abnormal (Preliminary result)   Collection Time: 06/17/22  8:38 PM   Specimen: In/Out Cath Urine  Result Value Ref Range Status   Specimen Description IN/OUT CATH URINE  Final   Special Requests NONE  Final   Culture (A)  Final    10,000 COLONIES/mL ESCHERICHIA COLI SUSCEPTIBILITIES TO FOLLOW Performed at [facility], [place] [address], [place], [place] [address]    Report Status PENDING  Incomplete         Radiology Studies: DG Chest Port 1 View  Result Date: 06/19/2022 CLINICAL DATA:  Dyspnea EXAM: PORTABLE  CHEST 1 VIEW COMPARISON:  06/17/2022 chest radiograph. FINDINGS: Stable cardiomediastinal silhouette with mild cardiomegaly. No pneumothorax. No pleural effusion. Diffuse hazy patchy parahilar lung opacities, most prominent in the upper lungs, worsened. IMPRESSION: Stable mild cardiomegaly. Worsened diffuse hazy patchy parahilar lung opacities, most prominent in the upper lungs, favor pulmonary edema. Electronically Signed   By: Ilona Sorrel M.D.   On: 06/19/2022 08:31   ECHOCARDIOGRAM COMPLETE  Result Date: 06/18/2022    ECHOCARDIOGRAM REPORT   Patient Name:   Courtney Schneider Date of Exam: 06/18/2022 Medical Rec #:  [id number]          Height:       64.0 in Accession #:    [id number]         Weight:       250.0 lb Date of Birth:  1945-11-08         BSA:          2.151 m Patient Age:    37 years           BP:           139/62 mmHg Patient Gender: F                  HR:           75 bpm. Exam Location:  Inpatient Procedure: 2D Echo, Cardiac Doppler, Color Doppler and Intracardiac            Opacification Agent Indications:    CHF-Acute Diastolic H60.73  History:        Patient has no prior history of Echocardiogram examinations.                 CHF, Sepsis; Risk Factors:Hypertension, Dyslipidemia, Diabetes                 and Non-Smoker.  Sonographer:    Greer Pickerel Referring Phys: [id number] Orma Flaming  Sonographer Comments: Technically difficult study due to poor echo  windows. Image acquisition challenging due to COPD and Image acquisition challenging due to patient body habitus. IMPRESSIONS  1. Left ventricular ejection fraction, by estimation, is 35 to 40%. The left ventricle has moderately decreased function. The left ventricle demonstrates regional wall motion abnormalities (see scoring diagram/findings for description). There is mild concentric left ventricular hypertrophy. Left ventricular diastolic parameters are consistent with Grade I diastolic dysfunction (impaired relaxation). Elevated left ventricular end-diastolic pressure.  2. Right ventricular systolic function is normal. The right ventricular size is normal. There is mildly elevated pulmonary artery systolic pressure.  3. Left atrial size was moderately dilated.  4. The mitral valve is normal in structure. Trivial mitral valve regurgitation. No evidence of mitral stenosis.  5. The aortic valve is tricuspid. There is mild calcification of the aortic valve. There is mild thickening of the aortic valve. Aortic valve regurgitation is not visualized. Mild aortic valve stenosis. Aortic valve area, by VTI measures 1.55 cm. Aortic valve mean gradient measures 12.0 mmHg. Aortic valve Vmax measures 2.20 m/s.  6. The inferior vena cava is dilated in size with >50% respiratory variability, suggesting right atrial pressure of 8 mmHg. FINDINGS  Left Ventricle: Left ventricular ejection fraction, by estimation, is 35 to 40%. The left ventricle has moderately decreased function. The left ventricle demonstrates regional wall motion abnormalities. Definity contrast agent was given IV to delineate the left ventricular endocardial borders. The left ventricular internal cavity size was normal in size. There is mild concentric left ventricular hypertrophy. Left ventricular diastolic parameters  are consistent with Grade I diastolic dysfunction (impaired relaxation). Elevated left ventricular end-diastolic pressure.  LV Wall Scoring:  The mid and distal anterior wall, mid and distal anterior septum, and entire apex are hypokinetic. The antero-lateral wall, inferior wall, posterior wall, basal anteroseptal segment, mid inferoseptal segment, basal anterior segment, and basal inferoseptal segment are normal. Right Ventricle: The right ventricular size is normal. No increase in right ventricular wall thickness. Right ventricular systolic function is normal. There is mildly elevated pulmonary artery systolic pressure. The tricuspid regurgitant velocity is 2.65  m/s, and with an assumed right atrial pressure of 8 mmHg, the estimated right ventricular systolic pressure is 27.7 mmHg. Left Atrium: Left atrial size was moderately dilated. Right Atrium: Right atrial size was normal in size. Pericardium: There is no evidence of pericardial effusion. Mitral Valve: The mitral valve is normal in structure. Trivial mitral valve regurgitation. No evidence of mitral valve stenosis. Tricuspid Valve: The tricuspid valve is normal in structure. Tricuspid valve regurgitation is trivial. No evidence of tricuspid stenosis. Aortic Valve: The aortic valve is tricuspid. There is mild calcification of the aortic valve. There is mild thickening of the aortic valve. Aortic valve regurgitation is not visualized. Mild aortic stenosis is present. Aortic valve mean gradient measures  12.0 mmHg. Aortic valve peak gradient measures 19.4 mmHg. Aortic valve area, by VTI measures 1.55 cm. Pulmonic Valve: The pulmonic valve was normal in structure. Pulmonic valve regurgitation is not visualized. No evidence of pulmonic stenosis. Aorta: The aortic root is normal in size and structure. Venous: The inferior vena cava is dilated in size with greater than 50% respiratory variability, suggesting right atrial pressure of 8 mmHg. IAS/Shunts: No atrial level shunt detected by color flow Doppler.  LEFT VENTRICLE PLAX 2D LVIDd:         4.00 cm      Diastology LVIDs:         3.30 cm      LV e'  medial:    6.09 cm/s LV PW:         1.20 cm      LV E/e' medial:  17.6 LV IVS:        1.10 cm      LV e' lateral:   3.92 cm/s LVOT diam:     1.90 cm      LV E/e' lateral: 27.3 LV SV:         76 LV SV Index:   35 LVOT Area:     2.84 cm  LV Volumes (MOD) LV vol d, MOD A2C: 146.0 ml LV vol d, MOD A4C: 162.0 ml LV vol s, MOD A2C: 41.4 ml LV vol s, MOD A4C: 110.0 ml LV SV MOD A2C:     104.6 ml LV SV MOD A4C:     162.0 ml LV SV MOD BP:      85.0 ml RIGHT VENTRICLE RV S prime:     9.79 cm/s TAPSE (M-mode): 2.0 cm LEFT ATRIUM           Index        RIGHT ATRIUM           Index LA diam:      3.90 cm 1.81 cm/m   RA Area:     19.90 cm LA Vol (A2C): 82.3 ml 38.26 ml/m  RA Volume:   61.10 ml  28.41 ml/m LA Vol (A4C): 42.6 ml 19.80 ml/m  AORTIC VALVE AV Area (Vmax):    1.53 cm AV Area (Vmean):  1.49 cm AV Area (VTI):     1.55 cm AV Vmax:           220.50 cm/s AV Vmean:          159.000 cm/s AV VTI:            0.489 m AV Peak Grad:      19.4 mmHg AV Mean Grad:      12.0 mmHg LVOT Vmax:         119.00 cm/s LVOT Vmean:        83.500 cm/s LVOT VTI:          0.267 m LVOT/AV VTI ratio: 0.55  AORTA Ao Root diam: 3.30 cm Ao Asc diam:  3.10 cm MITRAL VALVE                TRICUSPID VALVE MV Area (PHT): 3.21 cm     TR Peak grad:   28.1 mmHg MV Decel Time: 236 msec     TR Vmax:        265.00 cm/s MV E velocity: 107.00 cm/s                             SHUNTS                             Systemic VTI:  0.27 m                             Systemic Diam: 1.90 cm Skeet Latch MD Electronically signed by Skeet Latch MD Signature Date/Time: 06/18/2022/6:03:27 PM    Final    VAS [place] LOWER EXTREMITY VENOUS (DVT)  Result Date: 06/18/2022  Lower Venous DVT Study Patient Name:  Courtney Schneider  Date of Exam:   06/18/2022 Medical Rec #: [id number]           Accession #:    [id number] Date of Birth: [birth year]          Patient Gender: F Patient Age:   40 years Exam Location:  [facility] Procedure:      VAS [place] LOWER  EXTREMITY VENOUS (DVT) Referring Phys: Deiona Hooper --------------------------------------------------------------------------------  Indications: Pain, and Edema.  Limitations: Body habitus, poor ultrasound/tissue interface and pain intolerance. Comparison Study: No previous exams Performing Technologist: Jody Hill RVT, RDMS  Examination Guidelines: A complete evaluation includes B-mode imaging, spectral Doppler, color Doppler, and power Doppler as needed of all accessible portions of each vessel. Bilateral testing is considered an integral part of a complete examination. Limited examinations for reoccurring indications may be performed as noted. The reflux portion of the exam is performed with the patient in reverse Trendelenburg.  +--------+---------------+---------+-----------+----------+--------------------+ RIGHT   CompressibilityPhasicitySpontaneityPropertiesThrombus Aging       +--------+---------------+---------+-----------+----------+--------------------+ CFV     Full           Yes      Yes                                       +--------+---------------+---------+-----------+----------+--------------------+ SFJ     Full                                                              +--------+---------------+---------+-----------+----------+--------------------+  FV Prox Full           Yes      Yes                                       +--------+---------------+---------+-----------+----------+--------------------+ FV Mid  Full           Yes      Yes                                       +--------+---------------+---------+-----------+----------+--------------------+ FV                     Yes      Yes                  patent by            Distal                                               color/doppler        +--------+---------------+---------+-----------+----------+--------------------+ PFV     Full                                                               +--------+---------------+---------+-----------+----------+--------------------+ POP     Full                                                              +--------+---------------+---------+-----------+----------+--------------------+ PTV     Full                                         Not well visualized  +--------+---------------+---------+-----------+----------+--------------------+ PERO    Full                                         Not well visualized  +--------+---------------+---------+-----------+----------+--------------------+   +---------+---------------+---------+-----------+----------+-------------------+ LEFT     CompressibilityPhasicitySpontaneityPropertiesThrombus Aging      +---------+---------------+---------+-----------+----------+-------------------+ CFV      Full           Yes      Yes                                      +---------+---------------+---------+-----------+----------+-------------------+ SFJ      Full                                                             +---------+---------------+---------+-----------+----------+-------------------+  FV Prox  Full           Yes      Yes                                      +---------+---------------+---------+-----------+----------+-------------------+ FV Mid   Full           Yes      Yes                                      +---------+---------------+---------+-----------+----------+-------------------+ FV DistalFull           Yes      Yes                                      +---------+---------------+---------+-----------+----------+-------------------+ PFV      Full                                                             +---------+---------------+---------+-----------+----------+-------------------+ POP      Full           Yes      Yes                                       +---------+---------------+---------+-----------+----------+-------------------+ PTV      Full                                         Not well visualized +---------+---------------+---------+-----------+----------+-------------------+ PERO     Full                                         Not well visualized +---------+---------------+---------+-----------+----------+-------------------+     Summary: BILATERAL: - No evidence of deep vein thrombosis seen in the lower extremities in areas visualized, bilaterally. -No evidence of popliteal cyst, bilaterally.   *See table(s) above for measurements and observations. Electronically signed by Servando Snare MD on 06/18/2022 at 5:31:54 PM.    Final    NM Pulmonary Perfusion  Result Date: 06/18/2022 CLINICAL DATA:  76 year old female with shortness of breath. EXAM: NUCLEAR MEDICINE PERFUSION LUNG SCAN TECHNIQUE: Perfusion images were obtained in multiple projections after intravenous injection of radiopharmaceutical. Ventilation scans intentionally deferred if perfusion scan and chest x-ray adequate for interpretation during COVID 19 epidemic. RADIOPHARMACEUTICALS:  4.1 mCi Tc-53mMAA IV COMPARISON:  Portable chest x-ray yesterday 1647 hours. FINDINGS: Homogeneous perfusion radiotracer activity in both lungs when accounting for mediastinal - and on the lateral views upper extremity related photopenia. No convincing perfusion defect. IMPRESSION: Normal perfusion, no evidence of pulmonary embolus. Electronically Signed   By: HGenevie AnnM.D.   On: 06/18/2022 10:16   MR FOOT RIGHT WO CONTRAST  Result Date: 06/18/2022 CLINICAL DATA:  Diabetic right foot ulcer. History of prior amputation.  EXAM: MRI OF THE RIGHT FOREFOOT WITHOUT CONTRAST TECHNIQUE: Multiplanar, multisequence MR imaging of the right foot was performed. No intravenous contrast was administered. COMPARISON:  Right foot x-rays from same day. MRI right foot dated Dec 24, 2017. FINDINGS:  Bones/Joint/Cartilage Prior transmetatarsal amputation. Abnormal marrow edema with early decreased T1 marrow signal involving the residual fourth and fifth metatarsal bases. No fracture or dislocation. Joint spaces are preserved. No joint effusion. Ligaments Medial and lateral ankle ligaments are grossly intact. Muscles and Tendons No tenosynovitis. Soft tissue Lateral plantar midfoot soft tissue ulceration adjacent to the residual fifth metatarsal base with subcutaneous emphysema but no fluid collection. Diffuse soft tissue swelling. No soft tissue mass. IMPRESSION: 1. Lateral plantar midfoot soft tissue ulceration adjacent to the residual fifth metatarsal base with underlying osteomyelitis of the residual fourth and fifth metatarsal bases. No abscess. Electronically Signed   By: Titus Dubin M.D.   On: 06/18/2022 07:53   DG CHEST PORT 1 VIEW  Result Date: 06/17/2022 CLINICAL DATA:  Acute respiratory failure.  Hypoxia. EXAM: PORTABLE CHEST 1 VIEW COMPARISON:  June 17, 2022 FINDINGS: Stable cardiomegaly. The hila and mediastinum are normal. Bilateral hazy opacities are identified, more focal in the bases, right greater than left. No pneumothorax. No nodule or mass. IMPRESSION: 1. Cardiomegaly. 2. Bilateral hazy opacities, more focal in the bases, right greater than left. Findings could represent pulmonary edema or multifocal infection. Recommend clinical correlation and follow-up to resolution. Electronically Signed   By: Dorise Bullion III M.D.   On: 06/17/2022 16:57   DG Foot 2 Views Right  Result Date: 06/17/2022 CLINICAL DATA:  Diabetic foot. EXAM: RIGHT FOOT - 2 VIEW COMPARISON:  None Available. FINDINGS: Post amputation midfoot level. Gas ulceration in the stomach. Osseous erosion. Potential skin ulceration posterior to the calcaneus. No subcutaneous gas identified. No osseous erosion. IMPRESSION: No osseous erosions to localize acute osteomyelitis. Electronically Signed   By: Suzy Bouchard M.D.   On: 06/17/2022 14:24        Scheduled Meds:  atorvastatin  10 mg Oral Daily   chlorhexidine  60 mL Topical Once   heparin injection (subcutaneous)  5,000 Units Subcutaneous Q8H   insulin aspart  0-9 Units Subcutaneous TID WC   insulin glargine-yfgn  10 Units Subcutaneous QHS   melatonin  5 mg Oral QHS   metoprolol tartrate  50 mg Oral Daily   povidone-iodine  2 Application Topical Once   sodium bicarbonate  1,300 mg Oral BID   traZODone  50 mg Oral QHS   Continuous Infusions:  ceFEPime (MAXIPIME) IV 2 g (06/19/22 0933)   metronidazole 100 mL/hr at 06/19/22 0400   vancomycin       LOS: 2 days        Phillips Climes, MD [facility]

## 2022-06-19 NOTE — Progress Notes (Signed)
   06/18/22 2100  Assess: MEWS Score  Pulse Rate 80  ECG Heart Rate 80  Resp (!) 25  SpO2 (!) 78 %  Assess: MEWS Score  MEWS Temp 0  MEWS Systolic 0  MEWS Pulse 0  MEWS RR 1  MEWS LOC 0  MEWS Score 1  MEWS Score Color Green  Assess: if the MEWS score is Yellow or Red  Were vital signs taken at a resting state? Yes  Focused Assessment No change from prior assessment  Does the patient meet 2 or more of the SIRS criteria? Yes  Does the patient have a confirmed or suspected source of infection? Yes  Provider and Rapid Response Notified? No (Provider notified)  MEWS guidelines implemented *See Row Information* No, previously yellow, continue vital signs every 4 hours  Treat  MEWS Interventions Escalated (See documentation below);Consulted Respiratory Therapy  Pain Scale 0-10  Pain Score 0  Notify: Charge Nurse/RN  Name of Charge Nurse/RN Notified Janie RN  Date Charge Nurse/RN Notified 06/18/22  Time Charge Nurse/RN Notified 2100  Provider Notification  Provider Name/Title Mitzi Hansen MD  Date Provider Notified 06/18/22  Time Provider Notified 2100  Method of Notification Call  Notification Reason Change in status (Rhythm changed to Afib controlled/Hypoxia requiring urgent BiPap/Tachypnea/Labored/ AMS/agitated/Coarse Lung sounds)  Provider response See new orders (Bipap order/ABG/Lasix)  Date of Provider Response 06/18/22  Time of Provider Response 2100  Document  Patient Outcome Stabilized after interventions (Oxygen saturations>92/pulls BiPaP mask off frequently)  Assess: SIRS CRITERIA  SIRS Temperature  0  SIRS Pulse 0  SIRS Respirations  1  SIRS WBC 1  SIRS Score Sum  2

## 2022-06-19 NOTE — H&P (View-Only) (Signed)
Cardiology Consultation   Patient ID: DHANI IMEL MRN: [id number]; DOB: 1946-02-14  Admit date: 06/17/2022 Date of Consult: 06/19/2022  PCP:  Pablo Lawrence, NP   [facility] Cardiologist:  [facility] (Dr. Martinique)  Patient Profile:   Courtney Schneider is a 76 y.o. female with a history of hypertension, hyperlipidemia, type 2 diabetes mellitus, CKD stage IV, and s/p right transmetatarsal amputation secondary to osteomyelitis in 2010  who is being seen 06/19/2022 for pre-op evaluation for right below knee amputation at the request of Dr. Waldron Labs.   History of Present Illness:   Courtney Schneider is a 76 year old female with the above history.  No known cardiac history prior to admission.  Patient ED on 06/17/2022 for evaluation of weakness as well as increasing pain and swelling of her right foot was found to be septic from a right foot ulcer.  Foot x-ray showed no osseous erosions to localize acute osteomyelitis.  MRI foot showed lateral plantar midfoot soft tissue ulceration adjacent to the residual fifth metatarsal base with underlying osteomyelitis of the residual fourth and fifth metatarsal bases.  She was started on IV fluids and antibiotics while in the ED, she became acutely hypoxic with O2 sats in the 80s and tachypneic but denied feeling short of breath.  Chest x-ray showed no acute findings.  BNP elevated at 198.  She started on IV heparin due to concern for possible PE.  D-dimer was elevated at 3.31.  VQ scan was ordered and showed normal perfusion with no evidence of PE.  Lower extremity Dopplers were negative for DVT.  Echo showed LVEF of 35-40% with hypokinesis of the mid and distal anterior wall, mid and distal anterior septum, and entire apex as well as grade 1 diastolic dysfunction and mild aortic stenosis.  She is currently being treated for acute CHF as well as pneumonia. She actually ended up requiring BiPAP last night due to decline in respiratory  status.   Ortho ultimately saw the patient and has recommended a transtibial below-knee amputation tomorrow.  Therefore, Cardiology has been consulted for preop evaluation in setting of acute CHF.  At the time of this evaluation, patient O2 sats are in the high 80s on 15 L of HFNC. She also appears to be in new onset atrial fibrillation. She states she that over the last month she has just not been feeling well at all. She described decreased appetite and weight loss. However, she reported worsening lower extremity edema over the last 3 months as well as worsening shortness of breath over the last month. She has some chronic dyspnea on exertion but states it has worsened over the last month and she gets short of breath after walking short distances such as from one room to the other. She denies any real shortness of breath at rest. She has been sleeping a recliner lately but she states this is because she likes to "stretch out." She does not describe orthopnea or any recent PND. She denies any chest pain, palpitations, lightheadedness, dizziness, or syncope. It sounds like she is pretty sedentary. She states she often weights in the car when friends or family take her to the grocery store because it is difficult for her to get around. This is largely due to her prior transmetatarsal amputation. She has multiple falls due to balance issues from this. She reports some body chills for about 1 week prior to presentation and had a low grade fever on admission. She also reports a recent productive  cough. She has intermittent diarrhea but no other GI symptoms. No abnormal bleeding in urine or stools.  She denies any smoking history. She thinks her maternal grandmother had some type of heart disease but unclear what. No other known family history of heart disease.  Past Medical History:  Diagnosis Date   Diabetes mellitus without complication ([place])    Hypertension    Renal disorder    CKD stage 3    Past  Surgical History:  Procedure Laterality Date   AMPUTATION TOE     CHOLECYSTECTOMY       Home Medications:  Prior to Admission medications   Medication Sig Start Date End Date Taking? Authorizing Provider  acetaminophen (TYLENOL) 500 MG tablet Take 1,000 mg by mouth every 6 (six) hours as needed for moderate pain.   Yes [provider]  albuterol (VENTOLIN HFA) 108 (90 Base) MCG/ACT inhaler Inhale into the lungs. 11/10/21  Yes [provider]  amLODipine-valsartan (EXFORGE) 10-160 MG tablet Take 1 tablet by mouth daily. Resume on 5/31 12/27/17  Yes Barton Dubois, MD  aspirin EC 81 MG tablet Take 81 mg by mouth daily.   Yes [provider]  atorvastatin (LIPITOR) 10 MG tablet Take 10 mg by mouth daily.   Yes [provider]  cholecalciferol (VITAMIN D3) 25 MCG (1000 UNIT) tablet Take by mouth.   Yes [provider]  diphenhydramine-acetaminophen (TYLENOL PM) 25-500 MG TABS tablet Take 1 tablet by mouth at bedtime as needed (sleep).   Yes [provider]  furosemide (LASIX) 40 MG tablet Take 40 mg by mouth.   Yes [provider]  insulin glargine (LANTUS) 100 UNIT/ML injection Inject 30 Units into the skin at bedtime.   Yes [provider]  metoprolol (LOPRESSOR) 50 MG tablet Take 50 mg by mouth daily.   Yes [provider]  Multiple Vitamins-Minerals (PRESERVISION AREDS PO) Take 1 tablet by mouth daily.   Yes [provider]  sodium bicarbonate 650 MG tablet Take 1 tablet by mouth daily. 04/10/19  Yes [provider]    Inpatient Medications: Scheduled Meds:  atorvastatin  10 mg Oral Daily   chlorhexidine  60 mL Topical Once   furosemide  60 mg Intravenous BID   heparin injection (subcutaneous)  5,000 Units Subcutaneous Q8H   insulin aspart  0-9 Units Subcutaneous TID WC   insulin glargine-yfgn  10 Units Subcutaneous QHS   melatonin  5 mg Oral QHS   metoprolol tartrate  50 mg Oral Daily    povidone-iodine  2 Application Topical Once   sodium bicarbonate  1,300 mg Oral QID   traZODone  50 mg Oral QHS   Continuous Infusions:  ceFEPime (MAXIPIME) IV 2 g (06/19/22 0933)   metronidazole 100 mL/hr at 06/19/22 0400   vancomycin 1,000 mg (06/19/22 1443)   PRN Meds: acetaminophen **OR** acetaminophen, HYDROcodone-acetaminophen, ondansetron **OR** ondansetron (ZOFRAN) IV, mouth rinse  Allergies:   No Known Allergies  Social History:   Social History   Socioeconomic History   Marital status: Married    Spouse name: Not on file   Number of children: Not on file   Years of education: Not on file   Highest education level: Not on file  Occupational History   Not on file  Tobacco Use   Smoking status: Never   Smokeless tobacco: Never  Vaping Use   Vaping Use: Never used  Substance and Sexual Activity   Alcohol use: No   Drug use: No  Sexual activity: Not on file  Other Topics Concern   Not on file  Social History Narrative   Not on file   Social Determinants of Health   Financial Resource Strain: Not on file  Food Insecurity: No Food Insecurity (06/18/2022)   Hunger Vital Sign    Worried About Running Out of Food in the Last Year: Never true    Ran Out of Food in the Last Year: Never true  Transportation Needs: No Transportation Needs (06/18/2022)   PRAPARE - Hydrologist (Medical): No    Lack of Transportation (Non-Medical): No  Physical Activity: Not on file  Stress: Not on file  Social Connections: Not on file  Intimate Partner Violence: Not At Risk (06/18/2022)   Humiliation, Afraid, Rape, and Kick questionnaire    Fear of Current or Ex-Partner: No    Emotionally Abused: No    Physically Abused: No    Sexually Abused: No    Family History:   Family History  Problem Relation Age of Onset   Diabetes Father    COPD Father    Heart disease Maternal Grandmother      ROS:  Please see the history of present illness.   Review of Systems  Constitutional:  Positive for chills. Negative for fever.  HENT:  Negative for congestion.   Respiratory:  Positive for cough, sputum production and shortness of breath.   Cardiovascular:  Positive for leg swelling. Negative for chest pain, palpitations, orthopnea and PND.  Gastrointestinal:  Positive for diarrhea (intermittent). Negative for blood in stool, melena, nausea and vomiting.  Genitourinary:  Negative for hematuria.  Musculoskeletal:  Positive for falls.  Neurological:  Negative for dizziness and loss of consciousness.  Endo/Heme/Allergies:  Does not bruise/bleed easily.  Psychiatric/Behavioral:  Negative for substance abuse.     All other ROS reviewed and negative.     Physical Exam/Data:   Vitals:   06/19/22 0842 06/19/22 0930 06/19/22 1000 06/19/22 1200  BP:   (!) 158/129 (!) 150/87  Pulse: 63  73 72  Resp: (!) '22  20 15  '$ Temp:    99.5 F (37.5 C)  TempSrc:    Oral  SpO2: 98% 91% (!) 88% (!) 87%  Weight:      Height:        Intake/Output Summary (Last 24 hours) at 06/19/2022 1534 Last data filed at 06/19/2022 0400 Gross per 24 hour  Intake 505.3 ml  Output 600 ml  Net -94.7 ml      06/17/2022   12:25 PM 01/21/2018   10:01 AM 12/21/2017    4:26 PM  Last 3 Weights  Weight (lbs) 250 lb 253 lb 254 lb 3.1 oz  Weight (kg) 113.399 kg 114.76 kg 115.3 kg     Body mass index is 42.91 kg/m.  General: 76 y.o. morbidly obese Caucasian female resting comfortably in no acute distress. On 15L of O2 via HFNC. HEENT: Normocephalic and atraumatic. Sclera clear.  Neck: Supple. JVD difficult to assess due to body habitus. Heart: Irregular rhythm with normal rate. Distinct S1 and S2. No murmurs, gallops, or rubs. Radial and distal pedal pulses 2+ and equal bilaterally. Lungs: No increased work of breathing. Bibasilar crackles and scattered expiratory wheezing. Abdomen: Soft, non-distended, and non-tender to palpation. Bowel sounds present. Extremities:  1+ pitting edema of bilateral lower extremities. S/p transmetatarsal amputation of the right foot. Ulcer on plantar surface of right foot. Skin: Warm and dry. Neuro: Alert and oriented x3. No  focal deficits. Psych: Normal affect. Responds appropriately.   EKG:  The EKG was personally reviewed and demonstrates:  Normal sinus rhythm, rate 89 bpm, with no acute ST/T changes.  Normal axis.  Normal PR and QRS intervals.  QTc 431 ms.  Telemetry:  Telemetry was personally reviewed and demonstrates:  Atrial fibrillation with rates in the 70s.  Relevant CV Studies:  Echocardiogram 06/18/2022: Impressions: 1. Left ventricular ejection fraction, by estimation, is 35 to 40%. The  left ventricle has moderately decreased function. The left ventricle  demonstrates regional wall motion abnormalities (see scoring  diagram/findings for description). There is mild  concentric left ventricular hypertrophy. Left ventricular diastolic  parameters are consistent with Grade I diastolic dysfunction (impaired  relaxation). Elevated left ventricular end-diastolic pressure.   2. Right ventricular systolic function is normal. The right ventricular  size is normal. There is mildly elevated pulmonary artery systolic  pressure.   3. Left atrial size was moderately dilated.   4. The mitral valve is normal in structure. Trivial mitral valve  regurgitation. No evidence of mitral stenosis.   5. The aortic valve is tricuspid. There is mild calcification of the  aortic valve. There is mild thickening of the aortic valve. Aortic valve  regurgitation is not visualized. Mild aortic valve stenosis. Aortic valve  area, by VTI measures 1.55 cm.  Aortic valve mean gradient measures 12.0 mmHg. Aortic valve Vmax measures  2.20 m/s.   6. The inferior vena cava is dilated in size with >50% respiratory  variability, suggesting right atrial pressure of 8 mmHg.    Laboratory Data:  High Sensitivity Troponin:  No results for  input(s): "TROPONINIHS" in the last 720 hours.   Chemistry Recent Labs  Lab 06/17/22 1314 06/18/22 0105 06/19/22 0314  NA 134* 134* 137  K 4.7 4.3 5.0  CL 105 104 103  CO2 16* 17* 14*  GLUCOSE 168* 134* 149*  BUN 54* 51* 59*  CREATININE 3.81* 3.62* 3.75*  CALCIUM 8.8* 8.6* 8.6*  GFRNONAA 12* 13* 12*  ANIONGAP 13 13 20*    Recent Labs  Lab 06/17/22 1314 06/18/22 0105  PROT 7.3 5.9*  ALBUMIN 3.2* 2.3*  AST 16 16  ALT 8 7  ALKPHOS 87 71  BILITOT 0.6 0.4   Lipids No results for input(s): "CHOL", "TRIG", "HDL", "LABVLDL", "LDLCALC", "CHOLHDL" in the last 168 hours.  Hematology Recent Labs  Lab 06/17/22 1314 06/18/22 0105 06/19/22 0350  WBC 20.9* 22.6* 18.6*  RBC 3.25* 2.98* 2.86*  HGB 9.9* 9.1* 8.8*  HCT 31.3* 29.2* 27.3*  MCV 96.3 98.0 95.5  MCH 30.5 30.5 30.8  MCHC 31.6 31.2 32.2  RDW 12.7 12.5 12.7  PLT 376 353 399   Thyroid No results for input(s): "TSH", "FREET4" in the last 168 hours.  BNP Recent Labs  Lab 06/17/22 1711  BNP 198.0*    DDimer  Recent Labs  Lab 06/17/22 1711  DDIMER 3.31*     Radiology/Studies:  DG Chest Port 1 View  Result Date: 06/19/2022 CLINICAL DATA:  Dyspnea EXAM: PORTABLE CHEST 1 VIEW COMPARISON:  06/17/2022 chest radiograph. FINDINGS: Stable cardiomediastinal silhouette with mild cardiomegaly. No pneumothorax. No pleural effusion. Diffuse hazy patchy parahilar lung opacities, most prominent in the upper lungs, worsened. IMPRESSION: Stable mild cardiomegaly. Worsened diffuse hazy patchy parahilar lung opacities, most prominent in the upper lungs, favor pulmonary edema. Electronically Signed   By: Ilona Sorrel M.D.   On: 06/19/2022 08:31   ECHOCARDIOGRAM COMPLETE  Result Date: 06/18/2022    ECHOCARDIOGRAM  REPORT   Patient Name:   Courtney Schneider Date of Exam: 06/18/2022 Medical Rec #:  [id number]          Height:       64.0 in Accession #:    [id number]         Weight:       250.0 lb Date of Birth:  1946-03-30         BSA:           2.151 m Patient Age:    72 years           BP:           139/62 mmHg Patient Gender: F                  HR:           75 bpm. Exam Location:  Inpatient Procedure: 2D Echo, Cardiac Doppler, Color Doppler and Intracardiac            Opacification Agent Indications:    CHF-Acute Diastolic J57.01  History:        Patient has no prior history of Echocardiogram examinations.                 CHF, Sepsis; Risk Factors:Hypertension, Dyslipidemia, Diabetes                 and Non-Smoker.  Sonographer:    Greer Pickerel Referring Phys: [id number] Orma Flaming  Sonographer Comments: Technically difficult study due to poor echo windows. Image acquisition challenging due to COPD and Image acquisition challenging due to patient body habitus. IMPRESSIONS  1. Left ventricular ejection fraction, by estimation, is 35 to 40%. The left ventricle has moderately decreased function. The left ventricle demonstrates regional wall motion abnormalities (see scoring diagram/findings for description). There is mild concentric left ventricular hypertrophy. Left ventricular diastolic parameters are consistent with Grade I diastolic dysfunction (impaired relaxation). Elevated left ventricular end-diastolic pressure.  2. Right ventricular systolic function is normal. The right ventricular size is normal. There is mildly elevated pulmonary artery systolic pressure.  3. Left atrial size was moderately dilated.  4. The mitral valve is normal in structure. Trivial mitral valve regurgitation. No evidence of mitral stenosis.  5. The aortic valve is tricuspid. There is mild calcification of the aortic valve. There is mild thickening of the aortic valve. Aortic valve regurgitation is not visualized. Mild aortic valve stenosis. Aortic valve area, by VTI measures 1.55 cm. Aortic valve mean gradient measures 12.0 mmHg. Aortic valve Vmax measures 2.20 m/s.  6. The inferior vena cava is dilated in size with >50% respiratory variability, suggesting right  atrial pressure of 8 mmHg. FINDINGS  Left Ventricle: Left ventricular ejection fraction, by estimation, is 35 to 40%. The left ventricle has moderately decreased function. The left ventricle demonstrates regional wall motion abnormalities. Definity contrast agent was given IV to delineate the left ventricular endocardial borders. The left ventricular internal cavity size was normal in size. There is mild concentric left ventricular hypertrophy. Left ventricular diastolic parameters are consistent with Grade I diastolic dysfunction (impaired relaxation). Elevated left ventricular end-diastolic pressure.  LV Wall Scoring: The mid and distal anterior wall, mid and distal anterior septum, and entire apex are hypokinetic. The antero-lateral wall, inferior wall, posterior wall, basal anteroseptal segment, mid inferoseptal segment, basal anterior segment, and basal inferoseptal segment are normal. Right Ventricle: The right ventricular size is normal. No increase in right ventricular wall thickness. Right ventricular systolic function is normal. There  is mildly elevated pulmonary artery systolic pressure. The tricuspid regurgitant velocity is 2.65  m/s, and with an assumed right atrial pressure of 8 mmHg, the estimated right ventricular systolic pressure is 01.7 mmHg. Left Atrium: Left atrial size was moderately dilated. Right Atrium: Right atrial size was normal in size. Pericardium: There is no evidence of pericardial effusion. Mitral Valve: The mitral valve is normal in structure. Trivial mitral valve regurgitation. No evidence of mitral valve stenosis. Tricuspid Valve: The tricuspid valve is normal in structure. Tricuspid valve regurgitation is trivial. No evidence of tricuspid stenosis. Aortic Valve: The aortic valve is tricuspid. There is mild calcification of the aortic valve. There is mild thickening of the aortic valve. Aortic valve regurgitation is not visualized. Mild aortic stenosis is present. Aortic valve  mean gradient measures  12.0 mmHg. Aortic valve peak gradient measures 19.4 mmHg. Aortic valve area, by VTI measures 1.55 cm. Pulmonic Valve: The pulmonic valve was normal in structure. Pulmonic valve regurgitation is not visualized. No evidence of pulmonic stenosis. Aorta: The aortic root is normal in size and structure. Venous: The inferior vena cava is dilated in size with greater than 50% respiratory variability, suggesting right atrial pressure of 8 mmHg. IAS/Shunts: No atrial level shunt detected by color flow Doppler.  LEFT VENTRICLE PLAX 2D LVIDd:         4.00 cm      Diastology LVIDs:         3.30 cm      LV e' medial:    6.09 cm/s LV PW:         1.20 cm      LV E/e' medial:  17.6 LV IVS:        1.10 cm      LV e' lateral:   3.92 cm/s LVOT diam:     1.90 cm      LV E/e' lateral: 27.3 LV SV:         76 LV SV Index:   35 LVOT Area:     2.84 cm  LV Volumes (MOD) LV vol d, MOD A2C: 146.0 ml LV vol d, MOD A4C: 162.0 ml LV vol s, MOD A2C: 41.4 ml LV vol s, MOD A4C: 110.0 ml LV SV MOD A2C:     104.6 ml LV SV MOD A4C:     162.0 ml LV SV MOD BP:      85.0 ml RIGHT VENTRICLE RV S prime:     9.79 cm/s TAPSE (M-mode): 2.0 cm LEFT ATRIUM           Index        RIGHT ATRIUM           Index LA diam:      3.90 cm 1.81 cm/m   RA Area:     19.90 cm LA Vol (A2C): 82.3 ml 38.26 ml/m  RA Volume:   61.10 ml  28.41 ml/m LA Vol (A4C): 42.6 ml 19.80 ml/m  AORTIC VALVE AV Area (Vmax):    1.53 cm AV Area (Vmean):   1.49 cm AV Area (VTI):     1.55 cm AV Vmax:           220.50 cm/s AV Vmean:          159.000 cm/s AV VTI:            0.489 m AV Peak Grad:      19.4 mmHg AV Mean Grad:      12.0 mmHg LVOT Vmax:  119.00 cm/s LVOT Vmean:        83.500 cm/s LVOT VTI:          0.267 m LVOT/AV VTI ratio: 0.55  AORTA Ao Root diam: 3.30 cm Ao Asc diam:  3.10 cm MITRAL VALVE                TRICUSPID VALVE MV Area (PHT): 3.21 cm     TR Peak grad:   28.1 mmHg MV Decel Time: 236 msec     TR Vmax:        265.00 cm/s MV E velocity:  107.00 cm/s                             SHUNTS                             Systemic VTI:  0.27 m                             Systemic Diam: 1.90 cm Skeet Latch MD Electronically signed by Skeet Latch MD Signature Date/Time: 06/18/2022/6:03:27 PM    Final    VAS [place] LOWER EXTREMITY VENOUS (DVT)  Result Date: 06/18/2022  Lower Venous DVT Study Patient Name:  Courtney Schneider  Date of Exam:   06/18/2022 Medical Rec #: [id number]           Accession #:    [id number] Date of Birth: [birth year]          Patient Gender: F Patient Age:   17 years Exam Location:  [facility] Procedure:      VAS [place] LOWER EXTREMITY VENOUS (DVT) Referring Phys: DAWOOD ELGERGAWY --------------------------------------------------------------------------------  Indications: Pain, and Edema.  Limitations: Body habitus, poor ultrasound/tissue interface and pain intolerance. Comparison Study: No previous exams Performing Technologist: Jody Hill RVT, RDMS  Examination Guidelines: A complete evaluation includes B-mode imaging, spectral Doppler, color Doppler, and power Doppler as needed of all accessible portions of each vessel. Bilateral testing is considered an integral part of a complete examination. Limited examinations for reoccurring indications may be performed as noted. The reflux portion of the exam is performed with the patient in reverse Trendelenburg.  +--------+---------------+---------+-----------+----------+--------------------+ RIGHT   CompressibilityPhasicitySpontaneityPropertiesThrombus Aging       +--------+---------------+---------+-----------+----------+--------------------+ CFV     Full           Yes      Yes                                       +--------+---------------+---------+-----------+----------+--------------------+ SFJ     Full                                                              +--------+---------------+---------+-----------+----------+--------------------+ FV Prox  Full           Yes      Yes                                       +--------+---------------+---------+-----------+----------+--------------------+  FV Mid  Full           Yes      Yes                                       +--------+---------------+---------+-----------+----------+--------------------+ FV                     Yes      Yes                  patent by            Distal                                               color/doppler        +--------+---------------+---------+-----------+----------+--------------------+ PFV     Full                                                              +--------+---------------+---------+-----------+----------+--------------------+ POP     Full                                                              +--------+---------------+---------+-----------+----------+--------------------+ PTV     Full                                         Not well visualized  +--------+---------------+---------+-----------+----------+--------------------+ PERO    Full                                         Not well visualized  +--------+---------------+---------+-----------+----------+--------------------+   +---------+---------------+---------+-----------+----------+-------------------+ LEFT     CompressibilityPhasicitySpontaneityPropertiesThrombus Aging      +---------+---------------+---------+-----------+----------+-------------------+ CFV      Full           Yes      Yes                                      +---------+---------------+---------+-----------+----------+-------------------+ SFJ      Full                                                             +---------+---------------+---------+-----------+----------+-------------------+ FV Prox  Full           Yes      Yes                                      +---------+---------------+---------+-----------+----------+-------------------+  FV Mid    Full           Yes      Yes                                      +---------+---------------+---------+-----------+----------+-------------------+ FV DistalFull           Yes      Yes                                      +---------+---------------+---------+-----------+----------+-------------------+ PFV      Full                                                             +---------+---------------+---------+-----------+----------+-------------------+ POP      Full           Yes      Yes                                      +---------+---------------+---------+-----------+----------+-------------------+ PTV      Full                                         Not well visualized +---------+---------------+---------+-----------+----------+-------------------+ PERO     Full                                         Not well visualized +---------+---------------+---------+-----------+----------+-------------------+     Summary: BILATERAL: - No evidence of deep vein thrombosis seen in the lower extremities in areas visualized, bilaterally. -No evidence of popliteal cyst, bilaterally.   *See table(s) above for measurements and observations. Electronically signed by Servando Snare MD on 06/18/2022 at 5:31:54 PM.    Final    NM Pulmonary Perfusion  Result Date: 06/18/2022 CLINICAL DATA:  76 year old female with shortness of breath. EXAM: NUCLEAR MEDICINE PERFUSION LUNG SCAN TECHNIQUE: Perfusion images were obtained in multiple projections after intravenous injection of radiopharmaceutical. Ventilation scans intentionally deferred if perfusion scan and chest x-ray adequate for interpretation during COVID 19 epidemic. RADIOPHARMACEUTICALS:  4.1 mCi Tc-54mMAA IV COMPARISON:  Portable chest x-ray yesterday 1647 hours. FINDINGS: Homogeneous perfusion radiotracer activity in both lungs when accounting for mediastinal - and on the lateral views upper extremity related photopenia. No  convincing perfusion defect. IMPRESSION: Normal perfusion, no evidence of pulmonary embolus. Electronically Signed   By: HGenevie AnnM.D.   On: 06/18/2022 10:16   MR FOOT RIGHT WO CONTRAST  Result Date: 06/18/2022 CLINICAL DATA:  Diabetic right foot ulcer. History of prior amputation. EXAM: MRI OF THE RIGHT FOREFOOT WITHOUT CONTRAST TECHNIQUE: Multiplanar, multisequence MR imaging of the right foot was performed. No intravenous contrast was administered. COMPARISON:  Right foot x-rays from same day. MRI right foot dated Dec 24, 2017. FINDINGS: Bones/Joint/Cartilage Prior transmetatarsal amputation. Abnormal marrow edema with early decreased T1 marrow signal involving the residual fourth and  fifth metatarsal bases. No fracture or dislocation. Joint spaces are preserved. No joint effusion. Ligaments Medial and lateral ankle ligaments are grossly intact. Muscles and Tendons No tenosynovitis. Soft tissue Lateral plantar midfoot soft tissue ulceration adjacent to the residual fifth metatarsal base with subcutaneous emphysema but no fluid collection. Diffuse soft tissue swelling. No soft tissue mass. IMPRESSION: 1. Lateral plantar midfoot soft tissue ulceration adjacent to the residual fifth metatarsal base with underlying osteomyelitis of the residual fourth and fifth metatarsal bases. No abscess. Electronically Signed   By: Titus Dubin M.D.   On: 06/18/2022 07:53   DG CHEST PORT 1 VIEW  Result Date: 06/17/2022 CLINICAL DATA:  Acute respiratory failure.  Hypoxia. EXAM: PORTABLE CHEST 1 VIEW COMPARISON:  June 17, 2022 FINDINGS: Stable cardiomegaly. The hila and mediastinum are normal. Bilateral hazy opacities are identified, more focal in the bases, right greater than left. No pneumothorax. No nodule or mass. IMPRESSION: 1. Cardiomegaly. 2. Bilateral hazy opacities, more focal in the bases, right greater than left. Findings could represent pulmonary edema or multifocal infection. Recommend clinical  correlation and follow-up to resolution. Electronically Signed   By: Dorise Bullion III M.D.   On: 06/17/2022 16:57   DG Foot 2 Views Right  Result Date: 06/17/2022 CLINICAL DATA:  Diabetic foot. EXAM: RIGHT FOOT - 2 VIEW COMPARISON:  None Available. FINDINGS: Post amputation midfoot level. Gas ulceration in the stomach. Osseous erosion. Potential skin ulceration posterior to the calcaneus. No subcutaneous gas identified. No osseous erosion. IMPRESSION: No osseous erosions to localize acute osteomyelitis. Electronically Signed   By: Suzy Bouchard M.D.   On: 06/17/2022 14:24   DG Chest 2 View  Result Date: 06/17/2022 CLINICAL DATA:  Suspected Sepsis EXAM: CHEST - 2 VIEW COMPARISON:  July 10, 2021 FINDINGS: The cardiomediastinal silhouette is unchanged in contour.Atherosclerotic calcifications. No pleural effusion. No pneumothorax. No acute pleuroparenchymal abnormality. Visualized abdomen is unremarkable. Multilevel degenerative changes of the thoracic spine. IMPRESSION: No acute cardiopulmonary abnormality. Electronically Signed   By: Valentino Saxon M.D.   On: 06/17/2022 12:51     Assessment and Plan:   Pre-Op Evaluation Patient presented with sepsis secondary to right foot ulcer and acute osteomyelitis.  Ortho has recommended right tibial below-knee amputation.  Cardiology has been consulted for preop evaluation. This is complicated by new onset acute CHF with development of pulmonary edema after receiving IV fluids in the ED. She also has new onset atrial fibrillation. Echo this admission showed LVEF of 35-40% with hypokinesis of the mid and distal anterior wall, mid and distal anterior septum, and entire apex (in distribution of LAD) as well as grade 1 diastolic dysfunction and mild aortic stenosis. She required BiPAP last night due to her respiratory status and is currently requiring 15 L of HFNC with O2 sat in the high 80s. She is high risk for any surgeries. Per Revised Cardiac Risk  Index, considered class IV risk with 15% 30 day risk for death, MI, or cardiac arrest given history of CHF, diabetes on insulin, and CKD. She is going to be high risk regardless but would recommend delaying surgery to better optimize her from a cardiac standpoint.  Given newly reduced EF with wall motion abnormalities, there is also concern that she has CAD. Would ideally perform cardiac catheterization but she has CKD stage IV with creatinine of 3.7 and is adamant that she does not want surgery. Therefore, would not proceed with LHC. However, we could do RHC due better assess volume status and help [place]  know whether current respiratory issues are primarily due to CHF or pneumonia. Patient is agreeable to this. Will plan on RHC tomorrow.  The patient understands that risks include but are not limited to stroke (1 in 1000), death (1 in 34), kidney failure [usually temporary] (1 in 500), bleeding (1 in 200), allergic reaction [possibly serious] (1 in 200), and agrees to proceed.     New Onset Acute Combined CHF BNP mildly elevated at 198. Chest x-ray showed no acute findings. Echo showed LVEF of 35-40% with hypokinesis of the mid and distal anterior wall, mid and distal anterior septum, and entire apex (in distribution of LAD) as well as grade 1 diastolic dysfunction and mild aortic stenosis. She is currently being diuresed with IV Lasix. However, still net positive 2.068 L. Renal function stable with diuresis. - She still has bibasilar crackles and lower extremity edema on exam. - Agree with additional dose of IV Lasix '60mg'$  today.  - Currently on Lopressor '50mg'$  daily which she takes at home. Will transition to Toprol-XL '50mg'$  daily. - Will also start Hydralazine '100mg'$  three times daily and Imdur '15mg'$  daily. Will start on very low doses to make sure we kidneys remain well perfused and we do not causing worsening renal function. - No ACEi/ARB/ARNI, MRA, or SGLT2 inhibitor due to renal function. - Continue to  monitor daily weights, strict I/Os, and renal function. - Given wall motion abnormalities, suspect this is ischemic cardiomyopathy. However, unfortunately unable to proceed with LHC given renal function and patient is adamant she would not want dialysis. Therefore, will treat medically.  New Onset Atrial Fibrillation Presented in sinus rhythm but went into atrial fibrillation last night.  - Rates controlled.  - Will check EKG while in atrial fibrillation. - Will transition from Lopressor to Toprol-XL '50mg'$  daily as above given reduced EF. - CHA2DS2-VASc = 5 (CHF, HTN, DM, female, age). Will start IV Heparin in anticipation for upcoming amputation. After surgery, would transition to Eliquis.  Hypertension BP elevated. - Will transition from Lopressor to Toprol-XL and add Hydralazine/Imdur as above.  Hyperlipidemia LDL 64 in 03/2022. - Currently on home Lipitor '10mg'$  daily. Given presumed CAD based on Echo and diabetes, will increase to '40mg'$  daily in favor of high-intensity statin.  Acute on CKD Stage IV Creatinine 3.81 on admission. Baseline around 2.4 to 2.6. - Creatinine stable at 3.75 today with diuresis. - Continue to monitor closely. - Patient is adamant that she would not want dialysis. If renal function worsens, may need to consult Nephrology (previously followed them as an outpatient but has not seen them in a couple of years).  Otherwise, per primary team: - Sepsis secondary to osteomyelitis - Type 2 diabetes mellitus    Risk Assessment/Risk Scores:    New York Heart Association (NYHA) Functional Class NYHA Class III  CHA2DS2-VASc Score = 5  This indicates a 7.2% annual risk of stroke. The patient's score is based upon: CHF History: 1 HTN History: 1 Diabetes History: 1 Stroke History: 0 Vascular Disease History: 0 Age Score: 1 Gender Score: 1     For questions or updates, please contact [place] Please consult [email or web address] for contact info under     Signed, Darreld Mclean, PA-C  06/19/2022 3:34 PM

## 2022-06-19 NOTE — Consult Note (Signed)
NAME:  Courtney Schneider, MRN:  [id number], DOB:  01/06/1946, LOS: 2 ADMISSION DATE:  06/17/2022, CONSULTATION DATE:  06/19/22 REFERRING MD:  TRH, CHIEF COMPLAINT:  SOB   History of Present Illness:  76 year old woman presented to outside hospital worsening swelling in lower extremities.  With hypoxemia.  Chest x-ray consistent with pulmonary edema on my review interpretation with perihilar fullness as well as nodular alveolar filling processes more prominent centrally and less prominent in the periphery.  Concern for sepsis.  Imaging revealed osteomyelitis of her right foot.  Labs notable for acute acute renal failure on CKD stage IV.  Given antibiotics.  Transferred here for further evaluation.  This morning met with patient.  Daughter at bedside.  States swelling is slightly better or mildly improved with initial Lasix dosing.  Breathing about the same.  Saturating 88 to 90% on 15 L salter.  There is okay with BiPAP and he does not like wearing it.  Fully alert and conversant.  Pertinent  Medical History  EF 35%, CKD stage IV, hypertension, hyperlipidemia  Significant Hospital Events: Including procedures, antibiotic start and stop dates in addition to other pertinent events   11/19 admitted, concern for foot infection 11/21 PCCM consulted with hypoxemia and abnormal chest imaging and multisystem organ failure  Interim History / Subjective:    Objective   Blood pressure (!) 150/87, pulse 72, temperature 99.5 F (37.5 C), temperature source Oral, resp. rate 15, height _0  (1.626 m), weight 113.4 kg, SpO2 (!) 87 %.    FiO2 (%):  [60 %] 60 %   Intake/Output Summary (Last 24 hours) at 06/19/2022 1731 Last data filed at 06/19/2022 1555 Gross per 24 hour  Intake 505.3 ml  Output 1100 ml  Net -594.7 ml   Filed Weights   06/17/22 1225  Weight: 113.4 kg    Examination: General: Chronically ill-appearing, lying in bed Eyes: EOMI, no icterus Neck: Supple, JVP 3 to 4 cm above  clavicle with head of bed 45 degrees Lungs: Clear, no work of breathing Cardiovascular: 1+ edema bilaterally, warm, irregular Abdomen: Nondistended, bowel sounds present MSK: No synovitis, no joint effusion Neuro: Sensation intact, moves all fours Psych: Normal mood, full affect  Resolved Hospital Problem list   N/a  Assessment & Plan:  Acute hypoxemic respiratory failure: Chest imaging most consistent with pulmonary edema.  Infection felt less likely.  Fits with cardiomyopathy.  Worsened in the setting of renal failure, suspect venous congestion.  JVP elevated on exam. -- Lasix currently 120 mg IV, assess response -- Not dialysis candidate, if does not start producing urine, grave prognosis  Cardiomyopathy: EF 45% on TTE. -- Appreciate cardiology assistance  Renal failure: Suspect venous congestion.  Elevated JVP and lower extremity swelling is present. -- High-dose Lasix as above  Goals of care: She clearly states she is a DNR.  Has no desire for life support.  Also states she would never want undergo dialysis.  Saw multiple family members including her husband go through this and she never wants to do this.  We discussed we do the best to make her as best we can with medications but we would stop short at life prolonging measures that required mechanical support.  She expressed understanding.  Daughter and palliative care were present for discussion.  Highly recommend attempt at optimization of respiratory and volume status as well as renal function prior to any surgical procedure.  I fear she will have a poor outcome with her acute on chronic issues  currently.  She may still have a poor outcome with her chronic issues but at least we could try to optimize her acute issues.  Would highly recommend local anesthesia/block as opposed to general anesthesia for any planned surgical procedure in the future with plan cardiac anesthesia to help with vasodilatory changes that come with local  anesthesia/nerve block.  Best Practice (right click and "Reselect all SmartList Selections" daily)   Per primary  Labs   CBC: Recent Labs  Lab 06/17/22 1314 06/18/22 0105 06/19/22 0350  WBC 20.9* 22.6* 18.6*  NEUTROABS 18.0*  --   --   HGB 9.9* 9.1* 8.8*  HCT 31.3* 29.2* 27.3*  MCV 96.3 98.0 95.5  PLT 376 353 379    Basic Metabolic Panel: Recent Labs  Lab 06/17/22 1314 06/18/22 0105 06/19/22 0314  NA 134* 134* 137  K 4.7 4.3 5.0  CL 105 104 103  CO2 16* 17* 14*  GLUCOSE 168* 134* 149*  BUN 54* 51* 59*  CREATININE 3.81* 3.62* 3.75*  CALCIUM 8.8* 8.6* 8.6*   GFR: Estimated Creatinine Clearance: 16.2 mL/min (A) (by C-G formula based on SCr of 3.75 mg/dL (H)). Recent Labs  Lab 06/17/22 1314 06/17/22 1448 06/18/22 0105 06/19/22 0350  WBC 20.9*  --  22.6* 18.6*  LATICACIDVEN 3.0* 1.6  --   --     Liver Function Tests: Recent Labs  Lab 06/17/22 1314 06/18/22 0105  AST 16 16  ALT 8 7  ALKPHOS 87 71  BILITOT 0.6 0.4  PROT 7.3 5.9*  ALBUMIN 3.2* 2.3*   No results for input(s): "LIPASE", "AMYLASE" in the last 168 hours. No results for input(s): "AMMONIA" in the last 168 hours.  ABG No results found for: "PHART", "PCO2ART", "PO2ART", "HCO3", "TCO2", "ACIDBASEDEF", "O2SAT"   Coagulation Profile: Recent Labs  Lab 06/17/22 1314  INR 1.2    Cardiac Enzymes: No results for input(s): "CKTOTAL", "CKMB", "CKMBINDEX", "TROPONINI" in the last 168 hours.  HbA1C: Hgb A1c MFr Bld  Date/Time Value Ref Range Status  12/21/2017 11:59 AM 6.3 (H) 4.8 - 5.6 % Final    Comment:    (NOTE) Pre diabetes:          5.7%-6.4% Diabetes:              >6.4% Glycemic control for   <7.0% adults with diabetes   04/28/2009 04:50 AM (H) 4.6 - 6.1 % Final   13.4 (NOTE) The ADA recommends the following therapeutic goal for glycemic control related to Hgb A1c measurement: Goal of therapy: <6.5 Hgb A1c  Reference: American Diabetes Association: Clinical Practice Recommendations  2010, Diabetes Care, 2010, 33: (Suppl  1).    CBG: Recent Labs  Lab 06/18/22 2030 06/19/22 0626 06/19/22 0815 06/19/22 1224 06/19/22 1614  GLUCAP 148* 134* 133* 117* 145*    Review of Systems:   No chest pain on exertion.  Comprehensive review of systems otherwise negative.  Past Medical History:  She,  has a past medical history of Diabetes mellitus without complication ([place]), Hypertension, and Renal disorder.   Surgical History:   Past Surgical History:  Procedure Laterality Date   AMPUTATION TOE     CHOLECYSTECTOMY       Social History:   reports that she has never smoked. She has never used smokeless tobacco. She reports that she does not drink alcohol and does not use drugs.   Family History:  Her family history includes COPD in her father; Diabetes in her father; Heart disease in her maternal grandmother.  Allergies No Known Allergies   Home Medications  Prior to Admission medications   Medication Sig Start Date End Date Taking? Authorizing Provider  acetaminophen (TYLENOL) 500 MG tablet Take 1,000 mg by mouth every 6 (six) hours as needed for moderate pain.   Yes [provider]  albuterol (VENTOLIN HFA) 108 (90 Base) MCG/ACT inhaler Inhale into the lungs. 11/10/21  Yes [provider]  amLODipine-valsartan (EXFORGE) 10-160 MG tablet Take 1 tablet by mouth daily. Resume on 5/31 12/27/17  Yes Barton Dubois, MD  aspirin EC 81 MG tablet Take 81 mg by mouth daily.   Yes [provider]  atorvastatin (LIPITOR) 10 MG tablet Take 10 mg by mouth daily.   Yes [provider]  cholecalciferol (VITAMIN D3) 25 MCG (1000 UNIT) tablet Take by mouth.   Yes [provider]  diphenhydramine-acetaminophen (TYLENOL PM) 25-500 MG TABS tablet Take 1 tablet by mouth at bedtime as needed (sleep).   Yes [provider]  furosemide (LASIX) 40 MG tablet Take 40 mg by mouth.   Yes [provider]  insulin glargine (LANTUS)  100 UNIT/ML injection Inject 30 Units into the skin at bedtime.   Yes [provider]  metoprolol (LOPRESSOR) 50 MG tablet Take 50 mg by mouth daily.   Yes [provider]  Multiple Vitamins-Minerals (PRESERVISION AREDS PO) Take 1 tablet by mouth daily.   Yes [provider]  sodium bicarbonate 650 MG tablet Take 1 tablet by mouth daily. 04/10/19  Yes [provider]     Critical care time:     Lanier Clam, MD See Shea Evans for contact info

## 2022-06-20 ENCOUNTER — Other Ambulatory Visit (HOSPITAL_COMMUNITY): Payer: Self-pay

## 2022-06-20 ENCOUNTER — Encounter (HOSPITAL_COMMUNITY): Payer: Self-pay | Admitting: Interventional Cardiology

## 2022-06-20 ENCOUNTER — Inpatient Hospital Stay (HOSPITAL_COMMUNITY)
Admission: EM | Disposition: A | Discharge status: Hospice | Payer: Self-pay | Source: Home / Self Care | Attending: Internal Medicine

## 2022-06-20 ENCOUNTER — Encounter (HOSPITAL_COMMUNITY)
Admission: EM | Disposition: A | Discharge status: Hospice | Payer: Self-pay | Source: Home / Self Care | Attending: Internal Medicine

## 2022-06-20 DIAGNOSIS — J9601 Acute respiratory failure with hypoxia: Secondary | ICD-10-CM | POA: Diagnosis not present

## 2022-06-20 DIAGNOSIS — I5043 Acute on chronic combined systolic (congestive) and diastolic (congestive) heart failure: Secondary | ICD-10-CM | POA: Diagnosis not present

## 2022-06-20 DIAGNOSIS — I480 Paroxysmal atrial fibrillation: Secondary | ICD-10-CM

## 2022-06-20 DIAGNOSIS — A419 Sepsis, unspecified organism: Secondary | ICD-10-CM | POA: Diagnosis not present

## 2022-06-20 DIAGNOSIS — I10 Essential (primary) hypertension: Secondary | ICD-10-CM

## 2022-06-20 DIAGNOSIS — I5023 Acute on chronic systolic (congestive) heart failure: Secondary | ICD-10-CM

## 2022-06-20 DIAGNOSIS — I5032 Chronic diastolic (congestive) heart failure: Secondary | ICD-10-CM

## 2022-06-20 DIAGNOSIS — M869 Osteomyelitis, unspecified: Secondary | ICD-10-CM | POA: Diagnosis not present

## 2022-06-20 DIAGNOSIS — Z515 Encounter for palliative care: Secondary | ICD-10-CM | POA: Diagnosis not present

## 2022-06-20 DIAGNOSIS — N179 Acute kidney failure, unspecified: Secondary | ICD-10-CM | POA: Diagnosis not present

## 2022-06-20 DIAGNOSIS — Z7189 Other specified counseling: Secondary | ICD-10-CM | POA: Diagnosis not present

## 2022-06-20 HISTORY — PX: RIGHT HEART CATH: CATH118263

## 2022-06-20 LAB — GLUCOSE, CAPILLARY
Glucose-Capillary: 103 mg/dL — ABNORMAL HIGH (ref 70–99)
Glucose-Capillary: 120 mg/dL — ABNORMAL HIGH (ref 70–99)
Glucose-Capillary: 142 mg/dL — ABNORMAL HIGH (ref 70–99)
Glucose-Capillary: 143 mg/dL — ABNORMAL HIGH (ref 70–99)

## 2022-06-20 LAB — BASIC METABOLIC PANEL
Anion gap: 14 (ref 5–15)
BUN: 67 mg/dL — ABNORMAL HIGH (ref 8–23)
CO2: 20 mmol/L — ABNORMAL LOW (ref 22–32)
Calcium: 8.5 mg/dL — ABNORMAL LOW (ref 8.9–10.3)
Chloride: 105 mmol/L (ref 98–111)
Creatinine, Ser: 4.13 mg/dL — ABNORMAL HIGH (ref 0.44–1.00)
GFR, Estimated: 11 mL/min — ABNORMAL LOW (ref >60)
Glucose, Bld: 111 mg/dL — ABNORMAL HIGH (ref 70–99)
Potassium: 4.6 mmol/L (ref 3.5–5.1)
Sodium: 139 mmol/L (ref 135–145)

## 2022-06-20 LAB — URINE CULTURE: Culture: 10000 — AB

## 2022-06-20 LAB — POCT I-STAT EG7
Acid-base deficit: 5 mmol/L — ABNORMAL HIGH (ref 0.0–2.0)
Acid-base deficit: 6 mmol/L — ABNORMAL HIGH (ref 0.0–2.0)
Bicarbonate: 19.4 mmol/L — ABNORMAL LOW (ref 20.0–28.0)
Bicarbonate: 19.4 mmol/L — ABNORMAL LOW (ref 20.0–28.0)
Calcium, Ion: 1.18 mmol/L (ref 1.15–1.40)
Calcium, Ion: 1.15 mmol/L (ref 1.15–1.40)
HCT: 26 % — ABNORMAL LOW (ref 36.0–46.0)
HCT: 25 % — ABNORMAL LOW (ref 36.0–46.0)
Hemoglobin: 8.8 g/dL — ABNORMAL LOW (ref 12.0–15.0)
Hemoglobin: 8.5 g/dL — ABNORMAL LOW (ref 12.0–15.0)
O2 Saturation: 55 %
O2 Saturation: 52 %
Potassium: 4.4 mmol/L (ref 3.5–5.1)
Potassium: 4.3 mmol/L (ref 3.5–5.1)
Sodium: 138 mmol/L (ref 135–145)
Sodium: 139 mmol/L (ref 135–145)
TCO2: 20 mmol/L — ABNORMAL LOW (ref 22–32)
TCO2: 20 mmol/L — ABNORMAL LOW (ref 22–32)
pCO2, Ven: 34.5 mmHg — ABNORMAL LOW (ref 44–60)
pCO2, Ven: 35 mmHg — ABNORMAL LOW (ref 44–60)
pH, Ven: 7.359 (ref 7.25–7.43)
pH, Ven: 7.352 (ref 7.25–7.43)
pO2, Ven: 30 mmHg — CL (ref 32–45)
pO2, Ven: 29 mmHg — CL (ref 32–45)

## 2022-06-20 LAB — CBC
HCT: 25.1 % — ABNORMAL LOW (ref 36.0–46.0)
Hemoglobin: 8.1 g/dL — ABNORMAL LOW (ref 12.0–15.0)
MCH: 31 pg (ref 26.0–34.0)
MCHC: 32.3 g/dL (ref 30.0–36.0)
MCV: 96.2 fL (ref 80.0–100.0)
Platelets: 369 10*3/uL (ref 150–400)
RBC: 2.61 MIL/uL — ABNORMAL LOW (ref 3.87–5.11)
RDW: 12.8 % (ref 11.5–15.5)
WBC: 14.4 10*3/uL — ABNORMAL HIGH (ref 4.0–10.5)
nRBC: 0 % (ref 0.0–0.2)

## 2022-06-20 LAB — HEPARIN LEVEL (UNFRACTIONATED)
Heparin Unfractionated: 0.43 IU/mL (ref 0.30–0.70)
Heparin Unfractionated: 0.28 IU/mL — ABNORMAL LOW (ref 0.30–0.70)

## 2022-06-20 SURGERY — AMPUTATION BELOW KNEE
Anesthesia: Choice | Site: Knee | Laterality: Right

## 2022-06-20 SURGERY — RIGHT HEART CATH

## 2022-06-20 MED ORDER — HEPARIN (PORCINE) IN NACL 1000-0.9 UT/500ML-% IV SOLN
INTRAVENOUS | Status: AC
  Filled 2022-06-20: qty 500

## 2022-06-20 MED ORDER — HYDRALAZINE HCL 20 MG/ML IJ SOLN: 10.0000 mg | INTRAMUSCULAR | Status: AC | PRN

## 2022-06-20 MED ORDER — HEPARIN (PORCINE) IN NACL 1000-0.9 UT/500ML-% IV SOLN
INTRAVENOUS | Status: DC | PRN
  Administered 2022-06-20 (×2): 500 mL

## 2022-06-20 MED ORDER — VERAPAMIL HCL 2.5 MG/ML IV SOLN
INTRAVENOUS | Status: AC
  Filled 2022-06-20: qty 2

## 2022-06-20 MED ORDER — ONDANSETRON HCL 4 MG/2ML IJ SOLN: 4.0000 mg | Freq: Four times a day (QID) | INTRAMUSCULAR | Status: DC | PRN

## 2022-06-20 MED ORDER — LABETALOL HCL 5 MG/ML IV SOLN: 10.0000 mg | INTRAVENOUS | Status: AC | PRN

## 2022-06-20 MED ORDER — SODIUM CHLORIDE 0.9 % IV SOLN: 250.0000 mL | INTRAVENOUS | Status: DC | PRN

## 2022-06-20 MED ORDER — ACETAMINOPHEN 325 MG PO TABS
650.0000 mg | ORAL_TABLET | ORAL | Status: DC | PRN
  Administered 2022-06-20: 650 mg via ORAL
  Filled 2022-06-20: qty 2

## 2022-06-20 MED ORDER — HEPARIN (PORCINE) 25000 UT/250ML-% IV SOLN
1650.0000 [IU]/h | INTRAVENOUS | Status: DC
  Filled 2022-06-20: qty 250

## 2022-06-20 MED ORDER — FUROSEMIDE 10 MG/ML IJ SOLN
120.0000 mg | Freq: Two times a day (BID) | INTRAVENOUS | Status: DC
  Administered 2022-06-20 – 2022-06-22 (×4): 120 mg via INTRAVENOUS
  Filled 2022-06-20: qty 10
  Filled 2022-06-20: qty 12
  Filled 2022-06-20: qty 2
  Filled 2022-06-20: qty 10
  Filled 2022-06-20: qty 12
  Filled 2022-06-20: qty 2

## 2022-06-20 MED ORDER — CEFAZOLIN SODIUM-DEXTROSE 2-4 GM/100ML-% IV SOLN
2.0000 g | INTRAVENOUS | Status: DC
  Filled 2022-06-20: qty 100

## 2022-06-20 MED ORDER — LIDOCAINE HCL (PF) 1 % IJ SOLN
INTRAMUSCULAR | Status: DC | PRN
  Administered 2022-06-20: 2 mL

## 2022-06-20 MED ORDER — SODIUM CHLORIDE 0.9 % IV SOLN: INTRAVENOUS | Status: DC

## 2022-06-20 MED ORDER — SODIUM CHLORIDE 0.9% FLUSH: 3.0000 mL | INTRAVENOUS | Status: DC | PRN

## 2022-06-20 MED ORDER — SODIUM CHLORIDE 0.9% FLUSH
3.0000 mL | Freq: Two times a day (BID) | INTRAVENOUS | Status: DC
  Administered 2022-06-20 – 2022-06-21 (×3): 3 mL via INTRAVENOUS

## 2022-06-20 MED ORDER — LIDOCAINE HCL (PF) 1 % IJ SOLN
INTRAMUSCULAR | Status: AC
  Filled 2022-06-20: qty 30

## 2022-06-20 MED ORDER — FUROSEMIDE 10 MG/ML IJ SOLN
120.0000 mg | Freq: Once | INTRAVENOUS | Status: AC
  Administered 2022-06-20: 120 mg via INTRAVENOUS
  Filled 2022-06-20: qty 12

## 2022-06-20 SURGICAL SUPPLY — 6 items
CATH BALLN WEDGE 5F 110CM (CATHETERS) IMPLANT
ELECT DEFIB PAD ADLT CADENCE (PAD) IMPLANT
GUIDEWIRE .025 260CM (WIRE) IMPLANT
PACK CARDIAC CATHETERIZATION (CUSTOM PROCEDURE TRAY) IMPLANT
SHEATH GLIDE SLENDER 4/5FR (SHEATH) IMPLANT
SHEATH PROBE COVER 6X72 (BAG) IMPLANT

## 2022-06-20 NOTE — CV Procedure (Addendum)
Mean pulmonary artery pressure 30 mmHg consistent with mild pulmonary hypertension with phasic 62/15 mmHg.  Mean pulmonary wedge pressure 24 mmHg.  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 V wave in capillary wedge position averaging 40 mmHg. Mixed venous O2 saturation 53%. PAPI 4.36 consistent with normal RV function. Cardiac output 6.5 L/min with index 3.02 L/min/m.  Pulse oximetry arterial O2 saturation 93% with patient on 3 L/min by cannula.  Findings are consistent left heart failure with pulmonary capillary wedge mean pressure 24 mmHg.  Normal cardiac output.  Mild Pulmonary hypertension  Mild elevation in right atrial pressure with normal right heart function based on PAPI.

## 2022-06-20 NOTE — Interval H&P Note (Signed)
Cath Lab Visit (complete for each Cath Lab visit)  Clinical Evaluation Leading to the Procedure:   ACS: No.  Non-ACS:    Anginal Classification: No Symptoms  Anti-ischemic medical therapy: No Therapy  Non-Invasive Test Results: No non-invasive testing performed  Prior CABG: No previous CABG      History and Physical Interval Note:  06/20/2022 8:03 AM  Courtney Schneider  has presented today for surgery, with the diagnosis of heart failure.  The various methods of treatment have been discussed with the patient and family. After consideration of risks, benefits and other options for treatment, the patient has consented to  Procedure(s): RIGHT HEART CATH (N/A) as a surgical intervention.  The patient's history has been reviewed, patient examined, no change in status, stable for surgery.  I have reviewed the patient's chart and labs.  Questions were answered to the patient's satisfaction.     [name]

## 2022-06-20 NOTE — Progress Notes (Addendum)
Critical care attending attestation note:  Patient seen and examined and relevant ancillary tests reviewed.  I agree with the assessment and plan of care as outlined by Asa Saunas, NP.   76 y.o. admitted with osteomyelitis found to have renal failure and volume overload and hypoxemic respiratory failure.  Improvement symptoms of hypoxemia with increased dose of Lasix that I ordered yesterday.  EF is depressed.  Went for right heart cath today which shows elevated pulmonary capillary wedge pressure, pulmonary hypertension, low PVR indicative of group 2 disease.  Synopsis of assessment and plan:  Acute hypoxemic respiratory failure: Appears bilaterally on exam, edema on chest x-ray.  BNP is up.  Elevated wedge pressure on right heart cath. -- Aggressive diuresis per cardiology  Renal failure: Suspect venous congestion, decreased renal perfusion with efferent arterial hypertension. -- Continue diuresis, suspect will see some mild improvement in kidney function once venous congestion offloaded  PCCM will sign off  CRITICAL CARE N/a  Lanier Clam, MD ICU Physician [place] for contact info  06/20/2022, 1:58 PM      NAME:  Courtney Schneider, MRN:  [id number], DOB:  Oct 23, 1945, LOS: 2 ADMISSION DATE:  06/17/2022, CONSULTATION DATE:  06/19/22 REFERRING MD:  TRH, CHIEF COMPLAINT:  SOB   History of Present Illness:  76 year old woman presented to outside hospital worsening swelling in lower extremities.  With hypoxemia.  Chest x-ray consistent with pulmonary edema on my review interpretation with perihilar fullness as well as nodular alveolar filling processes more prominent centrally and less prominent in the periphery.  Concern for sepsis.  Imaging revealed osteomyelitis of her right foot.  Labs notable for acute acute renal failure on CKD stage IV.  Given antibiotics.  Transferred here for further evaluation.  This morning met with patient.   Daughter at bedside.  States swelling is slightly better or mildly improved with initial Lasix dosing.  Breathing about the same.  Saturating 88 to 90% on 15 L salter.  There is okay with BiPAP and he does not like wearing it.  Fully alert and conversant.  Pertinent  Medical History  EF 35%, CKD stage IV, hypertension, hyperlipidemia  Significant Hospital Events: Including procedures, antibiotic start and stop dates in addition to other pertinent events   11/19 admitted, concern for foot infection 11/21 PCCM consulted with hypoxemia and abnormal chest imaging and multisystem organ failure 06/20/2022 plan for right heart cath  Interim History / Subjective:    Objective   Blood pressure (!) 150/87, pulse 72, temperature 99.5 F (37.5 C), temperature source Oral, resp. rate 15, height _0  (1.626 m), weight 113.4 kg, SpO2 (!) 87 %.    FiO2 (%):  [60 %] 60 %   Intake/Output Summary (Last 24 hours) at 06/19/2022 1731 Last data filed at 06/19/2022 1555 Gross per 24 hour  Intake 505.3 ml  Output 1100 ml  Net -594.7 ml   Filed Weights   06/17/22 1225  Weight: 113.4 kg    Examination: General: Morbidly obese female who appears to be better than yesterday HEENT: MM pink/moist supple JVD Neuro: Grossly intact without focal defects CV: Heart sounds are distant PULM: Diminished breath sounds did not wear BiPAP during the night remains on high flow nasal cannula  GI: soft, bsx4 active  GU: Voids Extremities: warm/dry, 2+ edema right foot with old foot wound transmetatarsal amputation large ulcer left foot great toe with vascular insufficiency Skin: no rashes or lesions   Resolved Hospital Problem list  N/a  Assessment & Plan:  Acute hypoxemic respiratory failure: Chest imaging most consistent with pulmonary edema.  Infection felt less likely.  Fits with cardiomyopathy.  Worsened in the setting of renal failure, suspect venous congestion.  JVP elevated on exam. Respiratory status  improved with Lasix and negative INO\ Intake/Output Summary (Last 24 hours) at 06/20/2022 8032 Last data filed at 06/20/2022 0600 Gross per 24 hour  Intake 290.71 ml  Output 1050 ml  Net -759.29 ml  Scheduled for right heart cath 06/20/2022 With the information provided by right heart cath may or may not proceed with amputation of right Cardiology following   Renal failure: Suspect venous congestion.  Elevated JVP and lower extremity swelling is present. Lab Results  Component Value Date   CREATININE 4.13 (H) 06/20/2022   CREATININE 3.75 (H) 06/19/2022   CREATININE 3.62 (H) 06/18/2022   Diuresis as tolerated Note elevated creatinine  She is a DNR at this time.  But her daughter who is a Equities trader states that if needed she can be intubated in the Cath Lab and or surgery. Pulmonary critical care reviewed available as needed. Best Practice (right click and "Reselect all SmartList Selections" daily)   Per primary  Labs   CBC: Recent Labs  Lab 06/17/22 1314 06/18/22 0105 06/19/22 0350  WBC 20.9* 22.6* 18.6*  NEUTROABS 18.0*  --   --   HGB 9.9* 9.1* 8.8*  HCT 31.3* 29.2* 27.3*  MCV 96.3 98.0 95.5  PLT 376 353 122    Basic Metabolic Panel: Recent Labs  Lab 06/17/22 1314 06/18/22 0105 06/19/22 0314  NA 134* 134* 137  K 4.7 4.3 5.0  CL 105 104 103  CO2 16* 17* 14*  GLUCOSE 168* 134* 149*  BUN 54* 51* 59*  CREATININE 3.81* 3.62* 3.75*  CALCIUM 8.8* 8.6* 8.6*   GFR: Estimated Creatinine Clearance: 16.2 mL/min (A) (by C-G formula based on SCr of 3.75 mg/dL (H)). Recent Labs  Lab 06/17/22 1314 06/17/22 1448 06/18/22 0105 06/19/22 0350  WBC 20.9*  --  22.6* 18.6*  LATICACIDVEN 3.0* 1.6  --   --     Liver Function Tests: Recent Labs  Lab 06/17/22 1314 06/18/22 0105  AST 16 16  ALT 8 7  ALKPHOS 87 71  BILITOT 0.6 0.4  PROT 7.3 5.9*  ALBUMIN 3.2* 2.3*   No results for input(s): "LIPASE", "AMYLASE" in the last 168 hours. No results for input(s):  "AMMONIA" in the last 168 hours.  ABG No results found for: "PHART", "PCO2ART", "PO2ART", "HCO3", "TCO2", "ACIDBASEDEF", "O2SAT"   Coagulation Profile: Recent Labs  Lab 06/17/22 1314  INR 1.2    Cardiac Enzymes: No results for input(s): "CKTOTAL", "CKMB", "CKMBINDEX", "TROPONINI" in the last 168 hours.  HbA1C: Hgb A1c MFr Bld  Date/Time Value Ref Range Status  12/21/2017 11:59 AM 6.3 (H) 4.8 - 5.6 % Final    Comment:    (NOTE) Pre diabetes:          5.7%-6.4% Diabetes:              >6.4% Glycemic control for   <7.0% adults with diabetes   04/28/2009 04:50 AM (H) 4.6 - 6.1 % Final   13.4 (NOTE) The ADA recommends the following therapeutic goal for glycemic control related to Hgb A1c measurement: Goal of therapy: <6.5 Hgb A1c  Reference: American Diabetes Association: Clinical Practice Recommendations 2010, Diabetes Care, 2010, 33: (Suppl  1).    CBG: Recent Labs  Lab 06/18/22 2030 06/19/22 0626 06/19/22  0815 06/19/22 1224 06/19/22 1614  [place] ACNP Acute Care Nurse Practitioner [place] Please consult Amion 06/20/2022, 9:27 AM

## 2022-06-20 NOTE — Progress Notes (Signed)
ANTICOAGULATION CONSULT NOTE - Follow Up  Pharmacy Consult for Heparin Indication: afib  No Known Allergies  Patient Measurements: Height: '5\' 4"'$  (162.6 cm) Weight: 113.4 kg (250 lb) IBW/kg (Calculated) : 54.7 Heparin dosing wt: 82 kg   Vital Signs: Temp: 98.4 F (36.9 C) (11/22 1536) Temp Source: Oral (11/22 1536) BP: 146/56 (11/22 1536) Pulse Rate: 66 (11/22 1536)  Labs: Recent Labs    06/18/22 0105 06/18/22 1056 06/19/22 0314 06/19/22 0350 06/20/22 0546 06/20/22 1123 06/20/22 2025  HGB 9.1*  --   --  8.8* 8.1* 8.5*  8.8*  --   HCT 29.2*  --   --  27.3* 25.1* 25.0*  26.0*  --   PLT 353  --   --  399 369  --   --   HEPARINUNFRC 0.26* 0.57  --   --  0.43  --  0.28*  CREATININE 3.62*  --  3.75*  --  4.13*  --   --      Estimated Creatinine Clearance: 14.8 mL/min (A) (by C-G formula based on SCr of 4.13 mg/dL (H)).   Assessment: 76 y/o F initially on heparin for rule out PE, with negative VQ scan.  Heparin was changed to subq for VTE prophylaxis.  Patient then evaluated by cards for peri-operative risk assessment.  Cards recommending treatment dose heparin continue for afib with plans for cath and possible BKA. CHA2DS2-VASc of 5. Ultimate plans for oral anticoagulation when procedures completed.  Heparin level remains slightly subtherapeutic (0.28) on infusino at 1500 units/hr. No issues with line or bleeding reported per RN.  Goal of Therapy:  Heparin level 0.3-0.7 units/ml Monitor platelets by anticoagulation protocol: Yes   Plan:  Increase heparin to 1650 units/hr Will f/u 8hr heparin level  Sherlon Handing, PharmD, BCPS Please see amion for complete clinical pharmacist phone list 06/20/2022 9:19 PM

## 2022-06-20 NOTE — TOC Benefit Eligibility Note (Signed)
Patient Teacher, English as a foreign language completed.    The patient is currently admitted and upon discharge could be taking Eliquis.  The current 30 day co-pay is $30.00.   The patient is insured through [employer]     Patient Advocate [employer] verification completed.    The patient is currently admitted and upon discharge could be taking Xarelto.  The current 30 day co-pay is $30.00.   The patient is insured through [employer]     Patient Advocate [employer] verification completed.    The patient is currently admitted and upon discharge could be taking Warfarin.  The current 30 day co-pay is $3.00.   The patient is insured through [employer]

## 2022-06-20 NOTE — Progress Notes (Signed)
Palliative:  HPI: 76 y.o. female  with past medical history of CHF, s/p [employer] PPM/ICD, CAD, IDDM, CKD stage 3b, prostatomegaly admitted on 12/27/2021 with weakness x 3 days after fall followed with pain, difficulty to ambulate, decrease intake and urine output. Noted to have hypotension, leukocytosis related to discitis aspiration showing +Klebsiella along with UTI. Hospitalization complicated by cardiogenic shock EF <20%, grade 2 diastolic dysfunction, moderate pulmonary hypertension, renal failure, bladder outlet obstruction, paroxysmal atrial fibrillation. Ongoing significant fluid overload and poor response to diuresis. Trial large dose of Lasix while on dobutamine.   I met again today with Courtney Schneider along with his daughter and wife at bedside. Courtney Schneider continues to speak to his poor prognosis and is able to tell his family that he is very much at peace. He reviews that his vital signs have been good but he understands that his heart is "give out" - he says that his heart has served him well for many good years. He is a very spiritual man and reports that he had wonderful visits with chaplain Ellen. He is at peace and is prepared for transition to comfort care when his other daughter returns from [place]. Family at bedside report that they believed she would be back Thurs/Fri but she will not return until Monday July 3rd. Courtney Schneider was disheartened that she does not return until Monday but they all continue to report their goal to continue to maintain until she returns and then we will transition to full comfort care and allow natural and peaceful death.   I spoke with them again about my recommendation for deactivation of AICD and they all agree that this would be in his best interest at this time. I explained the process and will place order for [employer] to come to deactivate.   All questions/concerns addressed. Emotional support provided.   Exam: Alert, oriented. Frail.  Lying in bed. No distress. Generalized weakness and fatigue. HR 90-100s. Breathing regular, unlabored at rest. Abd soft. BLE edema.   Plan: - DNR - Deactivate AICD - Awaiting daughter's return from vacation to say goodbye and then will transition to full comfort care (she should be back Monday)  40 min  Oneta Sigman, NP Palliative Medicine Team Pager [phone] (Please see [email or web address] for schedule) Team Phone [phone]    Greater than 50%  of this time was spent counseling and coordinating care related to the above assessment and plan   

## 2022-06-20 NOTE — Care Management Important Message (Signed)
Important Message  Patient Details  Name: Courtney Schneider MRN: [id number] Date of Birth: 26-Aug-1945   Medicare Important Message Given:  Yes     Elvia Aydin Montine Circle 06/20/2022, 3:39 PM

## 2022-06-20 NOTE — Progress Notes (Signed)
Rounding Note    Patient Name: Courtney Schneider Date of Encounter: 06/20/2022  [place] Cardiologist: None   Subjective   Patient resting comfortably in bed, in no acute distress. Reports a noticeable improvement in her breathing today. No longer requiring HFNC. She denies chest pain, palpitations/sensation of an irregular heartbeat. Discussed plans for RHC today.   Inpatient Medications    Scheduled Meds:  atorvastatin  40 mg Oral Daily   chlorhexidine  60 mL Topical Once   Chlorhexidine Gluconate Cloth  6 each Topical Q0600   hydrALAZINE  10 mg Oral Q8H   insulin aspart  0-9 Units Subcutaneous TID WC   insulin glargine-yfgn  10 Units Subcutaneous QHS   isosorbide mononitrate  15 mg Oral Daily   melatonin  5 mg Oral QHS   metoprolol succinate  50 mg Oral Daily   mupirocin ointment  1 Application Nasal BID   povidone-iodine  2 Application Topical Once   sodium bicarbonate  1,300 mg Oral QID   sodium chloride flush  3 mL Intravenous Q12H   traZODone  50 mg Oral QHS   Continuous Infusions:  sodium chloride     sodium chloride 10 mL/hr at 06/20/22 0543    ceFAZolin (ANCEF) IV     ceFEPime (MAXIPIME) IV 2 g (06/19/22 0933)   furosemide 120 mg (06/20/22 0815)   heparin 1,500 Units/hr (06/19/22 1714)   metronidazole 500 mg (06/20/22 0447)   vancomycin 1,000 mg (06/19/22 1443)   PRN Meds: sodium chloride, acetaminophen **OR** acetaminophen, HYDROcodone-acetaminophen, ondansetron **OR** ondansetron (ZOFRAN) IV, mouth rinse, sodium chloride flush   Vital Signs    Vitals:   06/19/22 2200 06/19/22 2313 06/20/22 0337 06/20/22 0800  BP: (!) 133/46 132/70 (!) 127/41 (!) 146/51  Pulse: 64 64 63 68  Resp: '20 20 20 20  '$ Temp:  99.4 F (37.4 C) 98.9 F (37.2 C) 98.4 F (36.9 C)  TempSrc:  Oral Oral Oral  SpO2: 94% 92% 98% 92%  Weight:      Height:        Intake/Output Summary (Last 24 hours) at 06/20/2022 0838 Last data filed at 06/20/2022 0600 Gross per  24 hour  Intake 290.71 ml  Output 1050 ml  Net -759.29 ml      06/17/2022   12:25 PM 01/21/2018   10:01 AM 12/21/2017    4:26 PM  Last 3 Weights  Weight (lbs) 250 lb 253 lb 254 lb 3.1 oz  Weight (kg) 113.399 kg 114.76 kg 115.3 kg      Telemetry    Atrial fibrillation with rates primarily 60s-70s. Occasional decreases into the upper 40s. - Personally Reviewed  ECG    No new tracing today - Personally Reviewed  Physical Exam   GEN: No acute distress.   Neck: Unable to assess JVD due to body habitus Cardiac: Irregularly irregular. Systolic murmur best hear RUSB. No rubs, or gallops.  Respiratory: Bibasilar rales GI: Soft, nontender, non-distended  MS: 1+ bilateral lower extremity edema Neuro:  Nonfocal  Psych: Normal affect   Labs    High Sensitivity Troponin:  No results for input(s): "TROPONINIHS" in the last 720 hours.   Chemistry Recent Labs  Lab 06/17/22 1314 06/18/22 0105 06/19/22 0314 06/20/22 0546  NA 134* 134* 137 139  K 4.7 4.3 5.0 4.6  CL 105 104 103 105  CO2 16* 17* 14* 20*  GLUCOSE 168* 134* 149* 111*  BUN 54* 51* 59* 67*  CREATININE 3.81* 3.62* 3.75* 4.13*  CALCIUM 8.8* 8.6* 8.6* 8.5*  PROT 7.3 5.9*  --   --   ALBUMIN 3.2* 2.3*  --   --   AST 16 16  --   --   ALT 8 7  --   --   ALKPHOS 87 71  --   --   BILITOT 0.6 0.4  --   --   GFRNONAA 12* 13* 12* 11*  ANIONGAP 13 13 20* 14    Lipids No results for input(s): "CHOL", "TRIG", "HDL", "LABVLDL", "LDLCALC", "CHOLHDL" in the last 168 hours.  Hematology Recent Labs  Lab 06/18/22 0105 06/19/22 0350 06/20/22 0546  WBC 22.6* 18.6* 14.4*  RBC 2.98* 2.86* 2.61*  HGB 9.1* 8.8* 8.1*  HCT 29.2* 27.3* 25.1*  MCV 98.0 95.5 96.2  MCH 30.5 30.8 31.0  MCHC 31.2 32.2 32.3  RDW 12.5 12.7 12.8  PLT 353 399 369   Thyroid No results for input(s): "TSH", "FREET4" in the last 168 hours.  BNP Recent Labs  Lab 06/17/22 1711  BNP 198.0*    DDimer  Recent Labs  Lab 06/17/22 1711  DDIMER 3.31*      Radiology    DG Chest Port 1 View  Result Date: 06/19/2022 CLINICAL DATA:  Dyspnea EXAM: PORTABLE CHEST 1 VIEW COMPARISON:  06/17/2022 chest radiograph. FINDINGS: Stable cardiomediastinal silhouette with mild cardiomegaly. No pneumothorax. No pleural effusion. Diffuse hazy patchy parahilar lung opacities, most prominent in the upper lungs, worsened. IMPRESSION: Stable mild cardiomegaly. Worsened diffuse hazy patchy parahilar lung opacities, most prominent in the upper lungs, favor pulmonary edema. Electronically Signed   By: Ilona Sorrel M.D.   On: 06/19/2022 08:31   ECHOCARDIOGRAM COMPLETE  Result Date: 06/18/2022    ECHOCARDIOGRAM REPORT   Patient Name:   Courtney Schneider Date of Exam: 06/18/2022 Medical Rec #:  [id number]          Height:       64.0 in Accession #:    [id number]         Weight:       250.0 lb Date of Birth:  07/01/1946         BSA:          2.151 m Patient Age:    76 years           BP:           139/62 mmHg Patient Gender: F                  HR:           75 bpm. Exam Location:  Inpatient Procedure: 2D Echo, Cardiac Doppler, Color Doppler and Intracardiac            Opacification Agent Indications:    CHF-Acute Diastolic E56.31  History:        Patient has no prior history of Echocardiogram examinations.                 CHF, Sepsis; Risk Factors:Hypertension, Dyslipidemia, Diabetes                 and Non-Smoker.  Sonographer:    Greer Pickerel Referring Phys: [id number] Orma Flaming  Sonographer Comments: Technically difficult study due to poor echo windows. Image acquisition challenging due to COPD and Image acquisition challenging due to patient body habitus. IMPRESSIONS  1. Left ventricular ejection fraction, by estimation, is 35 to 40%. The left ventricle has moderately decreased function. The left ventricle demonstrates regional wall motion abnormalities (see  scoring diagram/findings for description). There is mild concentric left ventricular hypertrophy. Left ventricular  diastolic parameters are consistent with Grade I diastolic dysfunction (impaired relaxation). Elevated left ventricular end-diastolic pressure.  2. Right ventricular systolic function is normal. The right ventricular size is normal. There is mildly elevated pulmonary artery systolic pressure.  3. Left atrial size was moderately dilated.  4. The mitral valve is normal in structure. Trivial mitral valve regurgitation. No evidence of mitral stenosis.  5. The aortic valve is tricuspid. There is mild calcification of the aortic valve. There is mild thickening of the aortic valve. Aortic valve regurgitation is not visualized. Mild aortic valve stenosis. Aortic valve area, by VTI measures 1.55 cm. Aortic valve mean gradient measures 12.0 mmHg. Aortic valve Vmax measures 2.20 m/s.  6. The inferior vena cava is dilated in size with >50% respiratory variability, suggesting right atrial pressure of 8 mmHg. FINDINGS  Left Ventricle: Left ventricular ejection fraction, by estimation, is 35 to 40%. The left ventricle has moderately decreased function. The left ventricle demonstrates regional wall motion abnormalities. Definity contrast agent was given IV to delineate the left ventricular endocardial borders. The left ventricular internal cavity size was normal in size. There is mild concentric left ventricular hypertrophy. Left ventricular diastolic parameters are consistent with Grade I diastolic dysfunction (impaired relaxation). Elevated left ventricular end-diastolic pressure.  LV Wall Scoring: The mid and distal anterior wall, mid and distal anterior septum, and entire apex are hypokinetic. The antero-lateral wall, inferior wall, posterior wall, basal anteroseptal segment, mid inferoseptal segment, basal anterior segment, and basal inferoseptal segment are normal. Right Ventricle: The right ventricular size is normal. No increase in right ventricular wall thickness. Right ventricular systolic function is normal. There is  mildly elevated pulmonary artery systolic pressure. The tricuspid regurgitant velocity is 2.65  m/s, and with an assumed right atrial pressure of 8 mmHg, the estimated right ventricular systolic pressure is 35.0 mmHg. Left Atrium: Left atrial size was moderately dilated. Right Atrium: Right atrial size was normal in size. Pericardium: There is no evidence of pericardial effusion. Mitral Valve: The mitral valve is normal in structure. Trivial mitral valve regurgitation. No evidence of mitral valve stenosis. Tricuspid Valve: The tricuspid valve is normal in structure. Tricuspid valve regurgitation is trivial. No evidence of tricuspid stenosis. Aortic Valve: The aortic valve is tricuspid. There is mild calcification of the aortic valve. There is mild thickening of the aortic valve. Aortic valve regurgitation is not visualized. Mild aortic stenosis is present. Aortic valve mean gradient measures  12.0 mmHg. Aortic valve peak gradient measures 19.4 mmHg. Aortic valve area, by VTI measures 1.55 cm. Pulmonic Valve: The pulmonic valve was normal in structure. Pulmonic valve regurgitation is not visualized. No evidence of pulmonic stenosis. Aorta: The aortic root is normal in size and structure. Venous: The inferior vena cava is dilated in size with greater than 50% respiratory variability, suggesting right atrial pressure of 8 mmHg. IAS/Shunts: No atrial level shunt detected by color flow Doppler.  LEFT VENTRICLE PLAX 2D LVIDd:         4.00 cm      Diastology LVIDs:         3.30 cm      LV e' medial:    6.09 cm/s LV PW:         1.20 cm      LV E/e' medial:  17.6 LV IVS:        1.10 cm      LV e' lateral:  3.92 cm/s LVOT diam:     1.90 cm      LV E/e' lateral: 27.3 LV SV:         76 LV SV Index:   35 LVOT Area:     2.84 cm  LV Volumes (MOD) LV vol d, MOD A2C: 146.0 ml LV vol d, MOD A4C: 162.0 ml LV vol s, MOD A2C: 41.4 ml LV vol s, MOD A4C: 110.0 ml LV SV MOD A2C:     104.6 ml LV SV MOD A4C:     162.0 ml LV SV MOD BP:       85.0 ml RIGHT VENTRICLE RV S prime:     9.79 cm/s TAPSE (M-mode): 2.0 cm LEFT ATRIUM           Index        RIGHT ATRIUM           Index LA diam:      3.90 cm 1.81 cm/m   RA Area:     19.90 cm LA Vol (A2C): 82.3 ml 38.26 ml/m  RA Volume:   61.10 ml  28.41 ml/m LA Vol (A4C): 42.6 ml 19.80 ml/m  AORTIC VALVE AV Area (Vmax):    1.53 cm AV Area (Vmean):   1.49 cm AV Area (VTI):     1.55 cm AV Vmax:           220.50 cm/s AV Vmean:          159.000 cm/s AV VTI:            0.489 m AV Peak Grad:      19.4 mmHg AV Mean Grad:      12.0 mmHg LVOT Vmax:         119.00 cm/s LVOT Vmean:        83.500 cm/s LVOT VTI:          0.267 m LVOT/AV VTI ratio: 0.55  AORTA Ao Root diam: 3.30 cm Ao Asc diam:  3.10 cm MITRAL VALVE                TRICUSPID VALVE MV Area (PHT): 3.21 cm     TR Peak grad:   28.1 mmHg MV Decel Time: 236 msec     TR Vmax:        265.00 cm/s MV E velocity: 107.00 cm/s                             SHUNTS                             Systemic VTI:  0.27 m                             Systemic Diam: 1.90 cm Skeet Latch MD Electronically signed by Skeet Latch MD Signature Date/Time: 06/18/2022/6:03:27 PM    Final    VAS [place] LOWER EXTREMITY VENOUS (DVT)  Result Date: 06/18/2022  Lower Venous DVT Study Patient Name:  MARRIETTA THUNDER  Date of Exam:   06/18/2022 Medical Rec #: [id number]           Accession #:    [id number] Date of Birth: 1945/08/24          Patient Gender: F Patient Age:   37 years Exam Location:  [facility] Procedure:      VAS  [place] LOWER EXTREMITY VENOUS (DVT) Referring Phys: DAWOOD ELGERGAWY --------------------------------------------------------------------------------  Indications: Pain, and Edema.  Limitations: Body habitus, poor ultrasound/tissue interface and pain intolerance. Comparison Study: No previous exams Performing Technologist: Jody Hill RVT, RDMS  Examination Guidelines: A complete evaluation includes B-mode imaging, spectral Doppler, color Doppler, and power  Doppler as needed of all accessible portions of each vessel. Bilateral testing is considered an integral part of a complete examination. Limited examinations for reoccurring indications may be performed as noted. The reflux portion of the exam is performed with the patient in reverse Trendelenburg.  +--------+---------------+---------+-----------+----------+--------------------+ RIGHT   CompressibilityPhasicitySpontaneityPropertiesThrombus Aging       +--------+---------------+---------+-----------+----------+--------------------+ CFV     Full           Yes      Yes                                       +--------+---------------+---------+-----------+----------+--------------------+ SFJ     Full                                                              +--------+---------------+---------+-----------+----------+--------------------+ FV Prox Full           Yes      Yes                                       +--------+---------------+---------+-----------+----------+--------------------+ FV Mid  Full           Yes      Yes                                       +--------+---------------+---------+-----------+----------+--------------------+ FV                     Yes      Yes                  patent by            Distal                                               color/doppler        +--------+---------------+---------+-----------+----------+--------------------+ PFV     Full                                                              +--------+---------------+---------+-----------+----------+--------------------+ POP     Full                                                              +--------+---------------+---------+-----------+----------+--------------------+  PTV     Full                                         Not well visualized  +--------+---------------+---------+-----------+----------+--------------------+ PERO    Full                                          Not well visualized  +--------+---------------+---------+-----------+----------+--------------------+   +---------+---------------+---------+-----------+----------+-------------------+ LEFT     CompressibilityPhasicitySpontaneityPropertiesThrombus Aging      +---------+---------------+---------+-----------+----------+-------------------+ CFV      Full           Yes      Yes                                      +---------+---------------+---------+-----------+----------+-------------------+ SFJ      Full                                                             +---------+---------------+---------+-----------+----------+-------------------+ FV Prox  Full           Yes      Yes                                      +---------+---------------+---------+-----------+----------+-------------------+ FV Mid   Full           Yes      Yes                                      +---------+---------------+---------+-----------+----------+-------------------+ FV DistalFull           Yes      Yes                                      +---------+---------------+---------+-----------+----------+-------------------+ PFV      Full                                                             +---------+---------------+---------+-----------+----------+-------------------+ POP      Full           Yes      Yes                                      +---------+---------------+---------+-----------+----------+-------------------+ PTV      Full  Not well visualized +---------+---------------+---------+-----------+----------+-------------------+ PERO     Full                                         Not well visualized +---------+---------------+---------+-----------+----------+-------------------+     Summary: BILATERAL: - No evidence of deep vein thrombosis seen in the lower extremities in areas visualized,  bilaterally. -No evidence of popliteal cyst, bilaterally.   *See table(s) above for measurements and observations. Electronically signed by Servando Snare MD on 06/18/2022 at 5:31:54 PM.    Final    NM Pulmonary Perfusion  Result Date: 06/18/2022 CLINICAL DATA:  76 year old female with shortness of breath. EXAM: NUCLEAR MEDICINE PERFUSION LUNG SCAN TECHNIQUE: Perfusion images were obtained in multiple projections after intravenous injection of radiopharmaceutical. Ventilation scans intentionally deferred if perfusion scan and chest x-ray adequate for interpretation during COVID 19 epidemic. RADIOPHARMACEUTICALS:  4.1 mCi Tc-24mMAA IV COMPARISON:  Portable chest x-ray yesterday 1647 hours. FINDINGS: Homogeneous perfusion radiotracer activity in both lungs when accounting for mediastinal - and on the lateral views upper extremity related photopenia. No convincing perfusion defect. IMPRESSION: Normal perfusion, no evidence of pulmonary embolus. Electronically Signed   By: HGenevie AnnM.D.   On: 06/18/2022 10:16    Cardiac Studies   Echocardiogram 06/18/2022: Impressions: 1. Left ventricular ejection fraction, by estimation, is 35 to 40%. The  left ventricle has moderately decreased function. The left ventricle  demonstrates regional wall motion abnormalities (see scoring  diagram/findings for description). There is mild  concentric left ventricular hypertrophy. Left ventricular diastolic  parameters are consistent with Grade I diastolic dysfunction (impaired  relaxation). Elevated left ventricular end-diastolic pressure.   2. Right ventricular systolic function is normal. The right ventricular  size is normal. There is mildly elevated pulmonary artery systolic  pressure.   3. Left atrial size was moderately dilated.   4. The mitral valve is normal in structure. Trivial mitral valve  regurgitation. No evidence of mitral stenosis.   5. The aortic valve is tricuspid. There is mild calcification of the   aortic valve. There is mild thickening of the aortic valve. Aortic valve  regurgitation is not visualized. Mild aortic valve stenosis. Aortic valve  area, by VTI measures 1.55 cm.  Aortic valve mean gradient measures 12.0 mmHg. Aortic valve Vmax measures  2.20 m/s.   6. The inferior vena cava is dilated in size with >50% respiratory  variability, suggesting right atrial pressure of 8 mmHg.   Patient Profile     CCHANDREA ZELLMANis a 76y.o. female with a history of hypertension, hyperlipidemia, type 2 diabetes mellitus, CKD stage IV, and s/p right transmetatarsal amputation secondary to osteomyelitis in 2010  who is being seen 06/19/2022 for pre-op evaluation for right below knee amputation at the request of Dr. EWaldron Labs    Assessment & Plan    Pre-operative evaluation  Patient admitted in the setting of sepsis 2/2 right foot ulcer and acute osteomyelitis. Ortho recommends right BKA. Surgical risk is complicated by acute systolic CHF (LVEF 320-94% with pulmonary edema and new WMA. Also newly noted afib. Per Revised Cardiac Risk Index, considered class IV risk with 15% 30 day risk for death, MI, or cardiac arrest given history of CHF, diabetes on insulin, and CKD. Ideally patient would undergo LHC prior to surgery but she is strongly against the possibility of needing dialysis. RHC today will help characterize degree of HF contribution  to respiratory status.    Acute systolic and diastolic heart failure  Patient with TTE this admission showing LVEF 35-40% with hypokinesis of mid and distal anterior wall, mid and distal anterior septum, and entire apex. Also shows grade I diastolic dysfunction and aortic stenosis. Admission BNP 198. Has received IV lasix, net negative -759.3L yesterday. Net positive 1.3L this admission.  Creatinine elevated to 4.13 following diuresis yesterday. Patient with notably improved respiratory status this morning, maintaining O2 saturation >92% on 3LPM. Appears  to have improved volume status. Would defer further diuresis until RHC completed. Continue Metoprolol Succinate '50mg'$  QD Continue Hydralazine '10mg'$  TID and Imdur '15mg'$  for afterload reduction. May need adjustment in the setting of worsening renal function.  Likely ischemic cardiomyopathy. Plan for RHC today only to further characterize heart failure as patient does not want to be on dialysis. Will medically manage presumed CAD at this time.   Atrial fibrillation, secondary hypercoagulable state  Patient in sinus rhythm on admission noted with afib overnight 11/21. CHA2DS2-VASc = 5. Heparin initiated given upcoming surgery. Patient transitioned to Metoprolol Succinate '50mg'$  with overall good rate control.  11/21 evening ECG shows coarse afib with ventricular rate ~68bpm.  Continue Metoprolol Succinate '50mg'$  QD. Overnight telemetry shows good rate control following initiation. Continue IV heparin. Transition to [facility] following surgery prior to d/c   Hypertension  Patient borderline hypertensive this admission. Continue Hydralazine '10mg'$  TID, Imdur '15mg'$  QD, Metoprolol Succinate '50mg'$ . Can titrate based on RHC results.   Hyperlipidemia  LDL 64 03/2022. Continue '40mg'$  Atorvastatin as increased yesterday with likely ischemic cardiomyopathy.    Acute on chronic CKD Stage IV  Creatinine up to 4.13 today following IV diuresis yesterday.   Per primary team  Sepsis 2/2 osteomyelitis: Leukocytosis improved to 14.4 today. On Cefepime, Flagyl, Vancomycin DM type II       For questions or updates, please contact [place] Please consult [email or web address] for contact info under        Signed, Lily Kocher, PA-C  06/20/2022, 8:38 AM

## 2022-06-20 NOTE — Progress Notes (Addendum)
ANTICOAGULATION CONSULT NOTE - Follow Up  Pharmacy Consult for Heparin Indication: afib  No Known Allergies  Patient Measurements: Height: '5\' 4"'$  (162.6 cm) Weight: 113.4 kg (250 lb) IBW/kg (Calculated) : 54.7   Vital Signs: Temp: 98.4 F (36.9 C) (11/22 0800) Temp Source: Oral (11/22 0800) BP: 137/96 (11/22 1200) Pulse Rate: 77 (11/22 1200)  Labs: Recent Labs    06/17/22 1314 06/18/22 0105 06/18/22 1056 06/19/22 0314 06/19/22 0350 06/20/22 0546  HGB 9.9* 9.1*  --   --  8.8* 8.1*  HCT 31.3* 29.2*  --   --  27.3* 25.1*  PLT 376 353  --   --  399 369  APTT 34  --   --   --   --   --   LABPROT 14.9  --   --   --   --   --   INR 1.2  --   --   --   --   --   HEPARINUNFRC  --  0.26* 0.57  --   --  0.43  CREATININE 3.81* 3.62*  --  3.75*  --  4.13*     Estimated Creatinine Clearance: 14.8 mL/min (A) (by C-G formula based on SCr of 4.13 mg/dL (H)).   Medical History: Past Medical History:  Diagnosis Date   Diabetes mellitus without complication ([place])    Hypertension    Renal disorder    CKD stage 3     Assessment: 76 y/o F initially on heparin for rule out PE, with negative VQ scan.  Heparin was changed to subq for VTE prophylaxis.  Patient then evaluated by cards for peri-operative risk assessment.  Cards recommending treatment dose heparin continue for afib with plans for cath and possible BKA. CHA2DS2-VASc of 5.  Ultimate plans for oral anticoagulation when procedures completed.  This morning heparin level was therapeutic on heparin at 1500 units/hr.    Anemia of CKD IV,  Hgb 8.1, pltc wnl stable. No bleeding noted.  Later this AM,  Heparin held for cardiac cath per RN's report.    Now s/p right heart cath.  sheath removal right brachial vein @ 11:27, no bleeding or hematoma. No orders received  for restart of heparin post cath. I will follow up with cardiologist for plan for anticoagulation post cath.   Ortho  plans for potential BKA when she is more compensated  from a cardiac standpoint.   Goal of Therapy:  Heparin level 0.3-0.7 units/ml Monitor platelets by anticoagulation protocol: Yes   Plan:  Follow up plan for anticoagulation post cardiac cath 11/22. F/u plan for BKA .    Nicole Cella, RPh Clinical Pharmacist [phone] **Pharmacist phone directory can be found on [email or web address] listed under [place].  06/20/2022 12:40 PM   ADDENDUM:   Dr. [place] gave order to restart IV heparin 2 hours after sheath removal.  heath removal rt brachial vein @ 11:27, no bleeding or hematoma .   Plan:  Restart IV heparin drip 1500 units/hr now. Check 8 hr heparin level  Daily HL and CBC.   Nicole Cella, RPh Clinical Pharmacist 06/20/2022

## 2022-06-20 NOTE — Progress Notes (Signed)
PROGRESS NOTE        PATIENT DETAILS Name: Courtney Schneider Age: 76 y.o. Sex: female Date of Birth: 12/16/1945 Admit Date: 06/17/2022 Admitting Physician Orma Flaming, MD FVC:BSWHQP, Loma Sousa, NP  Brief Summary: Patient is a 76 y.o.  female with history of stage IV CKD, HTN, DM-2, prior TMA-presenting with sepsis due to right foot ulcer with osteomyelitis.  Hospital course complicated by worsening hypoxemia due to new onset HFrEF.  See below for further details.  Significant events: 11/19>> admitted [facility]-sepsis-right foot ulcer/acute osteomyelitis 11/20>> worsening hypoxemia  Significant studies: 11/19>> MRI right forefoot: Mid soft tissue ulceration-with underlying osteomyelitis of residual/fifth metatarsal bases.  No abscess. 11/20>> VQ scan: No PE. 11/20>> echo: EF 59-16%, grade 1 diastolic dysfunction.  Significant microbiology data: 11/19>> COVID/influenza PCR: Negative 11/19>> blood culture: 1/2 gram-positive cocci 11/19>> urine culture: E. coli  Procedures: 11/22>> RHC: Mean pulmonary artery pressure 30 mmHg consistent with mild pulmonary hypertension with phasic 62/15 mmHg.  Mean pulmonary wedge pressure 24 mmHg.  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 V wave in capillary wedge position averaging 40 mmHg. Mixed venous O2 saturation 53%. PAPI 4.36 consistent with normal RV function. Cardiac output 6.5 L/min with index 3.02 L/min/m.  Pulse oximetry arterial O2 saturation 93% with patient on 3 L/min by cannula.  Consults: PCCM Orthopedics Palliative care Cardiology  Subjective: Feels better-down to 3 L of oxygen.  Objective: Vitals: Blood pressure (!) 137/96, pulse 77, temperature 98.4 F (36.9 C), temperature source Oral, resp. rate 20, height '5\' 4"'$  (1.626 m), weight 113.4 kg, SpO2 95 %.   Exam: Gen Exam:Alert awake-not in any distress HEENT:atraumatic,  normocephalic Chest: Few bibasilar rales. CVS:S1S2 regular Abdomen:soft non tender, non distended Extremities:+ edema Neurology: Non focal Skin: no rash  Pertinent Labs/Radiology:    Latest Ref Rng & Units 06/20/2022    5:46 AM 06/19/2022    3:50 AM 06/18/2022    1:05 AM  CBC  WBC 4.0 - 10.5 K/uL 14.4  18.6  22.6   Hemoglobin 12.0 - 15.0 g/dL 8.1  8.8  9.1   Hematocrit 36.0 - 46.0 % 25.1  27.3  29.2   Platelets 150 - 400 K/uL 369  399  353     Lab Results  Component Value Date   NA 139 06/20/2022   K 4.6 06/20/2022   CL 105 06/20/2022   CO2 20 (L) 06/20/2022      Assessment/Plan: Sepsis secondary to right diabetic foot ulcer with underlying acute osteomyelitis Sepsis physiology improved 1/2 blood culture positive for gram-positive cocci-likely contamination Continue broad-spectrum antimicrobial therapy Dr. Sharol Given following-with plans for potential BKA when she is more compensated from a cardiac standpoint.  Acute hypoxic respiratory failure due to acute on chronic diastolic/systolic heart failure Hypoxia improving-down to 3 L of HFNC Remains volume overloaded Continue IV diuretics. Remains on beta-blocker/hydralazine/Imdur Not a candidate for ARB/Entresto given advanced CKD.  AKI on CKD stage IV Hemodynamically mediated/on diuretics Remains volume overloaded-but hypoxia improving Continue IV diuretics till volume status improves further Does not desire renal replacement therapy/dialysis.  New onset atrial fibrillation Rate controlled with metoprolol CHA2DS2-VASc of 5-heparin to be resumed post RHC.  HTN Stable Continue monitor/Imdur/hydralazine  DM-2 (A1c 6.5 on Sept 2023) CBGs stable with Semglee 10 units daily and SSI Follow/optimize   Recent Labs  06/19/22 2125 06/20/22 0815 06/20/22 1218  GLUCAP 121* 103* 120*   History of right transmetatarsal amputation  Palliative care DNR in place Does not desire dialysis Gentle medical  treatment Palliative care following-await further recommendations  Morbid Obesity: Estimated body mass index is 42.91 kg/m as calculated from the following:   Height as of this encounter: '5\' 4"'$  (1.626 m).   Weight as of this encounter: 113.4 kg.   Code status:   Code Status: DNR   DVT Prophylaxis: IV Heparin   Family Communication: Daughter at bedside   Disposition Plan: Status is: Inpatient Remains inpatient appropriate because: Severity of illness   Planned Discharge Destination:Home with family-not yet stable for d/c   Diet: Diet Order             Diet Carb Modified Fluid consistency: Thin; Room service appropriate? Yes  Diet effective now                     Antimicrobial agents: Anti-infectives (From admission, onward)    Start     Dose/Rate Route Frequency Ordered Stop   06/20/22 0800  ceFAZolin (ANCEF) IVPB 2g/100 mL premix  Status:  Discontinued        2 g 200 mL/hr over 30 Minutes Intravenous On call to O.R. 06/20/22 0108 06/20/22 1145   06/19/22 1400  vancomycin (VANCOCIN) IVPB 1000 mg/200 mL premix        1,000 mg 200 mL/hr over 60 Minutes Intravenous Every 48 hours 06/17/22 1410     06/18/22 1400  cefTRIAXone (ROCEPHIN) 2 g in sodium chloride 0.9 % 100 mL IVPB  Status:  Discontinued        2 g 200 mL/hr over 30 Minutes Intravenous Every 24 hours 06/17/22 1642 06/18/22 0932   06/18/22 1030  ceFEPIme (MAXIPIME) 2 g in sodium chloride 0.9 % 100 mL IVPB        2 g 200 mL/hr over 30 Minutes Intravenous Every 24 hours 06/18/22 0932     06/18/22 0400  metroNIDAZOLE (FLAGYL) IVPB 500 mg        500 mg 100 mL/hr over 60 Minutes Intravenous Every 12 hours 06/17/22 1635     06/17/22 1630  metroNIDAZOLE (FLAGYL) IVPB 500 mg  Status:  Discontinued        500 mg 100 mL/hr over 60 Minutes Intravenous Every 12 hours 06/17/22 1623 06/17/22 1636   06/17/22 1400  vancomycin (VANCOREADY) IVPB 2000 mg/400 mL        2,000 mg 200 mL/hr over 120 Minutes Intravenous   Once 06/17/22 1345 06/17/22 1716   06/17/22 1345  vancomycin (VANCOCIN) IVPB 1000 mg/200 mL premix  Status:  Discontinued        1,000 mg 200 mL/hr over 60 Minutes Intravenous  Once 06/17/22 1333 06/17/22 1345   06/17/22 1345  cefTRIAXone (ROCEPHIN) 2 g in sodium chloride 0.9 % 100 mL IVPB        2 g 200 mL/hr over 30 Minutes Intravenous  Once 06/17/22 1333 06/17/22 1439   06/17/22 1345  metroNIDAZOLE (FLAGYL) tablet 500 mg        500 mg Oral  Once 06/17/22 1333 06/17/22 1513        MEDICATIONS: Scheduled Meds:  atorvastatin  40 mg Oral Daily   Chlorhexidine Gluconate Cloth  6 each Topical Q0600   hydrALAZINE  10 mg Oral Q8H   insulin aspart  0-9 Units Subcutaneous TID WC   insulin glargine-yfgn  10 Units Subcutaneous QHS  isosorbide mononitrate  15 mg Oral Daily   melatonin  5 mg Oral QHS   metoprolol succinate  50 mg Oral Daily   mupirocin ointment  1 Application Nasal BID   sodium bicarbonate  1,300 mg Oral QID   sodium chloride flush  3 mL Intravenous Q12H   sodium chloride flush  3 mL Intravenous Q12H   traZODone  50 mg Oral QHS   Continuous Infusions:  sodium chloride     ceFEPime (MAXIPIME) IV Stopped (06/20/22 1023)   heparin 1,500 Units/hr (06/20/22 1157)   metronidazole 500 mg (06/20/22 0447)   vancomycin 1,000 mg (06/19/22 1443)   PRN Meds:.sodium chloride, acetaminophen, hydrALAZINE, HYDROcodone-acetaminophen, labetalol, ondansetron **OR** ondansetron (ZOFRAN) IV, mouth rinse, sodium chloride flush   I have personally reviewed following labs and imaging studies  LABORATORY DATA: CBC: Recent Labs  Lab 06/17/22 1314 06/18/22 0105 06/19/22 0350 06/20/22 0546  WBC 20.9* 22.6* 18.6* 14.4*  NEUTROABS 18.0*  --   --   --   HGB 9.9* 9.1* 8.8* 8.1*  HCT 31.3* 29.2* 27.3* 25.1*  MCV 96.3 98.0 95.5 96.2  PLT 376 353 399 161    Basic Metabolic Panel: Recent Labs  Lab 06/17/22 1314 06/18/22 0105 06/19/22 0314 06/20/22 0546  NA 134* 134* 137 139  K 4.7  4.3 5.0 4.6  CL 105 104 103 105  CO2 16* 17* 14* 20*  GLUCOSE 168* 134* 149* 111*  BUN 54* 51* 59* 67*  CREATININE 3.81* 3.62* 3.75* 4.13*  CALCIUM 8.8* 8.6* 8.6* 8.5*    GFR: Estimated Creatinine Clearance: 14.8 mL/min (A) (by C-G formula based on SCr of 4.13 mg/dL (H)).  Liver Function Tests: Recent Labs  Lab 06/17/22 1314 06/18/22 0105  AST 16 16  ALT 8 7  ALKPHOS 87 71  BILITOT 0.6 0.4  PROT 7.3 5.9*  ALBUMIN 3.2* 2.3*   No results for input(s): "LIPASE", "AMYLASE" in the last 168 hours. No results for input(s): "AMMONIA" in the last 168 hours.  Coagulation Profile: Recent Labs  Lab 06/17/22 1314  INR 1.2    Cardiac Enzymes: No results for input(s): "CKTOTAL", "CKMB", "CKMBINDEX", "TROPONINI" in the last 168 hours.  BNP (last 3 results) No results for input(s): "PROBNP" in the last 8760 hours.  Lipid Profile: No results for input(s): "CHOL", "HDL", "LDLCALC", "TRIG", "CHOLHDL", "LDLDIRECT" in the last 72 hours.  Thyroid Function Tests: No results for input(s): "TSH", "T4TOTAL", "FREET4", "T3FREE", "THYROIDAB" in the last 72 hours.  Anemia Panel: No results for input(s): "VITAMINB12", "FOLATE", "FERRITIN", "TIBC", "IRON", "RETICCTPCT" in the last 72 hours.  Urine analysis:    Component Value Date/Time   COLORURINE YELLOW 06/18/2022 0656   APPEARANCEUR CLOUDY (A) 06/18/2022 0656   LABSPEC 1.006 06/18/2022 0656   PHURINE 5.0 06/18/2022 0656   GLUCOSEU NEGATIVE 06/18/2022 0656   HGBUR SMALL (A) 06/18/2022 0656   BILIRUBINUR NEGATIVE 06/18/2022 0656   KETONESUR NEGATIVE 06/18/2022 0656   PROTEINUR 30 (A) 06/18/2022 0656   NITRITE NEGATIVE 06/18/2022 0656   LEUKOCYTESUR TRACE (A) 06/18/2022 0656    Sepsis Labs: Lactic Acid, Venous    Component Value Date/Time   LATICACIDVEN 1.6 06/17/2022 1448    MICROBIOLOGY: Recent Results (from the past 240 hour(s))  Resp Panel by RT-PCR (Flu A&B, Covid) Anterior Nasal Swab     Status: None   Collection  Time: 06/17/22  1:07 PM   Specimen: Anterior Nasal Swab  Result Value Ref Range Status   SARS Coronavirus 2 by RT PCR NEGATIVE NEGATIVE Final  Comment: (NOTE) SARS-CoV-2 target nucleic acids are NOT DETECTED.  The SARS-CoV-2 RNA is generally detectable in upper respiratory specimens during the acute phase of infection. The lowest concentration of SARS-CoV-2 viral copies this assay can detect is 138 copies/mL. A negative result does not preclude SARS-Cov-2 infection and should not be used as the sole basis for treatment or other patient management decisions. A negative result may occur with  improper specimen collection/handling, submission of specimen other than nasopharyngeal swab, presence of viral mutation(s) within the areas targeted by this assay, and inadequate number of viral copies(<138 copies/mL). A negative result must be combined with clinical observations, patient history, and epidemiological information. The expected result is Negative.  Fact Sheet for Patients:  EntrepreneurPulse.com.au  Fact Sheet for Healthcare Providers:  IncredibleEmployment.be  This test is no t yet approved or cleared by the [place] FDA and  has been authorized for detection and/or diagnosis of SARS-CoV-2 by FDA under an Emergency Use Authorization (EUA). This EUA will remain  in effect (meaning this test can be used) for the duration of the COVID-19 declaration under Section 564(b)(1) of the Act, 21 U.S.C.section 360bbb-3(b)(1), unless the authorization is terminated  or revoked sooner.       Influenza A by PCR NEGATIVE NEGATIVE Final   Influenza B by PCR NEGATIVE NEGATIVE Final    Comment: (NOTE) The Xpert Xpress SARS-CoV-2/FLU/RSV plus assay is intended as an aid in the diagnosis of influenza from Nasopharyngeal swab specimens and should not be used as a sole basis for treatment. Nasal washings and aspirates are unacceptable for Xpert Xpress  SARS-CoV-2/FLU/RSV testing.  Fact Sheet for Patients: EntrepreneurPulse.com.au  Fact Sheet for Healthcare Providers: IncredibleEmployment.be  This test is not yet approved or cleared by the [place] FDA and has been authorized for detection and/or diagnosis of SARS-CoV-2 by FDA under an Emergency Use Authorization (EUA). This EUA will remain in effect (meaning this test can be used) for the duration of the COVID-19 declaration under Section 564(b)(1) of the Act, 21 U.S.C. section 360bbb-3(b)(1), unless the authorization is terminated or revoked.  Performed at [facility], [address] [place], [place] [address]   Culture, blood (Routine x 2)     Status: None (Preliminary result)   Collection Time: 06/17/22  1:14 PM   Specimen: Right Antecubital; Blood  Result Value Ref Range Status   Specimen Description   Final    RIGHT ANTECUBITAL BOTTLES DRAWN AEROBIC AND ANAEROBIC Performed at [facility], [address], [place], [place] [address]    Special Requests   Final    Blood Culture adequate volume Performed at [place] [place], [place] [address]    Culture  Setup Time   Final    GRAM POSITIVE COCCI WBC PRESENT,BOTH PMN AND MONONUCLEAR RESULT CALLED TO, READ BACK BY AND VERIFIED WITH: called J. Teffer RN at [facility]  on 06/19/22 R.Byrd CRITICAL RESULT CALLED TO, READ BACK BY AND VERIFIED WITH: PHARMD JEREMY F. 1446 [id number] FCP ANAEROBIC BOTTLE ONLY Performed at [facility], [place] [address], [place], [place] [address]    Culture GRAM POSITIVE COCCI  Final   Report Status PENDING  Incomplete  Culture, blood (Routine x 2)     Status: None (Preliminary result)   Collection Time: 06/17/22  1:14 PM   Specimen: Left Antecubital; Blood  Result Value Ref Range Status   Specimen Description   Final    LEFT ANTECUBITAL BOTTLES DRAWN AEROBIC AND ANAEROBIC  Special Requests   Final    Blood Culture results may not be  optimal due to an excessive volume of blood received in culture bottles   Culture   Final    NO GROWTH 2 DAYS Performed at [facility], [address], [place], [place] [address]    Report Status PENDING  Incomplete  Blood Culture ID Panel (Reflexed)     Status: None   Collection Time: 06/17/22  1:14 PM  Result Value Ref Range Status   Enterococcus faecalis NOT DETECTED NOT DETECTED Final   Enterococcus Faecium NOT DETECTED NOT DETECTED Final   Listeria monocytogenes NOT DETECTED NOT DETECTED Final   Staphylococcus species NOT DETECTED NOT DETECTED Final   Staphylococcus aureus (BCID) NOT DETECTED NOT DETECTED Final   Staphylococcus epidermidis NOT DETECTED NOT DETECTED Final   Staphylococcus lugdunensis NOT DETECTED NOT DETECTED Final   Streptococcus species NOT DETECTED NOT DETECTED Final   Streptococcus agalactiae NOT DETECTED NOT DETECTED Final   Streptococcus pneumoniae NOT DETECTED NOT DETECTED Final   Streptococcus pyogenes NOT DETECTED NOT DETECTED Final   A.calcoaceticus-baumannii NOT DETECTED NOT DETECTED Final   Bacteroides fragilis NOT DETECTED NOT DETECTED Final   Enterobacterales NOT DETECTED NOT DETECTED Final   Enterobacter cloacae complex NOT DETECTED NOT DETECTED Final   Escherichia coli NOT DETECTED NOT DETECTED Final   Klebsiella aerogenes NOT DETECTED NOT DETECTED Final   Klebsiella oxytoca NOT DETECTED NOT DETECTED Final   Klebsiella pneumoniae NOT DETECTED NOT DETECTED Final   Proteus species NOT DETECTED NOT DETECTED Final   Salmonella species NOT DETECTED NOT DETECTED Final   Serratia marcescens NOT DETECTED NOT DETECTED Final   Haemophilus influenzae NOT DETECTED NOT DETECTED Final   Neisseria meningitidis NOT DETECTED NOT DETECTED Final   Pseudomonas aeruginosa NOT DETECTED NOT DETECTED Final   Stenotrophomonas maltophilia NOT DETECTED NOT DETECTED Final   Candida albicans NOT DETECTED NOT DETECTED Final   Candida auris NOT DETECTED NOT DETECTED Final    Candida glabrata NOT DETECTED NOT DETECTED Final   Candida krusei NOT DETECTED NOT DETECTED Final   Candida parapsilosis NOT DETECTED NOT DETECTED Final   Candida tropicalis NOT DETECTED NOT DETECTED Final   Cryptococcus neoformans/gattii NOT DETECTED NOT DETECTED Final    Comment: Performed at [facility], [address] [place], [place] [address]  Urine Culture     Status: Abnormal   Collection Time: 06/17/22  8:38 PM   Specimen: In/Out Cath Urine  Result Value Ref Range Status   Specimen Description IN/OUT CATH URINE  Final   Special Requests   Final    NONE Performed at [facility], [place] [address], [place], [place] [address]    Culture 10,000 COLONIES/mL ESCHERICHIA COLI (A)  Final   Report Status 06/20/2022 FINAL  Final   Organism ID, Bacteria ESCHERICHIA COLI (A)  Final      Susceptibility   Escherichia coli - MIC*    AMPICILLIN <=2 SENSITIVE Sensitive     CEFAZOLIN <=4 SENSITIVE Sensitive     CEFEPIME <=0.12 SENSITIVE Sensitive     CEFTRIAXONE <=0.25 SENSITIVE Sensitive     CIPROFLOXACIN <=0.25 SENSITIVE Sensitive     GENTAMICIN <=1 SENSITIVE Sensitive     IMIPENEM <=0.25 SENSITIVE Sensitive     NITROFURANTOIN <=16 SENSITIVE Sensitive     TRIMETH/SULFA <=20 SENSITIVE Sensitive     AMPICILLIN/SULBACTAM <=2 SENSITIVE Sensitive     PIP/TAZO <=4 SENSITIVE Sensitive     * 10,000 COLONIES/mL ESCHERICHIA COLI  MRSA Next Gen by PCR, Nasal     Status: Abnormal   Collection Time: 06/19/22  5:23 PM   Specimen: Nasal Mucosa; Nasal Swab  Result Value Ref Range Status   MRSA by PCR Next Gen DETECTED (A) NOT DETECTED Final    Comment: RESULT CALLED TO, READ BACK BY AND VERIFIED WITH: RN A. LOWE [id number] '@[id number]'$  FH (NOTE) The GeneXpert MRSA Assay (FDA approved for NASAL specimens only), is one component of a comprehensive MRSA colonization surveillance program. It is not intended to diagnose MRSA infection nor to guide or monitor treatment for MRSA infections. Test  performance is not FDA approved in patients less than 66 years old. Performed at [facility], [place] [address], [place], [place] [address]     RADIOLOGY STUDIES/RESULTS: CARDIAC CATHETERIZATION  Result Date: 06/20/2022 FINDINGS: Mean pulmonary artery pressure 30 mmHg consistent with mild pulmonary hypertension with phasic 62/15 mmHg.  Mean pulmonary wedge pressure 24 mmHg.  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 V wave in capillary wedge position averaging 40 mmHg. Mixed venous O2 saturation 53%. PAPI 4.36 consistent with normal RV function. Cardiac output 6.5 L/min with index 3.02 L/min/m.  Pulse oximetry arterial O2 saturation 93% with patient on 3 L/min by cannula. CONCLUSIONS: Findings are consistent left heart failure with pulmonary capillary wedge mean pressure 24 mmHg. Normal cardiac output. Mild pulmonary hypertension Mild elevation in right atrial pressure with normal right heart function based on PAPI.   DG Chest Port 1 View  Result Date: 06/19/2022 CLINICAL DATA:  Dyspnea EXAM: PORTABLE CHEST 1 VIEW COMPARISON:  06/17/2022 chest radiograph. FINDINGS: Stable cardiomediastinal silhouette with mild cardiomegaly. No pneumothorax. No pleural effusion. Diffuse hazy patchy parahilar lung opacities, most prominent in the upper lungs, worsened. IMPRESSION: Stable mild cardiomegaly. Worsened diffuse hazy patchy parahilar lung opacities, most prominent in the upper lungs, favor pulmonary edema. Electronically Signed   By: Ilona Sorrel M.D.   On: 06/19/2022 08:31   ECHOCARDIOGRAM COMPLETE  Result Date: 06/18/2022    ECHOCARDIOGRAM REPORT   Patient Name:   MEIA EMLEY Date of Exam: 06/18/2022 Medical Rec #:  [id number]          Height:       64.0 in Accession #:    [id number]         Weight:       250.0 lb Date of Birth:  May 19, 1946         BSA:          2.151 m Patient Age:    64 years           BP:            139/62 mmHg Patient Gender: F                  HR:           75 bpm. Exam Location:  Inpatient Procedure: 2D Echo, Cardiac Doppler, Color Doppler and Intracardiac            Opacification Agent Indications:    CHF-Acute Diastolic Z16.96  History:        Patient has no prior history of Echocardiogram examinations.                 CHF, Sepsis; Risk Factors:Hypertension, Dyslipidemia, Diabetes                 and Non-Smoker.  Sonographer:  Greer Pickerel Referring Phys: [id number] [facility]  Sonographer Comments: Technically difficult study due to poor echo windows. Image acquisition challenging due to COPD and Image acquisition challenging due to patient body habitus. IMPRESSIONS  1. Left ventricular ejection fraction, by estimation, is 35 to 40%. The left ventricle has moderately decreased function. The left ventricle demonstrates regional wall motion abnormalities (see scoring diagram/findings for description). There is mild concentric left ventricular hypertrophy. Left ventricular diastolic parameters are consistent with Grade I diastolic dysfunction (impaired relaxation). Elevated left ventricular end-diastolic pressure.  2. Right ventricular systolic function is normal. The right ventricular size is normal. There is mildly elevated pulmonary artery systolic pressure.  3. Left atrial size was moderately dilated.  4. The mitral valve is normal in structure. Trivial mitral valve regurgitation. No evidence of mitral stenosis.  5. The aortic valve is tricuspid. There is mild calcification of the aortic valve. There is mild thickening of the aortic valve. Aortic valve regurgitation is not visualized. Mild aortic valve stenosis. Aortic valve area, by VTI measures 1.55 cm. Aortic valve mean gradient measures 12.0 mmHg. Aortic valve Vmax measures 2.20 m/s.  6. The inferior vena cava is dilated in size with >50% respiratory variability, suggesting right atrial pressure of 8 mmHg. FINDINGS  Left Ventricle: Left  ventricular ejection fraction, by estimation, is 35 to 40%. The left ventricle has moderately decreased function. The left ventricle demonstrates regional wall motion abnormalities. Definity contrast agent was given IV to delineate the left ventricular endocardial borders. The left ventricular internal cavity size was normal in size. There is mild concentric left ventricular hypertrophy. Left ventricular diastolic parameters are consistent with Grade I diastolic dysfunction (impaired relaxation). Elevated left ventricular end-diastolic pressure.  LV Wall Scoring: The mid and distal anterior wall, mid and distal anterior septum, and entire apex are hypokinetic. The antero-lateral wall, inferior wall, posterior wall, basal anteroseptal segment, mid inferoseptal segment, basal anterior segment, and basal inferoseptal segment are normal. Right Ventricle: The right ventricular size is normal. No increase in right ventricular wall thickness. Right ventricular systolic function is normal. There is mildly elevated pulmonary artery systolic pressure. The tricuspid regurgitant velocity is 2.65  m/s, and with an assumed right atrial pressure of 8 mmHg, the estimated right ventricular systolic pressure is 65.0 mmHg. Left Atrium: Left atrial size was moderately dilated. Right Atrium: Right atrial size was normal in size. Pericardium: There is no evidence of pericardial effusion. Mitral Valve: The mitral valve is normal in structure. Trivial mitral valve regurgitation. No evidence of mitral valve stenosis. Tricuspid Valve: The tricuspid valve is normal in structure. Tricuspid valve regurgitation is trivial. No evidence of tricuspid stenosis. Aortic Valve: The aortic valve is tricuspid. There is mild calcification of the aortic valve. There is mild thickening of the aortic valve. Aortic valve regurgitation is not visualized. Mild aortic stenosis is present. Aortic valve mean gradient measures  12.0 mmHg. Aortic valve peak gradient  measures 19.4 mmHg. Aortic valve area, by VTI measures 1.55 cm. Pulmonic Valve: The pulmonic valve was normal in structure. Pulmonic valve regurgitation is not visualized. No evidence of pulmonic stenosis. Aorta: The aortic root is normal in size and structure. Venous: The inferior vena cava is dilated in size with greater than 50% respiratory variability, suggesting right atrial pressure of 8 mmHg. IAS/Shunts: No atrial level shunt detected by color flow Doppler.  LEFT VENTRICLE PLAX 2D LVIDd:         4.00 cm      Diastology LVIDs:  3.30 cm      LV e' medial:    6.09 cm/s LV PW:         1.20 cm      LV E/e' medial:  17.6 LV IVS:        1.10 cm      LV e' lateral:   3.92 cm/s LVOT diam:     1.90 cm      LV E/e' lateral: 27.3 LV SV:         76 LV SV Index:   35 LVOT Area:     2.84 cm  LV Volumes (MOD) LV vol d, MOD A2C: 146.0 ml LV vol d, MOD A4C: 162.0 ml LV vol s, MOD A2C: 41.4 ml LV vol s, MOD A4C: 110.0 ml LV SV MOD A2C:     104.6 ml LV SV MOD A4C:     162.0 ml LV SV MOD BP:      85.0 ml RIGHT VENTRICLE RV S prime:     9.79 cm/s TAPSE (M-mode): 2.0 cm LEFT ATRIUM           Index        RIGHT ATRIUM           Index LA diam:      3.90 cm 1.81 cm/m   RA Area:     19.90 cm LA Vol (A2C): 82.3 ml 38.26 ml/m  RA Volume:   61.10 ml  28.41 ml/m LA Vol (A4C): 42.6 ml 19.80 ml/m  AORTIC VALVE AV Area (Vmax):    1.53 cm AV Area (Vmean):   1.49 cm AV Area (VTI):     1.55 cm AV Vmax:           220.50 cm/s AV Vmean:          159.000 cm/s AV VTI:            0.489 m AV Peak Grad:      19.4 mmHg AV Mean Grad:      12.0 mmHg LVOT Vmax:         119.00 cm/s LVOT Vmean:        83.500 cm/s LVOT VTI:          0.267 m LVOT/AV VTI ratio: 0.55  AORTA Ao Root diam: 3.30 cm Ao Asc diam:  3.10 cm MITRAL VALVE                TRICUSPID VALVE MV Area (PHT): 3.21 cm     TR Peak grad:   28.1 mmHg MV Decel Time: 236 msec     TR Vmax:        265.00 cm/s MV E velocity: 107.00 cm/s                             SHUNTS                              Systemic VTI:  0.27 m                             Systemic Diam: 1.90 cm Skeet Latch MD Electronically signed by Skeet Latch MD Signature Date/Time: 06/18/2022/6:03:27 PM    Final    VAS [place] LOWER EXTREMITY VENOUS (DVT)  Result Date: 06/18/2022  Lower Venous DVT Study Patient Name:  ELINE GENG  Date of Exam:  06/18/2022 Medical Rec #: [id number]           Accession #:    [id number] Date of Birth: 07-May-1946          Patient Gender: F Patient Age:   29 years Exam Location:  [facility] Procedure:      VAS [place] LOWER EXTREMITY VENOUS (DVT) Referring Phys: DAWOOD ELGERGAWY --------------------------------------------------------------------------------  Indications: Pain, and Edema.  Limitations: Body habitus, poor ultrasound/tissue interface and pain intolerance. Comparison Study: No previous exams Performing Technologist: Jody Hill RVT, RDMS  Examination Guidelines: A complete evaluation includes B-mode imaging, spectral Doppler, color Doppler, and power Doppler as needed of all accessible portions of each vessel. Bilateral testing is considered an integral part of a complete examination. Limited examinations for reoccurring indications may be performed as noted. The reflux portion of the exam is performed with the patient in reverse Trendelenburg.  +--------+---------------+---------+-----------+----------+--------------------+ RIGHT   CompressibilityPhasicitySpontaneityPropertiesThrombus Aging       +--------+---------------+---------+-----------+----------+--------------------+ CFV     Full           Yes      Yes                                       +--------+---------------+---------+-----------+----------+--------------------+ SFJ     Full                                                              +--------+---------------+---------+-----------+----------+--------------------+ FV Prox Full           Yes      Yes                                        +--------+---------------+---------+-----------+----------+--------------------+ FV Mid  Full           Yes      Yes                                       +--------+---------------+---------+-----------+----------+--------------------+ FV                     Yes      Yes                  patent by            Distal                                               color/doppler        +--------+---------------+---------+-----------+----------+--------------------+ PFV     Full                                                              +--------+---------------+---------+-----------+----------+--------------------+ POP  Full                                                              +--------+---------------+---------+-----------+----------+--------------------+ PTV     Full                                         Not well visualized  +--------+---------------+---------+-----------+----------+--------------------+ PERO    Full                                         Not well visualized  +--------+---------------+---------+-----------+----------+--------------------+   +---------+---------------+---------+-----------+----------+-------------------+ LEFT     CompressibilityPhasicitySpontaneityPropertiesThrombus Aging      +---------+---------------+---------+-----------+----------+-------------------+ CFV      Full           Yes      Yes                                      +---------+---------------+---------+-----------+----------+-------------------+ SFJ      Full                                                             +---------+---------------+---------+-----------+----------+-------------------+ FV Prox  Full           Yes      Yes                                      +---------+---------------+---------+-----------+----------+-------------------+ FV Mid   Full           Yes      Yes                                       +---------+---------------+---------+-----------+----------+-------------------+ FV DistalFull           Yes      Yes                                      +---------+---------------+---------+-----------+----------+-------------------+ PFV      Full                                                             +---------+---------------+---------+-----------+----------+-------------------+ POP      Full           Yes      Yes                                      +---------+---------------+---------+-----------+----------+-------------------+  PTV      Full                                         Not well visualized +---------+---------------+---------+-----------+----------+-------------------+ PERO     Full                                         Not well visualized +---------+---------------+---------+-----------+----------+-------------------+     Summary: BILATERAL: - No evidence of deep vein thrombosis seen in the lower extremities in areas visualized, bilaterally. -No evidence of popliteal cyst, bilaterally.   *See table(s) above for measurements and observations. Electronically signed by Servando Snare MD on 06/18/2022 at 5:31:54 PM.    Final      LOS: 3 days   Oren Binet, MD  [facility]    To contact the attending provider between 7A-7P or the covering provider during after hours 7P-7A, please log into the web site [email or web address] and access using universal [place] password for that web site. If you do not have the password, please call the hospital operator.  06/20/2022, 12:21 PM

## 2022-06-21 DIAGNOSIS — I5032 Chronic diastolic (congestive) heart failure: Secondary | ICD-10-CM | POA: Diagnosis not present

## 2022-06-21 DIAGNOSIS — I480 Paroxysmal atrial fibrillation: Secondary | ICD-10-CM | POA: Diagnosis not present

## 2022-06-21 DIAGNOSIS — I5043 Acute on chronic combined systolic (congestive) and diastolic (congestive) heart failure: Secondary | ICD-10-CM | POA: Diagnosis not present

## 2022-06-21 DIAGNOSIS — Z515 Encounter for palliative care: Secondary | ICD-10-CM | POA: Diagnosis not present

## 2022-06-21 DIAGNOSIS — Z7189 Other specified counseling: Secondary | ICD-10-CM

## 2022-06-21 DIAGNOSIS — A419 Sepsis, unspecified organism: Secondary | ICD-10-CM | POA: Diagnosis not present

## 2022-06-21 LAB — CULTURE, BLOOD (ROUTINE X 2): Special Requests: ADEQUATE

## 2022-06-21 LAB — BASIC METABOLIC PANEL
Anion gap: 11 (ref 5–15)
BUN: 73 mg/dL — ABNORMAL HIGH (ref 8–23)
CO2: 20 mmol/L — ABNORMAL LOW (ref 22–32)
Calcium: 8.2 mg/dL — ABNORMAL LOW (ref 8.9–10.3)
Chloride: 106 mmol/L (ref 98–111)
Creatinine, Ser: 4.04 mg/dL — ABNORMAL HIGH (ref 0.44–1.00)
GFR, Estimated: 11 mL/min — ABNORMAL LOW (ref >60)
Glucose, Bld: 119 mg/dL — ABNORMAL HIGH (ref 70–99)
Potassium: 4 mmol/L (ref 3.5–5.1)
Sodium: 137 mmol/L (ref 135–145)

## 2022-06-21 LAB — CBC
HCT: 24.9 % — ABNORMAL LOW (ref 36.0–46.0)
Hemoglobin: 8 g/dL — ABNORMAL LOW (ref 12.0–15.0)
MCH: 30.9 pg (ref 26.0–34.0)
MCHC: 32.1 g/dL (ref 30.0–36.0)
MCV: 96.1 fL (ref 80.0–100.0)
Platelets: 360 10*3/uL (ref 150–400)
RBC: 2.59 MIL/uL — ABNORMAL LOW (ref 3.87–5.11)
RDW: 12.9 % (ref 11.5–15.5)
WBC: 12.4 10*3/uL — ABNORMAL HIGH (ref 4.0–10.5)
nRBC: 0 % (ref 0.0–0.2)

## 2022-06-21 LAB — GLUCOSE, CAPILLARY
Glucose-Capillary: 134 mg/dL — ABNORMAL HIGH (ref 70–99)
Glucose-Capillary: 118 mg/dL — ABNORMAL HIGH (ref 70–99)
Glucose-Capillary: 127 mg/dL — ABNORMAL HIGH (ref 70–99)

## 2022-06-21 LAB — HEPARIN LEVEL (UNFRACTIONATED): Heparin Unfractionated: 0.49 IU/mL (ref 0.30–0.70)

## 2022-06-21 NOTE — Progress Notes (Addendum)
Pharmacy Antibiotic Note  Courtney Schneider is a 76 y.o. female admitted on 06/17/2022 with cellulitis. Pharmacy consulted for vancomycin and cefepime dosing.  Initial concern for pneumonia when started on cefepime. CCM assessment showed improvement in symptoms of hypoxemia with increased dose of lasix. RHC showed elevated pulmonary capillary wedge pressure and elevated BNP. Edema on CXR. Likely more fluid status vs infectious etiology. Will continue to treat her diabetic foot ulcer with underlying osteomyelitis. Dr. Sharol Given following-with plans for potential BKA when she is more compensated from a cardiac standpoint.    AM addendum: patient now comfort care. Will d/c abx.   Plan: Continue Cefepime 2g IV q24hr Continue Vancomycin 1000 mg IV every 48 hours Monitor renal function, cultures, and vanco level as indicated Narrow abx as able and f/u with appropriate duration   Height: '5\' 4"'$  (162.6 cm) Weight: 113.4 kg (250 lb) IBW/kg (Calculated) : 54.7  Temp (24hrs), Avg:98.4 F (36.9 C), Min:98.3 F (36.8 C), Max:98.4 F (36.9 C)  Recent Labs  Lab 06/17/22 1314 06/17/22 1448 06/18/22 0105 06/19/22 0314 06/19/22 0350 06/20/22 0546 06/21/22 0632  WBC 20.9*  --  22.6*  --  18.6* 14.4* 12.4*  CREATININE 3.81*  --  3.62* 3.75*  --  4.13* 4.04*  LATICACIDVEN 3.0* 1.6  --   --   --   --   --      Estimated Creatinine Clearance: 15.1 mL/min (A) (by C-G formula based on SCr of 4.04 mg/dL (H)).    No Known Allergies  Antimicrobials this admission: Vanco 11/19 >> CTX 11/19 >11/20 Flagyl 11/19 >> Cefepime 11/20>>  Microbiology results: 11/19 BCx: gram pos cocci 1/2  11/19 UCx: pansensitive e.coli  11/21 MRSA: positive  11/19 COVID : neg:  flu: neg   Thank you for allowing pharmacy to be a part of this patient's care.  Gena Fray, PharmD PGY1 Pharmacy Resident   06/21/2022 8:16 AM  Please check AMION for all [place] phone numbers After 10:00 PM, call [place] [phone]

## 2022-06-21 NOTE — Progress Notes (Addendum)
ANTICOAGULATION CONSULT NOTE - Follow Up  Pharmacy Consult for Heparin Indication: afib  No Known Allergies  Patient Measurements: Height: '5\' 4"'$  (162.6 cm) Weight: 113.4 kg (250 lb) IBW/kg (Calculated) : 54.7 Heparin dosing wt: 82 kg   Vital Signs: Temp: 98.3 F (36.8 C) (11/23 0405) Temp Source: Oral (11/23 0405) BP: 151/128 (11/23 0405) Pulse Rate: 64 (11/23 0405)  Labs: Recent Labs    06/18/22 1056 06/19/22 0314 06/19/22 0350 06/20/22 0546 06/20/22 1123 06/20/22 2025 06/21/22 0632  HGB   < >  --  8.8* 8.1* 8.5*  8.8*  --  8.0*  HCT   < >  --  27.3* 25.1* 25.0*  26.0*  --  24.9*  PLT  --   --  399 369  --   --  360  HEPARINUNFRC  --   --   --  0.43  --  0.28* 0.49  CREATININE  --  3.75*  --  4.13*  --   --  4.04*   < > = values in this interval not displayed.     Estimated Creatinine Clearance: 15.1 mL/min (A) (by C-G formula based on SCr of 4.04 mg/dL (H)).   Assessment: 76 y/o F initially on heparin for rule out PE, with negative VQ scan.  Heparin was changed to subq for VTE prophylaxis.  Patient then evaluated by cards for peri-operative risk assessment.  Cards recommending treatment dose heparin continue for afib with plans for cath and possible BKA. CHA2DS2-VASc of 5. Ultimate plans for oral anticoagulation when procedures completed.  Heparin level 0.49 on drip rate 1650 units/hr therapeutic. Level therapeutic after increase in heparin. Hgb 8.0 and plt 360. No s/sx bleeding noted in chart.     Goal of Therapy:  Heparin level 0.3-0.7 units/ml Monitor platelets by anticoagulation protocol: Yes   Plan:  Continue heparin infusion at 1650 units/hr  Monitor daily heparin level and CBC Continue to monitor H&H     AM addendum: patient now comfort care. Will d/c heparin.   Gena Fray, PharmD PGY1 Pharmacy Resident   06/21/2022 8:01 AM  Please see amion for complete clinical pharmacist phone list

## 2022-06-21 NOTE — Progress Notes (Signed)
Received referral to assist pt with hospice. Per MD notes, pt does not want to pursue with BKA and has decided to transition to full comfort measures. D/C plan is home with hospice.  Met with pt and daughter Marita [place]). Discussed D/C plan. Pt lives alone. Daughter is a Marine scientist an she is going to take time off to stay with her mother. Discussed preference for a hospice agency. Daughter prefers to use [facility]. She reports that pt needs a hospital bed, 3-in-BSC and O2.   [place] at [phone]. Referral accepted by Amy, RN. Informed Amy that pt may be D/C tomorrow. Provided daughter's phone number [phone].

## 2022-06-21 NOTE — Progress Notes (Signed)
Palliative:  HPI: 76 y.o. female  with past medical history of diabetes, hypertension, hyperlipidemia, CKD stage 4, HFrEF EF 35-40%, anemia related to CKD, right transmetatarsal amputation admitted on 06/17/2022 with weakness and pain due to right foot ulcer with severe sepsis and acute osteomyelitis with necrotic tissue down to bone. Plans for transtibial amputation. Treating also for acute heart failure and possible pneumonia.     I met today with Courtney Schneider and her daughter, Courtney Schneider, at bedside. Courtney Schneider shares that she has decided not to pursue surgery and she wishes to go home with hospice. I reassured her that I had conversations with Dr. Sloan Leiter, PCCM, and cardiology yesterday and everyone agrees that her prognosis is poor and surgery is not expected to get her to a quality of life that she would desire. Courtney Schneider nods her head in understanding. She is steady and at peace with her decision. She is having her daughter help make lists of people she wants to notify, call, visit with her. She has also been making her funeral wishes known. They would like to go home with help from [place] and would like to go home tomorrow if possible. They do need equipment in the home.   Update: I returned to bedside when Courtney Schneider's brother and sister-in-law arrived to bedside to help explain the medical recommendations and expectations and how/why Courtney Schneider came to this decision. They listen tearfully and are supportive of her decision.   All questions/concerns addressed. Emotional support provided. Discussed with Dr. Sloan Leiter.   Exam: Alert, oriented. Appropriately tearful. No distress. Tolerating nasal cannula oxygen with no shortness of breath at rest. Abd soft.   Plan: - Home with hospice support.  - Recommend roxanol 0.25-0.5 mg po/SL every 4 hours PRN pain/shortness of breath to be available at home to ensure comfort.   [place], NP Palliative Medicine  Team Pager [phone] (Please see [email or web address] for schedule) Team Phone [phone]    Greater than 50%  of this time was spent counseling and coordinating care related to the above assessment and plan

## 2022-06-21 NOTE — Progress Notes (Signed)
Brief note: Long d/w daughter/patient at bedside.  Patient does not want to pursue BKA Has decided to transition to full comfort measures Will plan for home hospice Full note to follow.

## 2022-06-21 NOTE — Progress Notes (Signed)
PROGRESS NOTE        PATIENT DETAILS Name: Courtney Schneider Age: 76 y.o. Sex: female Date of Birth: 11-Jul-1946 Admit Date: 06/17/2022 Admitting Physician Orma Flaming, MD [id number], Loma Sousa, NP  Brief Summary: Patient is a 76 y.o.  female with history of stage IV CKD, HTN, DM-2, prior TMA-presenting with sepsis due to right foot ulcer with osteomyelitis.  Hospital course complicated by worsening hypoxemia due to new onset HFrEF.  See below for further details.  Significant events: 11/19>> admitted [facility]-sepsis-right foot ulcer/acute osteomyelitis 11/20>> worsening hypoxemia 11/23>> transitioned to comfort care-home hospice on discharge.  Significant studies: 11/19>> MRI right forefoot: Mid soft tissue ulceration-with underlying osteomyelitis of residual/fifth metatarsal bases.  No abscess. 11/20>> VQ scan: No PE. 11/20>> echo: EF 64-40%, grade 1 diastolic dysfunction.  Significant microbiology data: 11/19>> COVID/influenza PCR: Negative 11/19>> blood culture: 1/2 gram-positive cocci 11/19>> urine culture: E. coli  Procedures: 11/22>> RHC: Mean pulmonary artery pressure 30 mmHg consistent with mild pulmonary hypertension with phasic 62/15 mmHg.  Mean pulmonary wedge pressure 24 mmHg.  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 V wave in capillary wedge position averaging 40 mmHg. Mixed venous O2 saturation 53%. PAPI 4.36 consistent with normal RV function. Cardiac output 6.5 L/min with index 3.02 L/min/m.  Pulse oximetry arterial O2 saturation 93% with patient on 3 L/min by cannula.  Consults: PCCM Orthopedics Palliative care Cardiology  Subjective: No major issues overnight-stable on 2-3 L of oxygen.  Does not want to undergo BKA.  Extensive discussion at bedside with patient/daughter-both want to pursue comfort measures-and want to go home with hospice  care.  Objective: Vitals: Blood pressure (!) 164/80, pulse (!) 56, temperature 98 F (36.7 C), temperature source Oral, resp. rate 19, height '5\' 4"'$  (1.626 m), weight 113.4 kg, SpO2 99 %.   Exam: Gen Exam:Alert awake-not in any distress HEENT:atraumatic, normocephalic Chest: B/L clear to auscultation anteriorly CVS:S1S2 regular Abdomen:soft non tender, non distended Extremities:+ edema Neurology: Non focal Skin: no rash  Pertinent Labs/Radiology:    Latest Ref Rng & Units 06/21/2022    6:32 AM 06/20/2022   11:23 AM 06/20/2022    5:46 AM  CBC  WBC 4.0 - 10.5 K/uL 12.4   14.4   Hemoglobin 12.0 - 15.0 g/dL 8.0  8.8    8.5  8.1   Hematocrit 36.0 - 46.0 % 24.9  26.0    25.0  25.1   Platelets 150 - 400 K/uL 360   369     Lab Results  Component Value Date   NA 137 06/21/2022   K 4.0 06/21/2022   CL 106 06/21/2022   CO2 20 (L) 06/21/2022      Assessment/Plan: Sepsis secondary to right diabetic foot ulcer with underlying acute osteomyelitis Sepsis physiology improved 1/2 blood culture positive for gram-positive cocci-likely contamination Initially plans were for potential BKA but patient/daughter have elected to transition to comfort measures.   All antimicrobial therapy discontinued-discussed with Dr. Loma Sender plans for BKA.    Acute hypoxic respiratory failure due to acute on chronic diastolic/systolic heart failure Hypoxia better-stable on 2-3 L of oxygen  Since plans are for comfort measures-will leave on IV diuretics/beta-blocker/hydralazine/Imdur for now-mostly for comfort.   Not a candidate for ARB/Entresto given advanced CKD.  AKI on CKD stage IV Hemodynamically mediated/on diuretics Continue IV  diuretics for comfort.   Does not desire HD/RRT  New onset atrial fibrillation Rate controlled with metoprolol Anticoagulation discontinued-transitioned to comfort care.  HTN Stable Continue monitor/Imdur/hydralazine  DM-2 (A1c 6.5 on Sept 2023) CBGs stable with  Semglee 10 units daily and SSI Follow/optimize   Recent Labs    06/20/22 1534 06/20/22 2045 06/21/22 0822  GLUCAP 142* 143* 134*    History of right transmetatarsal amputation  Palliative care DNR in place Does not desire HD/RRT After extensive discussion-transition to full comfort measures on 11/23. Case management consulted to set up home hospice Likely home with hospice care 11/24  Morbid Obesity: Estimated body mass index is 42.91 kg/m as calculated from the following:   Height as of this encounter: '5\' 4"'$  (1.626 m).   Weight as of this encounter: 113.4 kg.   Code status:   Code Status: DNR   DVT Prophylaxis: IV Heparin   Family Communication: Daughter at bedside   Disposition Plan: Status is: Inpatient Remains inpatient appropriate because: Severity of illness   Planned Discharge Destination:Home with hospice 11/24   Diet: Diet Order             Diet Carb Modified Fluid consistency: Thin; Room service appropriate? Yes  Diet effective now                     Antimicrobial agents: Anti-infectives (From admission, onward)    Start     Dose/Rate Route Frequency Ordered Stop   06/20/22 0800  ceFAZolin (ANCEF) IVPB 2g/100 mL premix  Status:  Discontinued        2 g 200 mL/hr over 30 Minutes Intravenous On call to O.R. 06/20/22 0108 06/20/22 1145   06/19/22 1400  vancomycin (VANCOCIN) IVPB 1000 mg/200 mL premix  Status:  Discontinued        1,000 mg 200 mL/hr over 60 Minutes Intravenous Every 48 hours 06/17/22 1410 06/21/22 0859   06/18/22 1400  cefTRIAXone (ROCEPHIN) 2 g in sodium chloride 0.9 % 100 mL IVPB  Status:  Discontinued        2 g 200 mL/hr over 30 Minutes Intravenous Every 24 hours 06/17/22 1642 06/18/22 0932   06/18/22 1030  ceFEPIme (MAXIPIME) 2 g in sodium chloride 0.9 % 100 mL IVPB  Status:  Discontinued        2 g 200 mL/hr over 30 Minutes Intravenous Every 24 hours 06/18/22 0932 06/21/22 0859   06/18/22 0400  metroNIDAZOLE  (FLAGYL) IVPB 500 mg  Status:  Discontinued        500 mg 100 mL/hr over 60 Minutes Intravenous Every 12 hours 06/17/22 1635 06/21/22 0859   06/17/22 1630  metroNIDAZOLE (FLAGYL) IVPB 500 mg  Status:  Discontinued        500 mg 100 mL/hr over 60 Minutes Intravenous Every 12 hours 06/17/22 1623 06/17/22 1636   06/17/22 1400  vancomycin (VANCOREADY) IVPB 2000 mg/400 mL        2,000 mg 200 mL/hr over 120 Minutes Intravenous  Once 06/17/22 1345 06/17/22 1716   06/17/22 1345  vancomycin (VANCOCIN) IVPB 1000 mg/200 mL premix  Status:  Discontinued        1,000 mg 200 mL/hr over 60 Minutes Intravenous  Once 06/17/22 1333 06/17/22 1345   06/17/22 1345  cefTRIAXone (ROCEPHIN) 2 g in sodium chloride 0.9 % 100 mL IVPB        2 g 200 mL/hr over 30 Minutes Intravenous  Once 06/17/22 1333 06/17/22 1439   06/17/22 1345  metroNIDAZOLE (FLAGYL) tablet 500 mg        500 mg Oral  Once 06/17/22 1333 06/17/22 1513        MEDICATIONS: Scheduled Meds:  Chlorhexidine Gluconate Cloth  6 each Topical Q0600   hydrALAZINE  10 mg Oral Q8H   insulin aspart  0-9 Units Subcutaneous TID WC   insulin glargine-yfgn  10 Units Subcutaneous QHS   isosorbide mononitrate  15 mg Oral Daily   metoprolol succinate  50 mg Oral Daily   sodium bicarbonate  1,300 mg Oral QID   sodium chloride flush  3 mL Intravenous Q12H   sodium chloride flush  3 mL Intravenous Q12H   traZODone  50 mg Oral QHS   Continuous Infusions:  sodium chloride     furosemide 120 mg (06/21/22 0849)   PRN Meds:.sodium chloride, acetaminophen, HYDROcodone-acetaminophen, ondansetron **OR** ondansetron (ZOFRAN) IV, mouth rinse, sodium chloride flush   I have personally reviewed following labs and imaging studies  LABORATORY DATA: CBC: Recent Labs  Lab 06/17/22 1314 06/18/22 0105 06/19/22 0350 06/20/22 0546 06/20/22 1123 06/21/22 0632  WBC 20.9* 22.6* 18.6* 14.4*  --  12.4*  NEUTROABS 18.0*  --   --   --   --   --   HGB 9.9* 9.1* 8.8*  8.1* 8.5*  8.8* 8.0*  HCT 31.3* 29.2* 27.3* 25.1* 25.0*  26.0* 24.9*  MCV 96.3 98.0 95.5 96.2  --  96.1  PLT 376 353 399 369  --  360     Basic Metabolic Panel: Recent Labs  Lab 06/17/22 1314 06/18/22 0105 06/19/22 0314 06/20/22 0546 06/20/22 1123 06/21/22 0632  NA 134* 134* 137 139 139  138 137  K 4.7 4.3 5.0 4.6 4.3  4.4 4.0  CL 105 104 103 105  --  106  CO2 16* 17* 14* 20*  --  20*  GLUCOSE 168* 134* 149* 111*  --  119*  BUN 54* 51* 59* 67*  --  73*  CREATININE 3.81* 3.62* 3.75* 4.13*  --  4.04*  CALCIUM 8.8* 8.6* 8.6* 8.5*  --  8.2*     GFR: Estimated Creatinine Clearance: 15.1 mL/min (A) (by C-G formula based on SCr of 4.04 mg/dL (H)).  Liver Function Tests: Recent Labs  Lab 06/17/22 1314 06/18/22 0105  AST 16 16  ALT 8 7  ALKPHOS 87 71  BILITOT 0.6 0.4  PROT 7.3 5.9*  ALBUMIN 3.2* 2.3*    No results for input(s): "LIPASE", "AMYLASE" in the last 168 hours. No results for input(s): "AMMONIA" in the last 168 hours.  Coagulation Profile: Recent Labs  Lab 06/17/22 1314  INR 1.2     Cardiac Enzymes: No results for input(s): "CKTOTAL", "CKMB", "CKMBINDEX", "TROPONINI" in the last 168 hours.  BNP (last 3 results) No results for input(s): "PROBNP" in the last 8760 hours.  Lipid Profile: No results for input(s): "CHOL", "HDL", "LDLCALC", "TRIG", "CHOLHDL", "LDLDIRECT" in the last 72 hours.  Thyroid Function Tests: No results for input(s): "TSH", "T4TOTAL", "FREET4", "T3FREE", "THYROIDAB" in the last 72 hours.  Anemia Panel: No results for input(s): "VITAMINB12", "FOLATE", "FERRITIN", "TIBC", "IRON", "RETICCTPCT" in the last 72 hours.  Urine analysis:    Component Value Date/Time   COLORURINE YELLOW 06/18/2022 0656   APPEARANCEUR CLOUDY (A) 06/18/2022 0656   LABSPEC 1.006 06/18/2022 0656   PHURINE 5.0 06/18/2022 0656   GLUCOSEU NEGATIVE 06/18/2022 0656   HGBUR SMALL (A) 06/18/2022 [place] NEGATIVE 06/18/2022 [place] 06/18/2022 6160  PROTEINUR 30 (A) 06/18/2022 0656   NITRITE NEGATIVE 06/18/2022 0656   LEUKOCYTESUR TRACE (A) 06/18/2022 0656    Sepsis Labs: Lactic Acid, Venous    Component Value Date/Time   LATICACIDVEN 1.6 06/17/2022 1448    MICROBIOLOGY: Recent Results (from the past 240 hour(s))  Resp Panel by RT-PCR (Flu A&B, Covid) Anterior Nasal Swab     Status: None   Collection Time: 06/17/22  1:07 PM   Specimen: Anterior Nasal Swab  Result Value Ref Range Status   SARS Coronavirus 2 by RT PCR NEGATIVE NEGATIVE Final    Comment: (NOTE) SARS-CoV-2 target nucleic acids are NOT DETECTED.  The SARS-CoV-2 RNA is generally detectable in upper respiratory specimens during the acute phase of infection. The lowest concentration of SARS-CoV-2 viral copies this assay can detect is 138 copies/mL. A negative result does not preclude SARS-Cov-2 infection and should not be used as the sole basis for treatment or other patient management decisions. A negative result may occur with  improper specimen collection/handling, submission of specimen other than nasopharyngeal swab, presence of viral mutation(s) within the areas targeted by this assay, and inadequate number of viral copies(<138 copies/mL). A negative result must be combined with clinical observations, patient history, and epidemiological information. The expected result is Negative.  Fact Sheet for Patients:  EntrepreneurPulse.com.au  Fact Sheet for Healthcare Providers:  IncredibleEmployment.be  This test is no t yet approved or cleared by the [place] FDA and  has been authorized for detection and/or diagnosis of SARS-CoV-2 by FDA under an Emergency Use Authorization (EUA). This EUA will remain  in effect (meaning this test can be used) for the duration of the COVID-19 declaration under Section 564(b)(1) of the Act, 21 U.S.C.section 360bbb-3(b)(1), unless the authorization is  terminated  or revoked sooner.       Influenza A by PCR NEGATIVE NEGATIVE Final   Influenza B by PCR NEGATIVE NEGATIVE Final    Comment: (NOTE) The Xpert Xpress SARS-CoV-2/FLU/RSV plus assay is intended as an aid in the diagnosis of influenza from Nasopharyngeal swab specimens and should not be used as a sole basis for treatment. Nasal washings and aspirates are unacceptable for Xpert Xpress SARS-CoV-2/FLU/RSV testing.  Fact Sheet for Patients: EntrepreneurPulse.com.au  Fact Sheet for Healthcare Providers: IncredibleEmployment.be  This test is not yet approved or cleared by the [place] FDA and has been authorized for detection and/or diagnosis of SARS-CoV-2 by FDA under an Emergency Use Authorization (EUA). This EUA will remain in effect (meaning this test can be used) for the duration of the COVID-19 declaration under Section 564(b)(1) of the Act, 21 U.S.C. section 360bbb-3(b)(1), unless the authorization is terminated or revoked.  Performed at [facility], [address], [place], [place] [address]   Culture, blood (Routine x 2)     Status: None (Preliminary result)   Collection Time: 06/17/22  1:14 PM   Specimen: Right Antecubital; Blood  Result Value Ref Range Status   Specimen Description   Final    RIGHT ANTECUBITAL BOTTLES DRAWN AEROBIC AND ANAEROBIC Performed at [facility], [address], [place], [place] [address]    Special Requests   Final    Blood Culture adequate volume Performed at [place] [place], [place] [address]    Culture  Setup Time   Final    GRAM POSITIVE COCCI WBC PRESENT,BOTH PMN AND MONONUCLEAR RESULT CALLED TO, READ BACK BY AND VERIFIED WITH: called J. Teffer RN at [place]  on 06/19/22 R.Byrd CRITICAL  RESULT CALLED TO, READ BACK BY AND VERIFIED WITH: PHARMD JEREMY F. [id number] FCP ANAEROBIC BOTTLE ONLY Performed at [facility], [place] [address] [place], [place] [address]     Culture GRAM POSITIVE COCCI  Final   Report Status PENDING  Incomplete  Culture, blood (Routine x 2)     Status: None (Preliminary result)   Collection Time: 06/17/22  1:14 PM   Specimen: Left Antecubital; Blood  Result Value Ref Range Status   Specimen Description   Final    LEFT ANTECUBITAL BOTTLES DRAWN AEROBIC AND ANAEROBIC   Special Requests   Final    Blood Culture results may not be optimal due to an excessive volume of blood received in culture bottles   Culture   Final    NO GROWTH 4 DAYS Performed at [facility], [address] [place], [place] [address]    Report Status PENDING  Incomplete  Blood Culture ID Panel (Reflexed)     Status: None   Collection Time: 06/17/22  1:14 PM  Result Value Ref Range Status   Enterococcus faecalis NOT DETECTED NOT DETECTED Final   Enterococcus Faecium NOT DETECTED NOT DETECTED Final   Listeria monocytogenes NOT DETECTED NOT DETECTED Final   Staphylococcus species NOT DETECTED NOT DETECTED Final   Staphylococcus aureus (BCID) NOT DETECTED NOT DETECTED Final   Staphylococcus epidermidis NOT DETECTED NOT DETECTED Final   Staphylococcus lugdunensis NOT DETECTED NOT DETECTED Final   Streptococcus species NOT DETECTED NOT DETECTED Final   Streptococcus agalactiae NOT DETECTED NOT DETECTED Final   Streptococcus pneumoniae NOT DETECTED NOT DETECTED Final   Streptococcus pyogenes NOT DETECTED NOT DETECTED Final   A.calcoaceticus-baumannii NOT DETECTED NOT DETECTED Final   Bacteroides fragilis NOT DETECTED NOT DETECTED Final   Enterobacterales NOT DETECTED NOT DETECTED Final   Enterobacter cloacae complex NOT DETECTED NOT DETECTED Final   Escherichia coli NOT DETECTED NOT DETECTED Final   Klebsiella aerogenes NOT DETECTED NOT DETECTED Final   Klebsiella oxytoca NOT DETECTED NOT DETECTED Final   Klebsiella pneumoniae NOT DETECTED NOT DETECTED Final   Proteus species NOT DETECTED NOT DETECTED Final   Salmonella species NOT DETECTED NOT  DETECTED Final   Serratia marcescens NOT DETECTED NOT DETECTED Final   Haemophilus influenzae NOT DETECTED NOT DETECTED Final   Neisseria meningitidis NOT DETECTED NOT DETECTED Final   Pseudomonas aeruginosa NOT DETECTED NOT DETECTED Final   Stenotrophomonas maltophilia NOT DETECTED NOT DETECTED Final   Candida albicans NOT DETECTED NOT DETECTED Final   Candida auris NOT DETECTED NOT DETECTED Final   Candida glabrata NOT DETECTED NOT DETECTED Final   Candida krusei NOT DETECTED NOT DETECTED Final   Candida parapsilosis NOT DETECTED NOT DETECTED Final   Candida tropicalis NOT DETECTED NOT DETECTED Final   Cryptococcus neoformans/gattii NOT DETECTED NOT DETECTED Final    Comment: Performed at [facility], [place], [place], [place] [address]  Urine Culture     Status: Abnormal   Collection Time: 06/17/22  8:38 PM   Specimen: In/Out Cath Urine  Result Value Ref Range Status   Specimen Description IN/OUT CATH URINE  Final   Special Requests   Final    NONE Performed at [facility], [place] [address], [place], [place] [address]    Culture 10,000 COLONIES/mL ESCHERICHIA COLI (A)  Final   Report Status 06/20/2022 FINAL  Final   Organism ID, Bacteria ESCHERICHIA COLI (A)  Final      Susceptibility   Escherichia  coli - MIC*    AMPICILLIN <=2 SENSITIVE Sensitive     CEFAZOLIN <=4 SENSITIVE Sensitive     CEFEPIME <=0.12 SENSITIVE Sensitive     CEFTRIAXONE <=0.25 SENSITIVE Sensitive     CIPROFLOXACIN <=0.25 SENSITIVE Sensitive     GENTAMICIN <=1 SENSITIVE Sensitive     IMIPENEM <=0.25 SENSITIVE Sensitive     NITROFURANTOIN <=16 SENSITIVE Sensitive     TRIMETH/SULFA <=20 SENSITIVE Sensitive     AMPICILLIN/SULBACTAM <=2 SENSITIVE Sensitive     PIP/TAZO <=4 SENSITIVE Sensitive     * 10,000 COLONIES/mL ESCHERICHIA COLI  MRSA Next Gen by PCR, Nasal     Status: Abnormal   Collection Time: 06/19/22  5:23 PM   Specimen: Nasal Mucosa; Nasal Swab  Result Value Ref Range Status    MRSA by PCR Next Gen DETECTED (A) NOT DETECTED Final    Comment: RESULT CALLED TO, READ BACK BY AND VERIFIED WITH: RN A. LOWE [id number] '@[id number]'$  FH (NOTE) The GeneXpert MRSA Assay (FDA approved for NASAL specimens only), is one component of a comprehensive MRSA colonization surveillance program. It is not intended to diagnose MRSA infection nor to guide or monitor treatment for MRSA infections. Test performance is not FDA approved in patients less than 31 years old. Performed at [facility], [place] [address], [place], [place] [address]     RADIOLOGY STUDIES/RESULTS: CARDIAC CATHETERIZATION  Result Date: 06/20/2022 FINDINGS: Mean pulmonary artery pressure 30 mmHg consistent with mild pulmonary hypertension with phasic 62/15 mmHg.  Mean pulmonary wedge pressure 24 mmHg.  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 V wave in capillary wedge position averaging 40 mmHg. Mixed venous O2 saturation 53%. PAPI 4.36 consistent with normal RV function. Cardiac output 6.5 L/min with index 3.02 L/min/m.  Pulse oximetry arterial O2 saturation 93% with patient on 3 L/min by cannula. CONCLUSIONS: Findings are consistent left heart failure with pulmonary capillary wedge mean pressure 24 mmHg. Normal cardiac output. Mild pulmonary hypertension Mild elevation in right atrial pressure with normal right heart function based on PAPI.     LOS: 4 days   Oren Binet, MD  [facility]    To contact the attending provider between 7A-7P or the covering provider during after hours 7P-7A, please log into the web site [email or web address] and access using universal [place] password for that web site. If you do not have the password, please call the hospital operator.  06/21/2022, 11:44 AM

## 2022-06-22 DIAGNOSIS — I10 Essential (primary) hypertension: Secondary | ICD-10-CM | POA: Diagnosis not present

## 2022-06-22 DIAGNOSIS — I480 Paroxysmal atrial fibrillation: Secondary | ICD-10-CM | POA: Diagnosis not present

## 2022-06-22 DIAGNOSIS — I5032 Chronic diastolic (congestive) heart failure: Secondary | ICD-10-CM | POA: Diagnosis not present

## 2022-06-22 DIAGNOSIS — I5043 Acute on chronic combined systolic (congestive) and diastolic (congestive) heart failure: Secondary | ICD-10-CM | POA: Diagnosis not present

## 2022-06-22 LAB — CULTURE, BLOOD (ROUTINE X 2): Culture: NO GROWTH

## 2022-06-22 LAB — GLUCOSE, CAPILLARY
Glucose-Capillary: 165 mg/dL — ABNORMAL HIGH (ref 70–99)
Glucose-Capillary: 146 mg/dL — ABNORMAL HIGH (ref 70–99)

## 2022-06-22 MED ORDER — ACETAMINOPHEN 325 MG PO TABS: 650.0000 mg | ORAL_TABLET | ORAL | Status: AC | PRN

## 2022-06-22 MED ORDER — FUROSEMIDE 80 MG PO TABS: 160.0000 mg | ORAL_TABLET | Freq: Two times a day (BID) | ORAL | 0 refills | Status: AC

## 2022-06-22 MED ORDER — METOPROLOL SUCCINATE ER 50 MG PO TB24: 50.0000 mg | ORAL_TABLET | Freq: Every day | ORAL | 0 refills | Status: AC

## 2022-06-22 MED ORDER — LORAZEPAM 1 MG PO TABS: 1.0000 mg | ORAL_TABLET | Freq: Three times a day (TID) | ORAL | 0 refills | Status: AC | PRN

## 2022-06-22 MED ORDER — TRAZODONE HCL 100 MG PO TABS: 50.0000 mg | ORAL_TABLET | Freq: Every day | ORAL | 0 refills | Status: AC

## 2022-06-22 MED ORDER — MORPHINE SULFATE (CONCENTRATE) 20 MG/ML PO SOLN: 5.0000 mg | ORAL | 0 refills | Status: AC | PRN

## 2022-06-22 MED ORDER — HYDRALAZINE HCL 10 MG PO TABS: 10.0000 mg | ORAL_TABLET | Freq: Three times a day (TID) | ORAL | 0 refills | Status: AC

## 2022-06-22 MED ORDER — ISOSORBIDE MONONITRATE ER 30 MG PO TB24: 15.0000 mg | ORAL_TABLET | Freq: Every day | ORAL | 0 refills | Status: AC

## 2022-06-22 MED FILL — Verapamil HCl IV Soln 2.5 MG/ML: INTRAVENOUS | Qty: 2 | Status: AC

## 2022-06-22 NOTE — TOC Transition Note (Signed)
Transition of Care [facility]) - CM/SW Discharge Note   Patient Details  Name: Courtney Schneider MRN: [id number] Date of Birth: 1946-03-08  Transition of Care [facility]) CM/SW Contact:  Pollie Friar, RN Phone Number: 06/22/2022, 1:43 PM   Clinical Narrative:    Pt is discharging home with hospice services to begin with [facility]). Awaiting delivery of DME to the home and then ambulance transport home can be arranged. Daughter and bedside RN aware.  Discharge packet is at the desk.   Final next level of care: Home w Hospice Care Barriers to Discharge: No Barriers Identified   Patient Goals and CMS Choice   CMS Medicare.gov Compare Post Acute Care list provided to:: Patient Represenative (must comment) Choice offered to / list presented to : Adult Children  Discharge Placement                       Discharge Plan and Services                            [facility]: [facility]        Social Determinants of Health (SDOH) Interventions     Readmission Risk Interventions     No data to display

## 2022-06-22 NOTE — Discharge Summary (Signed)
PATIENT DETAILS Name: Courtney Schneider Age: 76 y.o. Sex: female Date of Birth: September 05, 1945 MRN: [id number]. Admitting Physician: Orma Flaming, MD WUJ:[id number], Loma Sousa, NP  Admit Date: 06/17/2022 Discharge date: 06/22/2022  Recommendations for Outpatient Follow-up:  Optimize comfort measures Home with hospice care  Admitted From:  Home  Disposition: Home with hospice   Discharge Condition: poor  CODE STATUS:   Code Status: DNR   Diet recommendation:  Diet Order             Diet general           Diet Carb Modified Fluid consistency: Thin; Room service appropriate? Yes  Diet effective now                    Brief Summary: Patient is a 76 y.o.  female with history of stage IV CKD, HTN, DM-2, prior TMA-presenting with sepsis due to right foot ulcer with osteomyelitis.  Hospital course complicated by worsening hypoxemia due to new onset HFrEF.  See below for further details.   Significant events: 11/19>> admitted [facility]-sepsis-right foot ulcer/acute osteomyelitis 11/20>> worsening hypoxemia 11/23>> transitioned to comfort care-home hospice on discharge.   Significant studies: 11/19>> MRI right forefoot: Mid soft tissue ulceration-with underlying osteomyelitis of residual/fifth metatarsal bases.  No abscess. 11/20>> VQ scan: No PE. 11/20>> echo: EF 78-29%, grade 1 diastolic dysfunction.   Significant microbiology data: 11/19>> COVID/influenza PCR: Negative 11/19>> blood culture: 1/2 gram-positive cocci 11/19>> urine culture: E. coli   Procedures: 11/22>> RHC: Mean pulmonary artery pressure 30 mmHg consistent with mild pulmonary hypertension with phasic 62/15 mmHg.  Mean pulmonary wedge pressure 24 mmHg.  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 V wave in capillary wedge position averaging 40 mmHg. Mixed venous O2 saturation 53%. PAPI 4.36 consistent with normal RV  function. Cardiac output 6.5 L/min with index 3.02 L/min/m.  Pulse oximetry arterial O2 saturation 93% with patient on 3 L/min by cannula.   Consults: PCCM Orthopedics Palliative care Cardiology  Brief Hospital Course: Sepsis secondary to right diabetic foot ulcer with underlying acute osteomyelitis Sepsis physiology improved 1/2 blood culture positive for gram-positive cocci-likely contamination Initially plans were for potential BKA but patient/daughter have elected to transition to comfort measures.   All antimicrobial therapy discontinued-discussed with Dr. Loma Sender plans for BKA.     Acute hypoxic respiratory failure due to acute on chronic diastolic/systolic heart failure Hypoxia better-stable on 2-3 L of oxygen  Volume status significantly better with IV Lasix-will be continued on oral Lasix for comfort. Continue on beta-blocker/Imdur/hydralazine-mostly for comfort. These medications can be discontinued as clinical situation requires/comfort care requires.   AKI on CKD stage IV Hemodynamically mediated/on diuretics Does not desire HD/RRT Volume status much better after IV Lasix-will be continued on Lasix for comfort.   New onset atrial fibrillation Rate controlled with metoprolol Anticoagulation discontinued-transitioned to comfort care.   HTN Stable Continue monitor/Imdur/hydralazine for now-can be discontinued depending on clinical situation/comfort care status.   DM-2 (A1c 6.5 on Sept 2023) Managed with Semglee 10 units daily and SSI Discussed with patient/daughter at bedside on 11/24-CBGs stable-plans are to stop CBG monitoring/all insulin therapy on discharge.  Patient/daughter agreeable with this plan.  History of right transmetatarsal amputation   Palliative care DNR in place Does not desire HD/RRT After extensive discussion-transition to full comfort measures on 11/23. Home with hospice care   Morbid Obesity: Estimated body mass index is 42.91 kg/m as  calculated from  the following:   Height as of this encounter: '5\' 4"'$  (1.626 m).   Weight as of this encounter: 113.4 kg.    Discharge Diagnoses:  Principal Problem:   Chronic diastolic CHF (congestive heart failure) (HCC) Active Problems:   Sepsis secondary to right foot ulcer   Acute respiratory failure with hypoxia (HCC)   Acute renal failure superimposed on stage 4 chronic kidney disease (HCC)   Insulin dependent type 2 diabetes mellitus (HCC)   HTN (hypertension)   HLD (hyperlipidemia)   Right foot ulcer (HCC)   Subacute osteomyelitis, right ankle and foot (HCC)   Acute on chronic combined systolic and diastolic CHF (congestive heart failure) (HCC)   Paroxysmal atrial fibrillation ([place])   Goals of care, counseling/discussion   Palliative care by specialist   Discharge Instructions:  Activity:  As tolerated with Full fall precautions use walker/cane & assistance as needed   Discharge Instructions     Diet general   Complete by: As directed    Discharge instructions   Complete by: As directed    Follow with Primary MD  Pablo Lawrence, NP in 1-2 weeks  Get Medicines reviewed and adjusted: Please take all your medications with you for your next visit with your Primary MD  Laboratory/radiological data: Please request your Primary MD to go over all hospital tests and procedure/radiological results at the follow up, please ask your Primary MD to get all Hospital records sent to his/her office.  In some cases, they will be blood work, cultures and biopsy results pending at the time of your discharge. Please request that your primary care M.D. follows up on these results.  Also Note the following: If you experience worsening of your admission symptoms, develop shortness of breath, life threatening emergency, suicidal or homicidal thoughts you must seek medical attention immediately by calling 911 or calling your MD immediately  if symptoms less severe.  You must read  complete instructions/literature along with all the possible adverse reactions/side effects for all the Medicines you take and that have been prescribed to you. Take any new Medicines after you have completely understood and accpet all the possible adverse reactions/side effects.   Do not drive when taking Pain medications or sleeping medications (Benzodaizepines)  Do not take more than prescribed Pain, Sleep and Anxiety Medications. It is not advisable to combine anxiety,sleep and pain medications without talking with your primary care practitioner  Special Instructions: If you have smoked or chewed Tobacco  in the last 2 yrs please stop smoking, stop any regular Alcohol  and or any Recreational drug use.  Wear Seat belts while driving.  Please note: You were cared for by a hospitalist during your hospital stay. Once you are discharged, your primary care physician will handle any further medical issues. Please note that NO REFILLS for any discharge medications will be authorized once you are discharged, as it is imperative that you return to your primary care physician (or establish a relationship with a primary care physician if you do not have one) for your post hospital discharge needs so that they can reassess your need for medications and monitor your lab values.   Discharge wound care:   Complete by: As directed    Wet-to-dry dressing to right foot ulcer as needed.   Increase activity slowly   Complete by: As directed       Allergies as of 06/22/2022   No Known Allergies      Medication List     STOP taking  these medications    albuterol 108 (90 Base) MCG/ACT inhaler Commonly known as: VENTOLIN HFA   amLODipine-valsartan 10-160 MG tablet Commonly known as: EXFORGE   aspirin EC 81 MG tablet   atorvastatin 10 MG tablet Commonly known as: LIPITOR   cholecalciferol 25 MCG (1000 UNIT) tablet Commonly known as: VITAMIN D3   diphenhydramine-acetaminophen 25-500 MG Tabs  tablet Commonly known as: TYLENOL PM   insulin glargine 100 UNIT/ML injection Commonly known as: LANTUS   metoprolol tartrate 50 MG tablet Commonly known as: LOPRESSOR   PRESERVISION AREDS PO   sodium bicarbonate 650 MG tablet       TAKE these medications    acetaminophen 325 MG tablet Commonly known as: TYLENOL Take 2 tablets (650 mg total) by mouth every 4 (four) hours as needed for headache or mild pain. What changed:  medication strength how much to take when to take this reasons to take this   furosemide 80 MG tablet Commonly known as: Lasix Take 2 tablets (160 mg total) by mouth 2 (two) times daily. What changed:  medication strength how much to take when to take this   hydrALAZINE 10 MG tablet Commonly known as: APRESOLINE Take 1 tablet (10 mg total) by mouth every 8 (eight) hours.   isosorbide mononitrate 30 MG 24 hr tablet Commonly known as: IMDUR Take 0.5 tablets (15 mg total) by mouth daily. Start taking on: June 23, 2022   LORazepam 1 MG tablet Commonly known as: Ativan Take 1 tablet (1 mg total) by mouth every 8 (eight) hours as needed for anxiety, sedation or sleep.   metoprolol succinate 50 MG 24 hr tablet Commonly known as: TOPROL-XL Take 1 tablet (50 mg total) by mouth daily. Take with or immediately following a meal. Start taking on: June 23, 2022   morphine 20 MG/ML concentrated solution Commonly known as: ROXANOL Take 0.25 mLs (5 mg total) by mouth every 4 (four) hours as needed for severe pain, shortness of breath or anxiety.   traZODone 100 MG tablet Commonly known as: DESYREL Take 0.5 tablets (50 mg total) by mouth at bedtime.               Discharge Care Instructions  (From admission, onward)           Start     Ordered   06/22/22 0000  Discharge wound care:       Comments: Wet-to-dry dressing to right foot ulcer as needed.   06/22/22 [place] Follow up.   Why: [facility] will [facility] health [facility]. Contact information: [address] [place] [address] [phone]         Pablo Lawrence, NP Follow up.   Specialty: Adult Health Nurse Practitioner Why: As needed Contact information: [address] [place] [address] [phone]                No Known Allergies   Other Procedures/Studies: CARDIAC CATHETERIZATION  Result Date: 06/20/2022 FINDINGS: Mean pulmonary artery pressure 30 mmHg consistent with mild pulmonary hypertension with phasic 62/15 mmHg.  Mean pulmonary wedge pressure 24 mmHg.  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 V wave in capillary wedge position averaging 40 mmHg. Mixed venous O2 saturation 53%. PAPI 4.36 consistent with normal RV function. Cardiac output 6.5  L/min with index 3.02 L/min/m.  Pulse oximetry arterial O2 saturation 93% with patient on 3 L/min by cannula. CONCLUSIONS: Findings are consistent left heart failure with pulmonary capillary wedge mean pressure 24 mmHg. Normal cardiac output. Mild pulmonary hypertension Mild elevation in right atrial pressure with normal right heart function based on PAPI.   DG Chest Port 1 View  Result Date: 06/19/2022 CLINICAL DATA:  Dyspnea EXAM: PORTABLE CHEST 1 VIEW COMPARISON:  06/17/2022 chest radiograph. FINDINGS: Stable cardiomediastinal silhouette with mild cardiomegaly. No pneumothorax. No pleural effusion. Diffuse hazy patchy parahilar lung opacities, most prominent in the upper lungs, worsened. IMPRESSION: Stable mild cardiomegaly. Worsened diffuse hazy patchy parahilar lung opacities, most prominent in the upper lungs, favor pulmonary edema. Electronically Signed   By: Ilona Sorrel M.D.   On: 06/19/2022 08:31   ECHOCARDIOGRAM COMPLETE  Result Date: 06/18/2022    ECHOCARDIOGRAM REPORT   Patient Name:   PRAPTI GRUSSING Date of Exam: 06/18/2022 Medical Rec #:  [id number]          Height:       64.0 in Accession #:    [id number]         Weight:       250.0 lb Date of Birth:  Dec 15, 1945         BSA:          2.151 m Patient Age:    82 years           BP:           139/62 mmHg Patient Gender: F                  HR:           75 bpm. Exam Location:  Inpatient Procedure: 2D Echo, Cardiac Doppler, Color Doppler and Intracardiac            Opacification Agent Indications:    CHF-Acute Diastolic Y50.35  History:        Patient has no prior history of Echocardiogram examinations.                 CHF, Sepsis; Risk Factors:Hypertension, Dyslipidemia, Diabetes                 and Non-Smoker.  Sonographer:    Greer Pickerel Referring Phys: [id number] Orma Flaming  Sonographer Comments: Technically difficult study due to poor echo windows. Image acquisition challenging due to COPD and Image acquisition challenging due to patient body habitus. IMPRESSIONS  1. Left ventricular ejection fraction, by estimation, is 35 to 40%. The left ventricle has moderately decreased function. The left ventricle demonstrates regional wall motion abnormalities (see scoring diagram/findings for description). There is mild concentric left ventricular hypertrophy. Left ventricular diastolic parameters are consistent with Grade I diastolic dysfunction (impaired relaxation). Elevated left ventricular end-diastolic pressure.  2. Right ventricular systolic function is normal. The right ventricular size is normal. There is mildly elevated pulmonary artery systolic pressure.  3. Left atrial size was moderately dilated.  4. The mitral valve is normal in structure. Trivial mitral valve regurgitation. No evidence of mitral stenosis.  5. The aortic valve is tricuspid. There is mild calcification of the aortic valve. There is mild thickening of the aortic valve. Aortic valve regurgitation is not visualized. Mild aortic valve stenosis. Aortic valve area, by VTI measures 1.55  cm. Aortic valve mean gradient measures 12.0 mmHg. Aortic valve Vmax measures 2.20 m/s.  6. The inferior vena cava is dilated in size with >50%  respiratory variability, suggesting right atrial pressure of 8 mmHg. FINDINGS  Left Ventricle: Left ventricular ejection fraction, by estimation, is 35 to 40%. The left ventricle has moderately decreased function. The left ventricle demonstrates regional wall motion abnormalities. Definity contrast agent was given IV to delineate the left ventricular endocardial borders. The left ventricular internal cavity size was normal in size. There is mild concentric left ventricular hypertrophy. Left ventricular diastolic parameters are consistent with Grade I diastolic dysfunction (impaired relaxation). Elevated left ventricular end-diastolic pressure.  LV Wall Scoring: The mid and distal anterior wall, mid and distal anterior septum, and entire apex are hypokinetic. The antero-lateral wall, inferior wall, posterior wall, basal anteroseptal segment, mid inferoseptal segment, basal anterior segment, and basal inferoseptal segment are normal. Right Ventricle: The right ventricular size is normal. No increase in right ventricular wall thickness. Right ventricular systolic function is normal. There is mildly elevated pulmonary artery systolic pressure. The tricuspid regurgitant velocity is 2.65  m/s, and with an assumed right atrial pressure of 8 mmHg, the estimated right ventricular systolic pressure is 07.3 mmHg. Left Atrium: Left atrial size was moderately dilated. Right Atrium: Right atrial size was normal in size. Pericardium: There is no evidence of pericardial effusion. Mitral Valve: The mitral valve is normal in structure. Trivial mitral valve regurgitation. No evidence of mitral valve stenosis. Tricuspid Valve: The tricuspid valve is normal in structure. Tricuspid valve regurgitation is trivial. No evidence of tricuspid stenosis. Aortic Valve: The aortic valve is tricuspid.  There is mild calcification of the aortic valve. There is mild thickening of the aortic valve. Aortic valve regurgitation is not visualized. Mild aortic stenosis is present. Aortic valve mean gradient measures  12.0 mmHg. Aortic valve peak gradient measures 19.4 mmHg. Aortic valve area, by VTI measures 1.55 cm. Pulmonic Valve: The pulmonic valve was normal in structure. Pulmonic valve regurgitation is not visualized. No evidence of pulmonic stenosis. Aorta: The aortic root is normal in size and structure. Venous: The inferior vena cava is dilated in size with greater than 50% respiratory variability, suggesting right atrial pressure of 8 mmHg. IAS/Shunts: No atrial level shunt detected by color flow Doppler.  LEFT VENTRICLE PLAX 2D LVIDd:         4.00 cm      Diastology LVIDs:         3.30 cm      LV e' medial:    6.09 cm/s LV PW:         1.20 cm      LV E/e' medial:  17.6 LV IVS:        1.10 cm      LV e' lateral:   3.92 cm/s LVOT diam:     1.90 cm      LV E/e' lateral: 27.3 LV SV:         76 LV SV Index:   35 LVOT Area:     2.84 cm  LV Volumes (MOD) LV vol d, MOD A2C: 146.0 ml LV vol d, MOD A4C: 162.0 ml LV vol s, MOD A2C: 41.4 ml LV vol s, MOD A4C: 110.0 ml LV SV MOD A2C:     104.6 ml LV SV MOD A4C:     162.0 ml LV SV MOD BP:      85.0 ml RIGHT VENTRICLE RV S prime:     9.79 cm/s TAPSE (M-mode): 2.0 cm LEFT ATRIUM           Index        RIGHT ATRIUM  Index LA diam:      3.90 cm 1.81 cm/m   RA Area:     19.90 cm LA Vol (A2C): 82.3 ml 38.26 ml/m  RA Volume:   61.10 ml  28.41 ml/m LA Vol (A4C): 42.6 ml 19.80 ml/m  AORTIC VALVE AV Area (Vmax):    1.53 cm AV Area (Vmean):   1.49 cm AV Area (VTI):     1.55 cm AV Vmax:           220.50 cm/s AV Vmean:          159.000 cm/s AV VTI:            0.489 m AV Peak Grad:      19.4 mmHg AV Mean Grad:      12.0 mmHg LVOT Vmax:         119.00 cm/s LVOT Vmean:        83.500 cm/s LVOT VTI:          0.267 m LVOT/AV VTI ratio: 0.55  AORTA Ao Root diam: 3.30 cm Ao Asc  diam:  3.10 cm MITRAL VALVE                TRICUSPID VALVE MV Area (PHT): 3.21 cm     TR Peak grad:   28.1 mmHg MV Decel Time: 236 msec     TR Vmax:        265.00 cm/s MV E velocity: 107.00 cm/s                             SHUNTS                             Systemic VTI:  0.27 m                             Systemic Diam: 1.90 cm Skeet Latch MD Electronically signed by Skeet Latch MD Signature Date/Time: 06/18/2022/6:03:27 PM    Final    VAS [place] LOWER EXTREMITY VENOUS (DVT)  Result Date: 06/18/2022  Lower Venous DVT Study Patient Name:  KEARSTON PUTMAN  Date of Exam:   06/18/2022 Medical Rec #: [id number]           Accession #:    [id number] Date of Birth: [birth year]          Patient Gender: F Patient Age:   80 years Exam Location:  [facility] Procedure:      VAS [place] LOWER EXTREMITY VENOUS (DVT) Referring Phys: DAWOOD ELGERGAWY --------------------------------------------------------------------------------  Indications: Pain, and Edema.  Limitations: Body habitus, poor ultrasound/tissue interface and pain intolerance. Comparison Study: No previous exams Performing Technologist: Jody Hill RVT, RDMS  Examination Guidelines: A complete evaluation includes B-mode imaging, spectral Doppler, color Doppler, and power Doppler as needed of all accessible portions of each vessel. Bilateral testing is considered an integral part of a complete examination. Limited examinations for reoccurring indications may be performed as noted. The reflux portion of the exam is performed with the patient in reverse Trendelenburg.  +--------+---------------+---------+-----------+----------+--------------------+ RIGHT   CompressibilityPhasicitySpontaneityPropertiesThrombus Aging       +--------+---------------+---------+-----------+----------+--------------------+ CFV     Full           Yes      Yes                                        +--------+---------------+---------+-----------+----------+--------------------+  SFJ     Full                                                              +--------+---------------+---------+-----------+----------+--------------------+ FV Prox Full           Yes      Yes                                       +--------+---------------+---------+-----------+----------+--------------------+ FV Mid  Full           Yes      Yes                                       +--------+---------------+---------+-----------+----------+--------------------+ FV                     Yes      Yes                  patent by            Distal                                               color/doppler        +--------+---------------+---------+-----------+----------+--------------------+ PFV     Full                                                              +--------+---------------+---------+-----------+----------+--------------------+ POP     Full                                                              +--------+---------------+---------+-----------+----------+--------------------+ PTV     Full                                         Not well visualized  +--------+---------------+---------+-----------+----------+--------------------+ PERO    Full                                         Not well visualized  +--------+---------------+---------+-----------+----------+--------------------+   +---------+---------------+---------+-----------+----------+-------------------+ LEFT     CompressibilityPhasicitySpontaneityPropertiesThrombus Aging      +---------+---------------+---------+-----------+----------+-------------------+ CFV      Full           Yes      Yes                                      +---------+---------------+---------+-----------+----------+-------------------+  SFJ      Full                                                              +---------+---------------+---------+-----------+----------+-------------------+ FV Prox  Full           Yes      Yes                                      +---------+---------------+---------+-----------+----------+-------------------+ FV Mid   Full           Yes      Yes                                      +---------+---------------+---------+-----------+----------+-------------------+ FV DistalFull           Yes      Yes                                      +---------+---------------+---------+-----------+----------+-------------------+ PFV      Full                                                             +---------+---------------+---------+-----------+----------+-------------------+ POP      Full           Yes      Yes                                      +---------+---------------+---------+-----------+----------+-------------------+ PTV      Full                                         Not well visualized +---------+---------------+---------+-----------+----------+-------------------+ PERO     Full                                         Not well visualized +---------+---------------+---------+-----------+----------+-------------------+     Summary: BILATERAL: - No evidence of deep vein thrombosis seen in the lower extremities in areas visualized, bilaterally. -No evidence of popliteal cyst, bilaterally.   *See table(s) above for measurements and observations. Electronically signed by Servando Snare MD on 06/18/2022 at 5:31:54 PM.    Final    NM Pulmonary Perfusion  Result Date: 06/18/2022 CLINICAL DATA:  76 year old female with shortness of breath. EXAM: NUCLEAR MEDICINE PERFUSION LUNG SCAN TECHNIQUE: Perfusion images were obtained in multiple projections after intravenous injection of radiopharmaceutical. Ventilation scans intentionally deferred if perfusion scan and chest x-ray adequate for interpretation during COVID 19 epidemic.  RADIOPHARMACEUTICALS:  4.1 mCi Tc-39mMAA IV COMPARISON:  Portable chest x-ray yesterday 1647 hours. FINDINGS:  Homogeneous perfusion radiotracer activity in both lungs when accounting for mediastinal - and on the lateral views upper extremity related photopenia. No convincing perfusion defect. IMPRESSION: Normal perfusion, no evidence of pulmonary embolus. Electronically Signed   By: Genevie Ann M.D.   On: 06/18/2022 10:16   MR FOOT RIGHT WO CONTRAST  Result Date: 06/18/2022 CLINICAL DATA:  Diabetic right foot ulcer. History of prior amputation. EXAM: MRI OF THE RIGHT FOREFOOT WITHOUT CONTRAST TECHNIQUE: Multiplanar, multisequence MR imaging of the right foot was performed. No intravenous contrast was administered. COMPARISON:  Right foot x-rays from same day. MRI right foot dated Dec 24, 2017. FINDINGS: Bones/Joint/Cartilage Prior transmetatarsal amputation. Abnormal marrow edema with early decreased T1 marrow signal involving the residual fourth and fifth metatarsal bases. No fracture or dislocation. Joint spaces are preserved. No joint effusion. Ligaments Medial and lateral ankle ligaments are grossly intact. Muscles and Tendons No tenosynovitis. Soft tissue Lateral plantar midfoot soft tissue ulceration adjacent to the residual fifth metatarsal base with subcutaneous emphysema but no fluid collection. Diffuse soft tissue swelling. No soft tissue mass. IMPRESSION: 1. Lateral plantar midfoot soft tissue ulceration adjacent to the residual fifth metatarsal base with underlying osteomyelitis of the residual fourth and fifth metatarsal bases. No abscess. Electronically Signed   By: Titus Dubin M.D.   On: 06/18/2022 07:53   DG CHEST PORT 1 VIEW  Result Date: 06/17/2022 CLINICAL DATA:  Acute respiratory failure.  Hypoxia. EXAM: PORTABLE CHEST 1 VIEW COMPARISON:  June 17, 2022 FINDINGS: Stable cardiomegaly. The hila and mediastinum are normal. Bilateral hazy opacities are identified, more focal in the  bases, right greater than left. No pneumothorax. No nodule or mass. IMPRESSION: 1. Cardiomegaly. 2. Bilateral hazy opacities, more focal in the bases, right greater than left. Findings could represent pulmonary edema or multifocal infection. Recommend clinical correlation and follow-up to resolution. Electronically Signed   By: Dorise Bullion III M.D.   On: 06/17/2022 16:57   DG Foot 2 Views Right  Result Date: 06/17/2022 CLINICAL DATA:  Diabetic foot. EXAM: RIGHT FOOT - 2 VIEW COMPARISON:  None Available. FINDINGS: Post amputation midfoot level. Gas ulceration in the stomach. Osseous erosion. Potential skin ulceration posterior to the calcaneus. No subcutaneous gas identified. No osseous erosion. IMPRESSION: No osseous erosions to localize acute osteomyelitis. Electronically Signed   By: Suzy Bouchard M.D.   On: 06/17/2022 14:24   DG Chest 2 View  Result Date: 06/17/2022 CLINICAL DATA:  Suspected Sepsis EXAM: CHEST - 2 VIEW COMPARISON:  July 10, 2021 FINDINGS: The cardiomediastinal silhouette is unchanged in contour.Atherosclerotic calcifications. No pleural effusion. No pneumothorax. No acute pleuroparenchymal abnormality. Visualized abdomen is unremarkable. Multilevel degenerative changes of the thoracic spine. IMPRESSION: No acute cardiopulmonary abnormality. Electronically Signed   By: Valentino Saxon M.D.   On: 06/17/2022 12:51     TODAY-DAY OF DISCHARGE:  Subjective:   Jolaine Click today has no headache,no chest abdominal pain,no new weakness tingling or numbness, feels much better wants to go home today.   Objective:   Blood pressure (!) 154/60, pulse (!) 50, temperature 98.9 F (37.2 C), temperature source Oral, resp. rate (!) 21, height '5\' 4"'$  (1.626 m), weight 113.4 kg, SpO2 97 %.  Intake/Output Summary (Last 24 hours) at 06/22/2022 0937 Last data filed at 06/21/2022 1834 Gross per 24 hour  Intake 169 ml  Output --  Net 169 ml   Filed Weights   06/17/22 1225   Weight: 113.4 kg    Exam: Awake Alert, Oriented *3, No new  F.N deficits, Normal affect [place].AT,PERRAL Supple Neck,No JVD, No cervical lymphadenopathy appriciated.  Symmetrical Chest wall movement, Good air movement bilaterally, CTAB RRR,No Gallops,Rubs or new Murmurs, No Parasternal Heave +ve B.Sounds, Abd Soft, Non tender, No organomegaly appriciated, No rebound -guarding or rigidity. No Cyanosis, Clubbing or edema, No new Rash or bruise   PERTINENT RADIOLOGIC STUDIES: CARDIAC CATHETERIZATION  Result Date: 06/20/2022 FINDINGS: Mean pulmonary artery pressure 30 mmHg consistent with mild pulmonary hypertension with phasic 62/15 mmHg.  Mean pulmonary wedge pressure 24 mmHg.  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 V wave in capillary wedge position averaging 40 mmHg. Mixed venous O2 saturation 53%. PAPI 4.36 consistent with normal RV function. Cardiac output 6.5 L/min with index 3.02 L/min/m.  Pulse oximetry arterial O2 saturation 93% with patient on 3 L/min by cannula. CONCLUSIONS: Findings are consistent left heart failure with pulmonary capillary wedge mean pressure 24 mmHg. Normal cardiac output. Mild pulmonary hypertension Mild elevation in right atrial pressure with normal right heart function based on PAPI.     PERTINENT LAB RESULTS: CBC: Recent Labs    06/20/22 0546 06/20/22 1123 06/21/22 0632  WBC 14.4*  --  12.4*  HGB 8.1* 8.5*  8.8* 8.0*  HCT 25.1* 25.0*  26.0* 24.9*  PLT 369  --  360   CMET CMP     Component Value Date/Time   NA 137 06/21/2022 0632   K 4.0 06/21/2022 0632   CL 106 06/21/2022 0632   CO2 20 (L) 06/21/2022 0632   GLUCOSE 119 (H) 06/21/2022 0632   BUN 73 (H) 06/21/2022 0632   CREATININE 4.04 (H) 06/21/2022 0632   CALCIUM 8.2 (L) 06/21/2022 0632   PROT 5.9 (L) 06/18/2022 0105   ALBUMIN 2.3 (L) 06/18/2022 0105   AST 16 06/18/2022 0105   ALT 7 06/18/2022 0105   ALKPHOS  71 06/18/2022 0105   BILITOT 0.4 06/18/2022 0105   GFRNONAA 11 (L) 06/21/2022 0632   GFRAA 22 (L) 12/25/2017 0411   GFRAA 22 (L) 12/25/2017 0411    GFR Estimated Creatinine Clearance: 15.1 mL/min (A) (by C-G formula based on SCr of 4.04 mg/dL (H)). No results for input(s): "LIPASE", "AMYLASE" in the last 72 hours. No results for input(s): "CKTOTAL", "CKMB", "CKMBINDEX", "TROPONINI" in the last 72 hours. Invalid input(s): "POCBNP" No results for input(s): "DDIMER" in the last 72 hours. No results for input(s): "HGBA1C" in the last 72 hours. No results for input(s): "CHOL", "HDL", "LDLCALC", "TRIG", "CHOLHDL", "LDLDIRECT" in the last 72 hours. No results for input(s): "TSH", "T4TOTAL", "T3FREE", "THYROIDAB" in the last 72 hours.  Invalid input(s): "FREET3" No results for input(s): "VITAMINB12", "FOLATE", "FERRITIN", "TIBC", "IRON", "RETICCTPCT" in the last 72 hours. Coags: No results for input(s): "INR" in the last 72 hours.  Invalid input(s): "PT" Microbiology: Recent Results (from the past 240 hour(s))  Resp Panel by RT-PCR (Flu A&B, Covid) Anterior Nasal Swab     Status: None   Collection Time: 06/17/22  1:07 PM   Specimen: Anterior Nasal Swab  Result Value Ref Range Status   SARS Coronavirus 2 by RT PCR NEGATIVE NEGATIVE Final    Comment: (NOTE) SARS-CoV-2 target nucleic acids are NOT DETECTED.  The SARS-CoV-2 RNA is generally detectable in upper respiratory specimens during the acute phase of infection. The lowest concentration of SARS-CoV-2 viral copies this assay can detect is 138 copies/mL. A negative result does not preclude SARS-Cov-2 infection and should not be used as the sole basis  for treatment or other patient management decisions. A negative result may occur with  improper specimen collection/handling, submission of specimen other than nasopharyngeal swab, presence of viral mutation(s) within the areas targeted by this assay, and inadequate number of  viral copies(<138 copies/mL). A negative result must be combined with clinical observations, patient history, and epidemiological information. The expected result is Negative.  Fact Sheet for Patients:  EntrepreneurPulse.com.au  Fact Sheet for Healthcare Providers:  IncredibleEmployment.be  This test is no t yet approved or cleared by the [place] FDA and  has been authorized for detection and/or diagnosis of SARS-CoV-2 by FDA under an Emergency Use Authorization (EUA). This EUA will remain  in effect (meaning this test can be used) for the duration of the COVID-19 declaration under Section 564(b)(1) of the Act, 21 U.S.C.section 360bbb-3(b)(1), unless the authorization is terminated  or revoked sooner.       Influenza A by PCR NEGATIVE NEGATIVE Final   Influenza B by PCR NEGATIVE NEGATIVE Final    Comment: (NOTE) The Xpert Xpress SARS-CoV-2/FLU/RSV plus assay is intended as an aid in the diagnosis of influenza from Nasopharyngeal swab specimens and should not be used as a sole basis for treatment. Nasal washings and aspirates are unacceptable for Xpert Xpress SARS-CoV-2/FLU/RSV testing.  Fact Sheet for Patients: EntrepreneurPulse.com.au  Fact Sheet for Healthcare Providers: IncredibleEmployment.be  This test is not yet approved or cleared by the [place] FDA and has been authorized for detection and/or diagnosis of SARS-CoV-2 by FDA under an Emergency Use Authorization (EUA). This EUA will remain in effect (meaning this test can be used) for the duration of the COVID-19 declaration under Section 564(b)(1) of the Act, 21 U.S.C. section 360bbb-3(b)(1), unless the authorization is terminated or revoked.  Performed at [facility], [address], [place], [place] [address]   Culture, blood (Routine x 2)     Status: Abnormal   Collection Time: 06/17/22  1:14 PM   Specimen: Right Antecubital;  Blood  Result Value Ref Range Status   Specimen Description   Final    RIGHT ANTECUBITAL BOTTLES DRAWN AEROBIC AND ANAEROBIC Performed at [facility], [address], [place], [place] [address]    Special Requests   Final    Blood Culture adequate volume Performed at [address] [place], [place] [address]    Culture  Setup Time   Final    GRAM POSITIVE COCCI WBC PRESENT,BOTH PMN AND MONONUCLEAR RESULT CALLED TO, READ BACK BY AND VERIFIED WITH: called J. Teffer RN at [place]  on 06/19/22 R.Byrd CRITICAL RESULT CALLED TO, READ BACK BY AND VERIFIED WITH: PHARMD JEREMY F. [id number] FCP ANAEROBIC BOTTLE ONLY Performed at [facility], [place] [address], [place], [place] [address]    Culture PEPTOSTREPTOCOCCUS ASACCHAROLYTICUS (A)  Final   Report Status 06/21/2022 FINAL  Final  Culture, blood (Routine x 2)     Status: None   Collection Time: 06/17/22  1:14 PM   Specimen: Left Antecubital; Blood  Result Value Ref Range Status   Specimen Description   Final    LEFT ANTECUBITAL BOTTLES DRAWN AEROBIC AND ANAEROBIC   Special Requests   Final    Blood Culture results may not be optimal due to an excessive volume of blood received in culture bottles   Culture   Final    NO GROWTH 5 DAYS Performed at [facility], [address], [place], [place] [address]    Report Status 06/22/2022 FINAL  Final  Blood Culture ID Panel (Reflexed)     Status: None   Collection Time: 06/17/22  1:14 PM  Result Value Ref Range Status   Enterococcus faecalis NOT DETECTED NOT DETECTED Final   Enterococcus Faecium NOT DETECTED NOT DETECTED Final   Listeria monocytogenes NOT DETECTED NOT DETECTED Final   Staphylococcus species NOT DETECTED NOT DETECTED Final   Staphylococcus aureus (BCID) NOT DETECTED NOT DETECTED Final   Staphylococcus epidermidis NOT DETECTED NOT DETECTED Final   Staphylococcus lugdunensis NOT DETECTED NOT DETECTED Final   Streptococcus species NOT DETECTED NOT DETECTED  Final   Streptococcus agalactiae NOT DETECTED NOT DETECTED Final   Streptococcus pneumoniae NOT DETECTED NOT DETECTED Final   Streptococcus pyogenes NOT DETECTED NOT DETECTED Final   A.calcoaceticus-baumannii NOT DETECTED NOT DETECTED Final   Bacteroides fragilis NOT DETECTED NOT DETECTED Final   Enterobacterales NOT DETECTED NOT DETECTED Final   Enterobacter cloacae complex NOT DETECTED NOT DETECTED Final   Escherichia coli NOT DETECTED NOT DETECTED Final   Klebsiella aerogenes NOT DETECTED NOT DETECTED Final   Klebsiella oxytoca NOT DETECTED NOT DETECTED Final   Klebsiella pneumoniae NOT DETECTED NOT DETECTED Final   Proteus species NOT DETECTED NOT DETECTED Final   Salmonella species NOT DETECTED NOT DETECTED Final   Serratia marcescens NOT DETECTED NOT DETECTED Final   Haemophilus influenzae NOT DETECTED NOT DETECTED Final   Neisseria meningitidis NOT DETECTED NOT DETECTED Final   Pseudomonas aeruginosa NOT DETECTED NOT DETECTED Final   Stenotrophomonas maltophilia NOT DETECTED NOT DETECTED Final   Candida albicans NOT DETECTED NOT DETECTED Final   Candida auris NOT DETECTED NOT DETECTED Final   Candida glabrata NOT DETECTED NOT DETECTED Final   Candida krusei NOT DETECTED NOT DETECTED Final   Candida parapsilosis NOT DETECTED NOT DETECTED Final   Candida tropicalis NOT DETECTED NOT DETECTED Final   Cryptococcus neoformans/gattii NOT DETECTED NOT DETECTED Final    Comment: Performed at [facility], [address] [place], [place] [address]  Urine Culture     Status: Abnormal   Collection Time: 06/17/22  8:38 PM   Specimen: In/Out Cath Urine  Result Value Ref Range Status   Specimen Description IN/OUT CATH URINE  Final   Special Requests   Final    NONE Performed at [facility], [place] [address], [place], [place] [address]    Culture 10,000 COLONIES/mL ESCHERICHIA COLI (A)  Final   Report Status 06/20/2022 FINAL  Final   Organism ID, Bacteria ESCHERICHIA COLI (A)   Final      Susceptibility   Escherichia coli - MIC*    AMPICILLIN <=2 SENSITIVE Sensitive     CEFAZOLIN <=4 SENSITIVE Sensitive     CEFEPIME <=0.12 SENSITIVE Sensitive     CEFTRIAXONE <=0.25 SENSITIVE Sensitive     CIPROFLOXACIN <=0.25 SENSITIVE Sensitive     GENTAMICIN <=1 SENSITIVE Sensitive     IMIPENEM <=0.25 SENSITIVE Sensitive     NITROFURANTOIN <=16 SENSITIVE Sensitive     TRIMETH/SULFA <=20 SENSITIVE Sensitive     AMPICILLIN/SULBACTAM <=2 SENSITIVE Sensitive     PIP/TAZO <=4 SENSITIVE Sensitive     * 10,000 COLONIES/mL ESCHERICHIA COLI  MRSA Next Gen by PCR, Nasal     Status: Abnormal   Collection Time: 06/19/22  5:23 PM   Specimen: Nasal Mucosa; Nasal Swab  Result Value Ref Range Status   MRSA by PCR Next Gen DETECTED (A) NOT DETECTED Final    Comment: RESULT CALLED TO, READ BACK BY AND VERIFIED WITH: RN  A. LOWE [id number] '@[id number]'$  FH (NOTE) The GeneXpert MRSA Assay (FDA approved for NASAL specimens only), is one component of a comprehensive MRSA colonization surveillance program. It is not intended to diagnose MRSA infection nor to guide or monitor treatment for MRSA infections. Test performance is not FDA approved in patients less than 63 years old. Performed at [facility], [place] [address] [place], [place] [address]     FURTHER DISCHARGE INSTRUCTIONS:  Get Medicines reviewed and adjusted: Please take all your medications with you for your next visit with your Primary MD  Laboratory/radiological data: Please request your Primary MD to go over all hospital tests and procedure/radiological results at the follow up, please ask your Primary MD to get all Hospital records sent to his/her office.  In some cases, they will be blood work, cultures and biopsy results pending at the time of your discharge. Please request that your primary care M.D. goes through all the records of your hospital data and follows up on these results.  Also Note the following: If you  experience worsening of your admission symptoms, develop shortness of breath, life threatening emergency, suicidal or homicidal thoughts you must seek medical attention immediately by calling 911 or calling your MD immediately  if symptoms less severe.  You must read complete instructions/literature along with all the possible adverse reactions/side effects for all the Medicines you take and that have been prescribed to you. Take any new Medicines after you have completely understood and accpet all the possible adverse reactions/side effects.   Do not drive when taking Pain medications or sleeping medications (Benzodaizepines)  Do not take more than prescribed Pain, Sleep and Anxiety Medications. It is not advisable to combine anxiety,sleep and pain medications without talking with your primary care practitioner  Special Instructions: If you have smoked or chewed Tobacco  in the last 2 yrs please stop smoking, stop any regular Alcohol  and or any Recreational drug use.  Wear Seat belts while driving.  Please note: You were cared for by a hospitalist during your hospital stay. Once you are discharged, your primary care physician will handle any further medical issues. Please note that NO REFILLS for any discharge medications will be authorized once you are discharged, as it is imperative that you return to your primary care physician (or establish a relationship with a primary care physician if you do not have one) for your post hospital discharge needs so that they can reassess your need for medications and monitor your lab values.  Total Time spent coordinating discharge including counseling, education and face to face time equals greater than 30 minutes.  SignedOren Binet 06/22/2022 9:37 AM

## 2022-06-23 DIAGNOSIS — G8929 Other chronic pain: Secondary | ICD-10-CM | POA: Diagnosis not present

## 2022-06-23 DIAGNOSIS — M86271 Subacute osteomyelitis, right ankle and foot: Secondary | ICD-10-CM | POA: Diagnosis not present

## 2022-06-23 DIAGNOSIS — I480 Paroxysmal atrial fibrillation: Secondary | ICD-10-CM | POA: Diagnosis not present

## 2022-06-23 DIAGNOSIS — E119 Type 2 diabetes mellitus without complications: Secondary | ICD-10-CM | POA: Diagnosis not present

## 2022-06-23 DIAGNOSIS — I10 Essential (primary) hypertension: Secondary | ICD-10-CM | POA: Diagnosis not present

## 2022-06-23 DIAGNOSIS — N179 Acute kidney failure, unspecified: Secondary | ICD-10-CM | POA: Diagnosis not present

## 2022-06-23 DIAGNOSIS — E785 Hyperlipidemia, unspecified: Secondary | ICD-10-CM | POA: Diagnosis not present

## 2022-06-23 DIAGNOSIS — I5032 Chronic diastolic (congestive) heart failure: Secondary | ICD-10-CM | POA: Diagnosis not present

## 2022-06-23 DIAGNOSIS — A419 Sepsis, unspecified organism: Secondary | ICD-10-CM | POA: Diagnosis not present

## 2022-06-23 LAB — LIPOPROTEIN A (LPA): Lipoprotein (a): 76 nmol/L — ABNORMAL HIGH (ref <75.0)

## 2022-06-25 DIAGNOSIS — I480 Paroxysmal atrial fibrillation: Secondary | ICD-10-CM | POA: Diagnosis not present

## 2022-06-25 DIAGNOSIS — I5032 Chronic diastolic (congestive) heart failure: Secondary | ICD-10-CM | POA: Diagnosis not present

## 2022-06-25 DIAGNOSIS — I10 Essential (primary) hypertension: Secondary | ICD-10-CM | POA: Diagnosis not present

## 2022-06-25 DIAGNOSIS — E785 Hyperlipidemia, unspecified: Secondary | ICD-10-CM | POA: Diagnosis not present

## 2022-06-25 DIAGNOSIS — A419 Sepsis, unspecified organism: Secondary | ICD-10-CM | POA: Diagnosis not present

## 2022-06-25 DIAGNOSIS — M86271 Subacute osteomyelitis, right ankle and foot: Secondary | ICD-10-CM | POA: Diagnosis not present

## 2022-06-27 DIAGNOSIS — I10 Essential (primary) hypertension: Secondary | ICD-10-CM | POA: Diagnosis not present

## 2022-06-27 DIAGNOSIS — E785 Hyperlipidemia, unspecified: Secondary | ICD-10-CM | POA: Diagnosis not present

## 2022-06-27 DIAGNOSIS — I5032 Chronic diastolic (congestive) heart failure: Secondary | ICD-10-CM | POA: Diagnosis not present

## 2022-06-27 DIAGNOSIS — I480 Paroxysmal atrial fibrillation: Secondary | ICD-10-CM | POA: Diagnosis not present

## 2022-06-27 DIAGNOSIS — A419 Sepsis, unspecified organism: Secondary | ICD-10-CM | POA: Diagnosis not present

## 2022-06-27 DIAGNOSIS — M86271 Subacute osteomyelitis, right ankle and foot: Secondary | ICD-10-CM | POA: Diagnosis not present

## 2022-06-29 DIAGNOSIS — N179 Acute kidney failure, unspecified: Secondary | ICD-10-CM | POA: Diagnosis not present

## 2022-06-29 DIAGNOSIS — E119 Type 2 diabetes mellitus without complications: Secondary | ICD-10-CM | POA: Diagnosis not present

## 2022-06-29 DIAGNOSIS — A419 Sepsis, unspecified organism: Secondary | ICD-10-CM | POA: Diagnosis not present

## 2022-06-29 DIAGNOSIS — I480 Paroxysmal atrial fibrillation: Secondary | ICD-10-CM | POA: Diagnosis not present

## 2022-06-29 DIAGNOSIS — M86271 Subacute osteomyelitis, right ankle and foot: Secondary | ICD-10-CM | POA: Diagnosis not present

## 2022-06-29 DIAGNOSIS — I10 Essential (primary) hypertension: Secondary | ICD-10-CM | POA: Diagnosis not present

## 2022-06-29 DIAGNOSIS — I5032 Chronic diastolic (congestive) heart failure: Secondary | ICD-10-CM | POA: Diagnosis not present

## 2022-06-29 DIAGNOSIS — G8929 Other chronic pain: Secondary | ICD-10-CM | POA: Diagnosis not present

## 2022-06-29 DIAGNOSIS — E785 Hyperlipidemia, unspecified: Secondary | ICD-10-CM | POA: Diagnosis not present

## 2022-07-02 DIAGNOSIS — I10 Essential (primary) hypertension: Secondary | ICD-10-CM | POA: Diagnosis not present

## 2022-07-02 DIAGNOSIS — I480 Paroxysmal atrial fibrillation: Secondary | ICD-10-CM | POA: Diagnosis not present

## 2022-07-02 DIAGNOSIS — I5032 Chronic diastolic (congestive) heart failure: Secondary | ICD-10-CM | POA: Diagnosis not present

## 2022-07-02 DIAGNOSIS — A419 Sepsis, unspecified organism: Secondary | ICD-10-CM | POA: Diagnosis not present

## 2022-07-02 DIAGNOSIS — M86271 Subacute osteomyelitis, right ankle and foot: Secondary | ICD-10-CM | POA: Diagnosis not present

## 2022-07-02 DIAGNOSIS — E785 Hyperlipidemia, unspecified: Secondary | ICD-10-CM | POA: Diagnosis not present

## 2022-07-03 DIAGNOSIS — I480 Paroxysmal atrial fibrillation: Secondary | ICD-10-CM | POA: Diagnosis not present

## 2022-07-03 DIAGNOSIS — I5032 Chronic diastolic (congestive) heart failure: Secondary | ICD-10-CM | POA: Diagnosis not present

## 2022-07-03 DIAGNOSIS — M86271 Subacute osteomyelitis, right ankle and foot: Secondary | ICD-10-CM | POA: Diagnosis not present

## 2022-07-03 DIAGNOSIS — E785 Hyperlipidemia, unspecified: Secondary | ICD-10-CM | POA: Diagnosis not present

## 2022-07-03 DIAGNOSIS — A419 Sepsis, unspecified organism: Secondary | ICD-10-CM | POA: Diagnosis not present

## 2022-07-03 DIAGNOSIS — I10 Essential (primary) hypertension: Secondary | ICD-10-CM | POA: Diagnosis not present

## 2022-07-04 DIAGNOSIS — M86271 Subacute osteomyelitis, right ankle and foot: Secondary | ICD-10-CM | POA: Diagnosis not present

## 2022-07-04 DIAGNOSIS — I5032 Chronic diastolic (congestive) heart failure: Secondary | ICD-10-CM | POA: Diagnosis not present

## 2022-07-04 DIAGNOSIS — A419 Sepsis, unspecified organism: Secondary | ICD-10-CM | POA: Diagnosis not present

## 2022-07-04 DIAGNOSIS — I480 Paroxysmal atrial fibrillation: Secondary | ICD-10-CM | POA: Diagnosis not present

## 2022-07-04 DIAGNOSIS — I10 Essential (primary) hypertension: Secondary | ICD-10-CM | POA: Diagnosis not present

## 2022-07-04 DIAGNOSIS — E785 Hyperlipidemia, unspecified: Secondary | ICD-10-CM | POA: Diagnosis not present

## 2022-07-09 DIAGNOSIS — E785 Hyperlipidemia, unspecified: Secondary | ICD-10-CM | POA: Diagnosis not present

## 2022-07-09 DIAGNOSIS — A419 Sepsis, unspecified organism: Secondary | ICD-10-CM | POA: Diagnosis not present

## 2022-07-09 DIAGNOSIS — M86271 Subacute osteomyelitis, right ankle and foot: Secondary | ICD-10-CM | POA: Diagnosis not present

## 2022-07-09 DIAGNOSIS — I10 Essential (primary) hypertension: Secondary | ICD-10-CM | POA: Diagnosis not present

## 2022-07-09 DIAGNOSIS — I5032 Chronic diastolic (congestive) heart failure: Secondary | ICD-10-CM | POA: Diagnosis not present

## 2022-07-09 DIAGNOSIS — I480 Paroxysmal atrial fibrillation: Secondary | ICD-10-CM | POA: Diagnosis not present

## 2022-07-10 DIAGNOSIS — I480 Paroxysmal atrial fibrillation: Secondary | ICD-10-CM | POA: Diagnosis not present

## 2022-07-10 DIAGNOSIS — I5032 Chronic diastolic (congestive) heart failure: Secondary | ICD-10-CM | POA: Diagnosis not present

## 2022-07-10 DIAGNOSIS — A419 Sepsis, unspecified organism: Secondary | ICD-10-CM | POA: Diagnosis not present

## 2022-07-10 DIAGNOSIS — E785 Hyperlipidemia, unspecified: Secondary | ICD-10-CM | POA: Diagnosis not present

## 2022-07-10 DIAGNOSIS — M86271 Subacute osteomyelitis, right ankle and foot: Secondary | ICD-10-CM | POA: Diagnosis not present

## 2022-07-10 DIAGNOSIS — I10 Essential (primary) hypertension: Secondary | ICD-10-CM | POA: Diagnosis not present

## 2022-07-11 DIAGNOSIS — I5032 Chronic diastolic (congestive) heart failure: Secondary | ICD-10-CM | POA: Diagnosis not present

## 2022-07-11 DIAGNOSIS — I10 Essential (primary) hypertension: Secondary | ICD-10-CM | POA: Diagnosis not present

## 2022-07-11 DIAGNOSIS — E785 Hyperlipidemia, unspecified: Secondary | ICD-10-CM | POA: Diagnosis not present

## 2022-07-11 DIAGNOSIS — M86271 Subacute osteomyelitis, right ankle and foot: Secondary | ICD-10-CM | POA: Diagnosis not present

## 2022-07-11 DIAGNOSIS — I480 Paroxysmal atrial fibrillation: Secondary | ICD-10-CM | POA: Diagnosis not present

## 2022-07-11 DIAGNOSIS — A419 Sepsis, unspecified organism: Secondary | ICD-10-CM | POA: Diagnosis not present

## 2022-07-12 DIAGNOSIS — I5032 Chronic diastolic (congestive) heart failure: Secondary | ICD-10-CM | POA: Diagnosis not present

## 2022-07-12 DIAGNOSIS — I10 Essential (primary) hypertension: Secondary | ICD-10-CM | POA: Diagnosis not present

## 2022-07-12 DIAGNOSIS — M86271 Subacute osteomyelitis, right ankle and foot: Secondary | ICD-10-CM | POA: Diagnosis not present

## 2022-07-12 DIAGNOSIS — E785 Hyperlipidemia, unspecified: Secondary | ICD-10-CM | POA: Diagnosis not present

## 2022-07-12 DIAGNOSIS — A419 Sepsis, unspecified organism: Secondary | ICD-10-CM | POA: Diagnosis not present

## 2022-07-12 DIAGNOSIS — I480 Paroxysmal atrial fibrillation: Secondary | ICD-10-CM | POA: Diagnosis not present

## 2022-07-13 DIAGNOSIS — E785 Hyperlipidemia, unspecified: Secondary | ICD-10-CM | POA: Diagnosis not present

## 2022-07-13 DIAGNOSIS — I480 Paroxysmal atrial fibrillation: Secondary | ICD-10-CM | POA: Diagnosis not present

## 2022-07-13 DIAGNOSIS — I5032 Chronic diastolic (congestive) heart failure: Secondary | ICD-10-CM | POA: Diagnosis not present

## 2022-07-13 DIAGNOSIS — I10 Essential (primary) hypertension: Secondary | ICD-10-CM | POA: Diagnosis not present

## 2022-07-13 DIAGNOSIS — M86271 Subacute osteomyelitis, right ankle and foot: Secondary | ICD-10-CM | POA: Diagnosis not present

## 2022-07-13 DIAGNOSIS — A419 Sepsis, unspecified organism: Secondary | ICD-10-CM | POA: Diagnosis not present

## 2022-07-16 DIAGNOSIS — I5032 Chronic diastolic (congestive) heart failure: Secondary | ICD-10-CM | POA: Diagnosis not present

## 2022-07-16 DIAGNOSIS — E785 Hyperlipidemia, unspecified: Secondary | ICD-10-CM | POA: Diagnosis not present

## 2022-07-16 DIAGNOSIS — M86271 Subacute osteomyelitis, right ankle and foot: Secondary | ICD-10-CM | POA: Diagnosis not present

## 2022-07-16 DIAGNOSIS — I480 Paroxysmal atrial fibrillation: Secondary | ICD-10-CM | POA: Diagnosis not present

## 2022-07-16 DIAGNOSIS — A419 Sepsis, unspecified organism: Secondary | ICD-10-CM | POA: Diagnosis not present

## 2022-07-16 DIAGNOSIS — I10 Essential (primary) hypertension: Secondary | ICD-10-CM | POA: Diagnosis not present

## 2022-07-17 DIAGNOSIS — I5032 Chronic diastolic (congestive) heart failure: Secondary | ICD-10-CM | POA: Diagnosis not present

## 2022-07-17 DIAGNOSIS — E785 Hyperlipidemia, unspecified: Secondary | ICD-10-CM | POA: Diagnosis not present

## 2022-07-17 DIAGNOSIS — A419 Sepsis, unspecified organism: Secondary | ICD-10-CM | POA: Diagnosis not present

## 2022-07-17 DIAGNOSIS — M86271 Subacute osteomyelitis, right ankle and foot: Secondary | ICD-10-CM | POA: Diagnosis not present

## 2022-07-17 DIAGNOSIS — I10 Essential (primary) hypertension: Secondary | ICD-10-CM | POA: Diagnosis not present

## 2022-07-17 DIAGNOSIS — I480 Paroxysmal atrial fibrillation: Secondary | ICD-10-CM | POA: Diagnosis not present

## 2022-07-18 ENCOUNTER — Encounter (INDEPENDENT_AMBULATORY_CARE_PROVIDER_SITE_OTHER): Payer: Medicare Other | Admitting: Ophthalmology

## 2022-07-18 DIAGNOSIS — I5032 Chronic diastolic (congestive) heart failure: Secondary | ICD-10-CM | POA: Diagnosis not present

## 2022-07-18 DIAGNOSIS — I480 Paroxysmal atrial fibrillation: Secondary | ICD-10-CM | POA: Diagnosis not present

## 2022-07-18 DIAGNOSIS — E785 Hyperlipidemia, unspecified: Secondary | ICD-10-CM | POA: Diagnosis not present

## 2022-07-18 DIAGNOSIS — M86271 Subacute osteomyelitis, right ankle and foot: Secondary | ICD-10-CM | POA: Diagnosis not present

## 2022-07-18 DIAGNOSIS — A419 Sepsis, unspecified organism: Secondary | ICD-10-CM | POA: Diagnosis not present

## 2022-07-18 DIAGNOSIS — I10 Essential (primary) hypertension: Secondary | ICD-10-CM | POA: Diagnosis not present

## 2022-07-20 DIAGNOSIS — I5032 Chronic diastolic (congestive) heart failure: Secondary | ICD-10-CM | POA: Diagnosis not present

## 2022-07-20 DIAGNOSIS — A419 Sepsis, unspecified organism: Secondary | ICD-10-CM | POA: Diagnosis not present

## 2022-07-20 DIAGNOSIS — M86271 Subacute osteomyelitis, right ankle and foot: Secondary | ICD-10-CM | POA: Diagnosis not present

## 2022-07-20 DIAGNOSIS — I10 Essential (primary) hypertension: Secondary | ICD-10-CM | POA: Diagnosis not present

## 2022-07-20 DIAGNOSIS — E785 Hyperlipidemia, unspecified: Secondary | ICD-10-CM | POA: Diagnosis not present

## 2022-07-20 DIAGNOSIS — I480 Paroxysmal atrial fibrillation: Secondary | ICD-10-CM | POA: Diagnosis not present

## 2022-07-24 DIAGNOSIS — I480 Paroxysmal atrial fibrillation: Secondary | ICD-10-CM | POA: Diagnosis not present

## 2022-07-24 DIAGNOSIS — I10 Essential (primary) hypertension: Secondary | ICD-10-CM | POA: Diagnosis not present

## 2022-07-24 DIAGNOSIS — I5032 Chronic diastolic (congestive) heart failure: Secondary | ICD-10-CM | POA: Diagnosis not present

## 2022-07-24 DIAGNOSIS — M86271 Subacute osteomyelitis, right ankle and foot: Secondary | ICD-10-CM | POA: Diagnosis not present

## 2022-07-24 DIAGNOSIS — E785 Hyperlipidemia, unspecified: Secondary | ICD-10-CM | POA: Diagnosis not present

## 2022-07-24 DIAGNOSIS — A419 Sepsis, unspecified organism: Secondary | ICD-10-CM | POA: Diagnosis not present

## 2022-07-25 DIAGNOSIS — I5032 Chronic diastolic (congestive) heart failure: Secondary | ICD-10-CM | POA: Diagnosis not present

## 2022-07-25 DIAGNOSIS — I10 Essential (primary) hypertension: Secondary | ICD-10-CM | POA: Diagnosis not present

## 2022-07-25 DIAGNOSIS — E785 Hyperlipidemia, unspecified: Secondary | ICD-10-CM | POA: Diagnosis not present

## 2022-07-25 DIAGNOSIS — I480 Paroxysmal atrial fibrillation: Secondary | ICD-10-CM | POA: Diagnosis not present

## 2022-07-25 DIAGNOSIS — M86271 Subacute osteomyelitis, right ankle and foot: Secondary | ICD-10-CM | POA: Diagnosis not present

## 2022-07-25 DIAGNOSIS — A419 Sepsis, unspecified organism: Secondary | ICD-10-CM | POA: Diagnosis not present

## 2022-07-27 DIAGNOSIS — I480 Paroxysmal atrial fibrillation: Secondary | ICD-10-CM | POA: Diagnosis not present

## 2022-07-27 DIAGNOSIS — I5032 Chronic diastolic (congestive) heart failure: Secondary | ICD-10-CM | POA: Diagnosis not present

## 2022-07-27 DIAGNOSIS — M86271 Subacute osteomyelitis, right ankle and foot: Secondary | ICD-10-CM | POA: Diagnosis not present

## 2022-07-27 DIAGNOSIS — I10 Essential (primary) hypertension: Secondary | ICD-10-CM | POA: Diagnosis not present

## 2022-07-27 DIAGNOSIS — E785 Hyperlipidemia, unspecified: Secondary | ICD-10-CM | POA: Diagnosis not present

## 2022-07-27 DIAGNOSIS — A419 Sepsis, unspecified organism: Secondary | ICD-10-CM | POA: Diagnosis not present

## 2022-10-29 DEATH — deceased

## 2023-04-30 IMAGING — DX DG CHEST 2V
2 series · 2 of 2 positions shown · non-contrast
Comparison: 04/28/2009.

CLINICAL DATA: Cough, congestion.

EXAM:
CHEST - 2 VIEW

[chest lat]
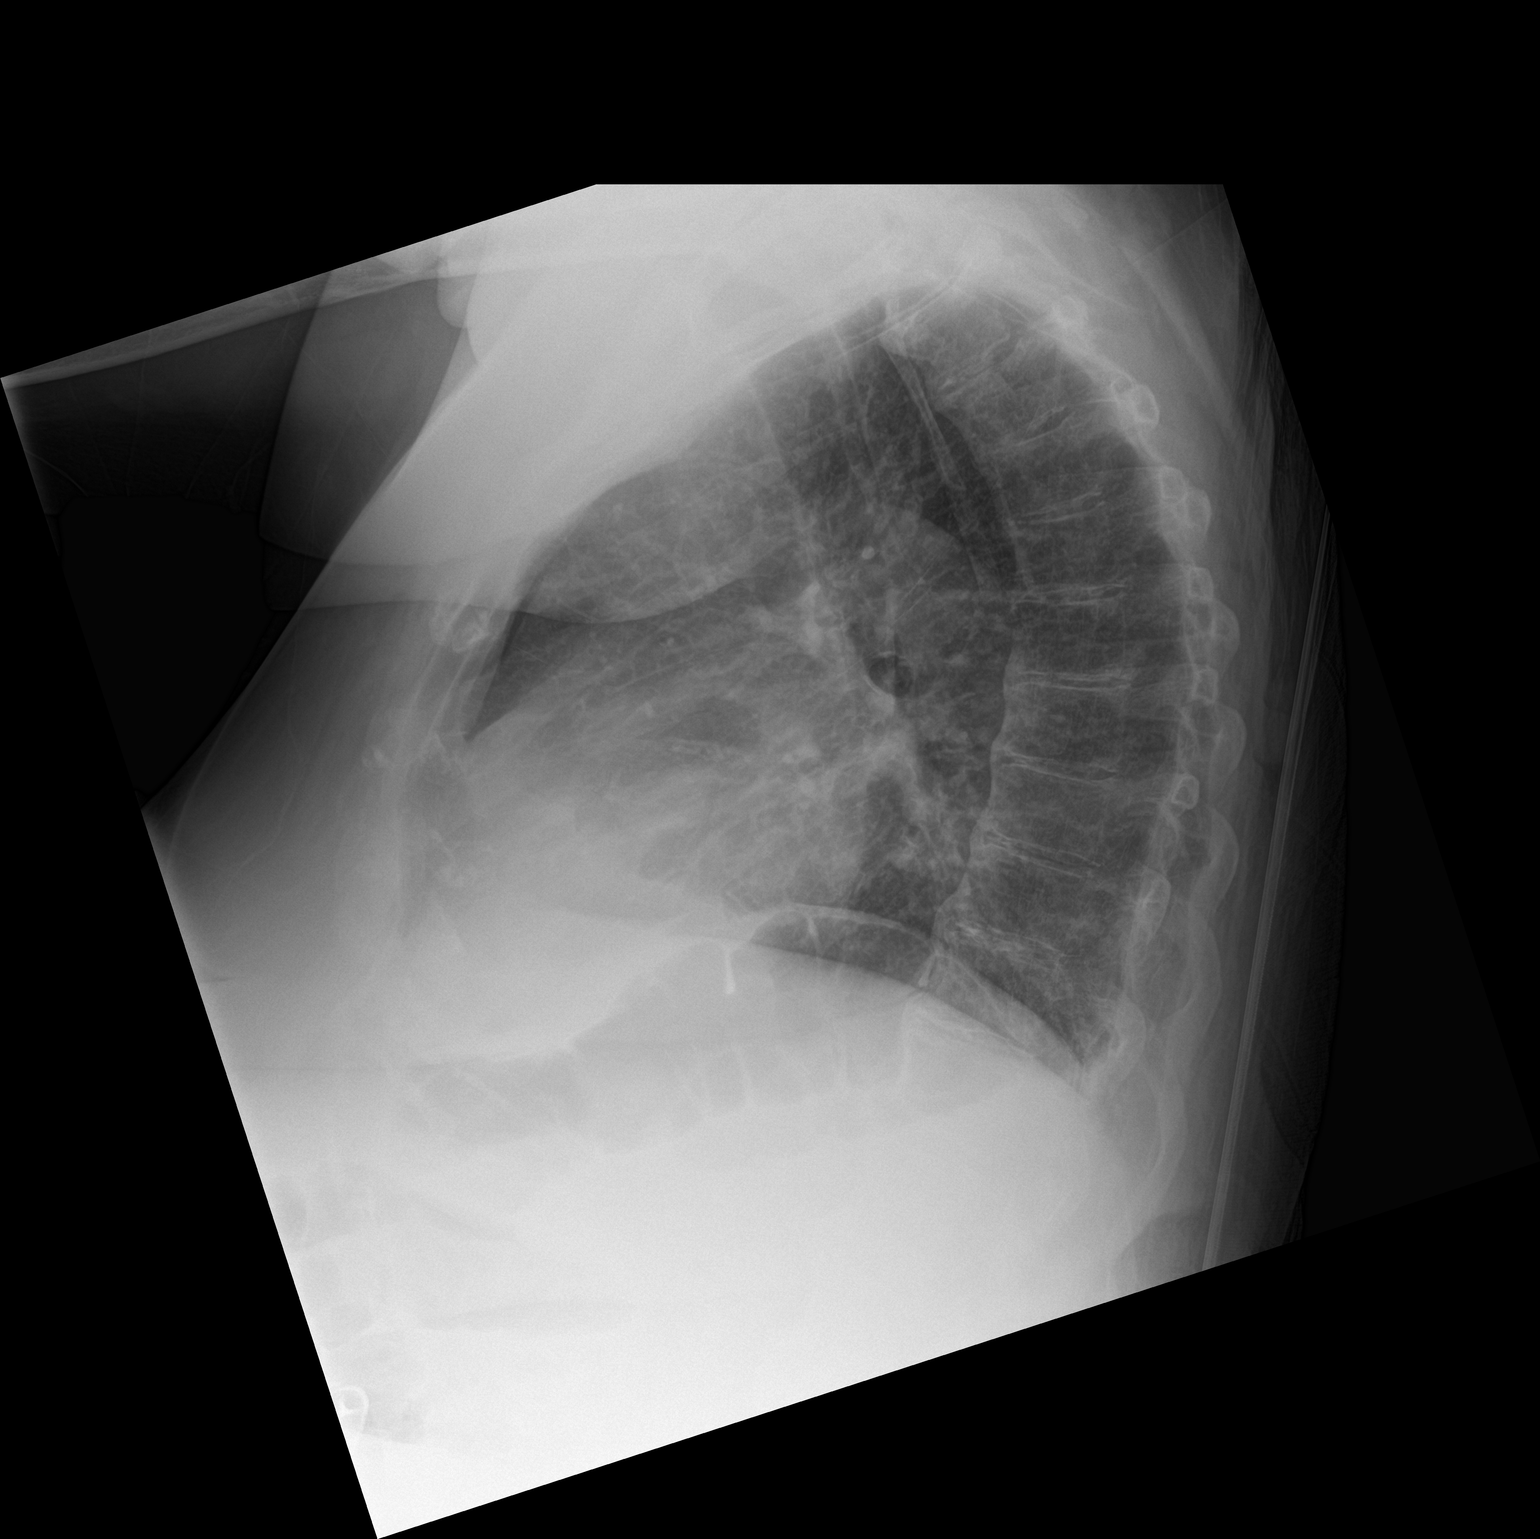

[chest ap]
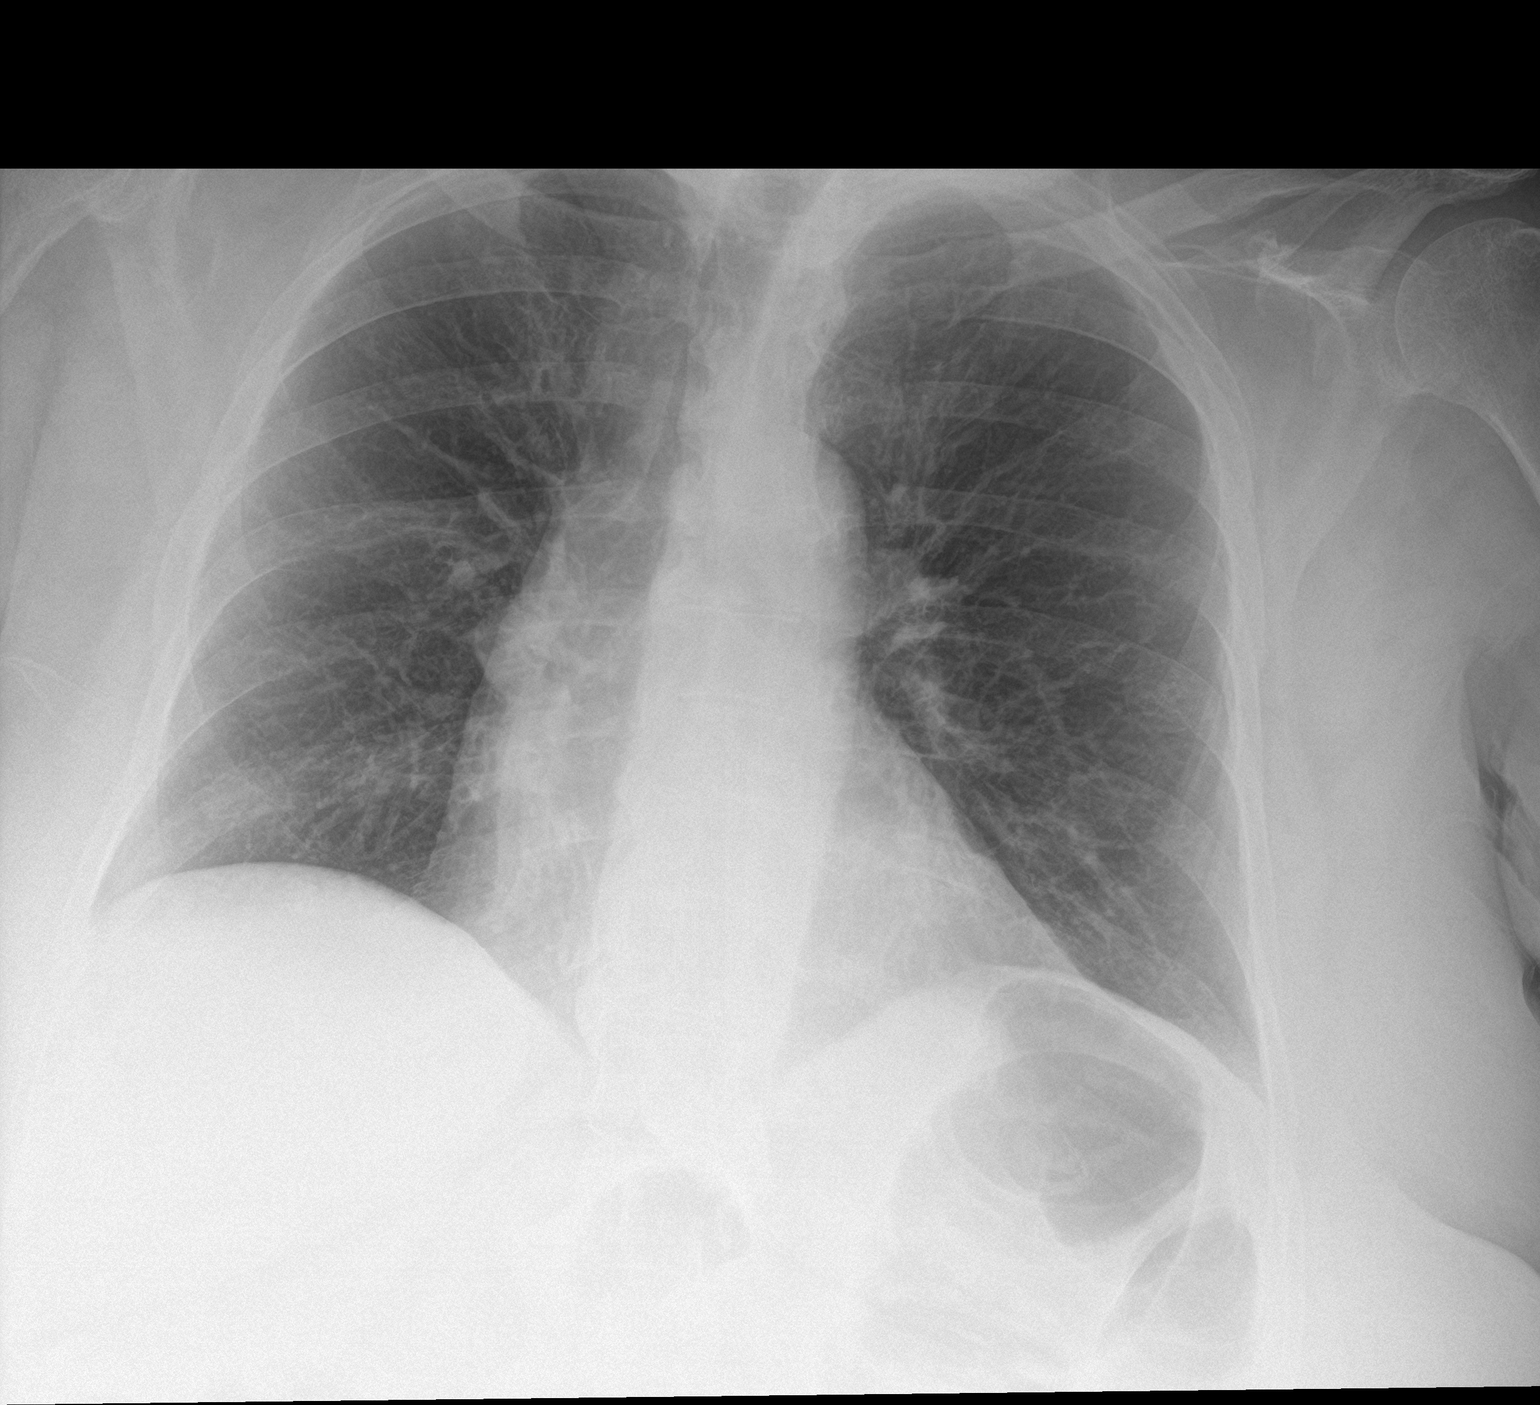

[2 of 2 positions shown; findings below may reference images not displayed]

FINDINGS: Trachea is midline. Heart is at the upper limits of normal in size.
Lungs are clear. No airspace consolidation or pleural fluid.
IMPRESSION: No acute findings.

## 2024-02-14 ENCOUNTER — Encounter: Payer: Self-pay | Admitting: Advanced Practice Midwife
